# Patient Record
Sex: Female | Born: 1935 | Race: White | Hispanic: No | Smoking: Former smoker
Health system: Southern US, Community
[De-identification: ages and names within clinical notes are randomized; demographics above are authoritative.]

## PROBLEM LIST (undated history)

## (undated) DIAGNOSIS — K449 Diaphragmatic hernia without obstruction or gangrene: Secondary | ICD-10-CM

## (undated) DIAGNOSIS — I714 Abdominal aortic aneurysm, without rupture, unspecified: Secondary | ICD-10-CM

## (undated) DIAGNOSIS — H269 Unspecified cataract: Secondary | ICD-10-CM

## (undated) DIAGNOSIS — J449 Chronic obstructive pulmonary disease, unspecified: Secondary | ICD-10-CM

## (undated) DIAGNOSIS — I10 Essential (primary) hypertension: Secondary | ICD-10-CM

## (undated) DIAGNOSIS — Z8719 Personal history of other diseases of the digestive system: Secondary | ICD-10-CM

## (undated) DIAGNOSIS — M810 Age-related osteoporosis without current pathological fracture: Secondary | ICD-10-CM

## (undated) DIAGNOSIS — I739 Peripheral vascular disease, unspecified: Secondary | ICD-10-CM

## (undated) DIAGNOSIS — I2510 Atherosclerotic heart disease of native coronary artery without angina pectoris: Secondary | ICD-10-CM

## (undated) DIAGNOSIS — W19XXXA Unspecified fall, initial encounter: Secondary | ICD-10-CM

## (undated) DIAGNOSIS — C3490 Malignant neoplasm of unspecified part of unspecified bronchus or lung: Secondary | ICD-10-CM

## (undated) DIAGNOSIS — K219 Gastro-esophageal reflux disease without esophagitis: Secondary | ICD-10-CM

## (undated) DIAGNOSIS — Z5111 Encounter for antineoplastic chemotherapy: Secondary | ICD-10-CM

## (undated) HISTORY — DX: Unspecified fall, initial encounter: W19.XXXA

## (undated) HISTORY — DX: Encounter for antineoplastic chemotherapy: Z51.11

## (undated) HISTORY — DX: Gastro-esophageal reflux disease without esophagitis: K21.9

## (undated) HISTORY — DX: Abdominal aortic aneurysm, without rupture, unspecified: I71.40

## (undated) HISTORY — DX: Essential (primary) hypertension: I10

## (undated) HISTORY — DX: Unspecified cataract: H26.9

## (undated) HISTORY — PX: CATARACT EXTRACTION: SUR2

## (undated) HISTORY — PX: COLONOSCOPY: SHX174

## (undated) HISTORY — PX: ESOPHAGOGASTRODUODENOSCOPY (EGD) WITH ESOPHAGEAL DILATION: SHX5812

## (undated) HISTORY — DX: Diaphragmatic hernia without obstruction or gangrene: K44.9

## (undated) HISTORY — DX: Age-related osteoporosis without current pathological fracture: M81.0

## (undated) HISTORY — PX: EYE SURGERY: SHX253

## (undated) HISTORY — DX: Personal history of other diseases of the digestive system: Z87.19

## (undated) HISTORY — DX: Abdominal aortic aneurysm, without rupture: I71.4

## (undated) HISTORY — DX: Peripheral vascular disease, unspecified: I73.9

---

## 2012-08-30 ENCOUNTER — Other Ambulatory Visit (HOSPITAL_COMMUNITY): Payer: Self-pay | Admitting: Gastroenterology

## 2012-08-30 DIAGNOSIS — R1310 Dysphagia, unspecified: Secondary | ICD-10-CM

## 2012-09-11 ENCOUNTER — Ambulatory Visit (HOSPITAL_COMMUNITY)
Admission: RE | Admit: 2012-09-11 | Discharge: 2012-09-11 | Disposition: A | Discharge status: Home / Self Care | Payer: Medicare Other | Source: Ambulatory Visit | Attending: Gastroenterology | Admitting: Gastroenterology

## 2012-09-11 ENCOUNTER — Other Ambulatory Visit (HOSPITAL_COMMUNITY): Payer: Self-pay | Admitting: Gastroenterology

## 2012-09-11 DIAGNOSIS — R1310 Dysphagia, unspecified: Secondary | ICD-10-CM

## 2012-09-11 NOTE — Procedures (Signed)
Objective Swallowing Evaluation: Modified Barium Swallowing Study  Patient Details  Name: Carol [name] MRN: [id number] Date of Birth: 1932/07/09  Today's Date: 09/11/2012 Time: 1305-1340 SLP Time Calculation (min): 35 min  Past Medical History: No past medical history on file. Past Surgical History: No past surgical history on file. HPI:  78 yo recently moved from [place] to live with her daughter, referred by Dr Bosie Clos for MBS due to pt coughing during or after meals possibly indicating aspiration.  Pt PMH + for HTN, smoking (1/2 PPD currently).  Per documentation from GI, pt reports problems swallowing foods - (twice with steak) causing gagging and choking.  Pt denies ever requiring heimlich manuever.  Pt denies weight loss, pulmonary infections or GERD symptoms as well.  She does report dentures are ill fitting and she needs them refitted.     Assessment / Plan / Recommendation Clinical Impression  Dysphagia Diagnosis: Mild pharyngeal phase dysphagia Clinical impression: Pt presents with minimal oropharyngeal dysphagia without aspiration of any consistency tested.  Minimal sensorimotor deficits in pharynx noted resulting in intermittent trace laryngeal penetration of thin due to minimally decr laryngeal elevation/closure.  Cued throat clear removed trace penetrates and chin tuck eliminated penetration.   Chin tuck posture may be beneficial in future if pt notes overtly coughing with liquids.  Pharyngeal swallow was strong without resdiuals.  Minimal thin oral residue spilled into pharynx without reflexive swallow- clearing with cued swallow.  Rec regular/thin diet with general asp and esophageal precautions.  Xerostomia tips provided as pt reports occasional dry mouth - especially at night.  Pt did not cough once during entire evaluation.  Thanks for this referral.      Treatment Recommendation  No treatment recommended at this time    Diet Recommendation Thin liquid;Regular   Liquid  Administration via: Cup Medication Administration:  (as tolerated) Supervision: Patient able to self feed Compensations: Slow rate;Small sips/bites;Clear throat intermittently (intermittent dry swallow-) Postural Changes and/or Swallow Maneuvers: Upright 30-60 min after meal;Seated upright 90 degrees    Other  Recommendations Oral Care Recommendations: Oral care BID   Follow Up Recommendations  None    Frequency and Duration        Pertinent Vitals/Pain N/a, pt ambulating well, on room air    SLP Swallow Goals     General Date of Onset: 09/11/12 HPI: 78 yo recently moved from [place] to live with her daughter, referred by Dr Bosie Clos for MBS due to pt coughing during or after meals possibly indicating aspiration.  Pt PMH + for HTN, smoking (1/2 PPD currently).  Per documentation from GI, pt reports problems swallowing foods - (twice with steak) causing gagging and choking.  Pt denies ever requiring heimlich manuever.  Pt denies weight loss or pulmonary infections.   Type of Study: Modified Barium Swallowing Study Reason for Referral: Objectively evaluate swallowing function Diet Prior to this Study: Regular;Thin liquids Temperature Spikes Noted: No Respiratory Status: Room air History of Recent Intubation: No Behavior/Cognition: Alert;Cooperative;Pleasant mood Oral Cavity - Dentition: Dentures, top;Dentures, bottom Oral Motor / Sensory Function: Within functional limits (oral cavity was mildly erythemic) Self-Feeding Abilities: Able to feed self Patient Positioning: Upright in chair Baseline Vocal Quality: Clear Volitional Cough: Strong Volitional Swallow: Able to elicit Anatomy: Within functional limits Pharyngeal Secretions: Not observed secondary MBS    Reason for Referral Objectively evaluate swallowing function   Oral Phase Oral Preparation/Oral Phase Oral Phase: Impaired Oral Phase - Comment Oral Phase - Comment: trace oral liquid resdiuals prematurely spilled into  pharynx without pt awareness - cued dry swallows cleared-    Pharyngeal Phase Pharyngeal Phase Pharyngeal Phase: Impaired Pharyngeal - Nectar Pharyngeal - Nectar Cup: Within functional limits Pharyngeal - Thin Pharyngeal - Thin Cup: Penetration/Aspiration during swallow;Within functional limits (premature spill of oral stasis into pharynx without pt aware) Penetration/Aspiration details (thin cup): Material enters airway, CONTACTS cords and not ejected out Pharyngeal - Solids Pharyngeal - Puree: Within functional limits Pharyngeal - Regular: Within functional limits Pharyngeal Phase - Comment Pharyngeal Comment: trace intermittent laryngeal penetration of thin noted that cleared with cued throat clear/cough, chin tuck prevented laryngeal penetration fully - but does not appear necessary to conduct routinely at this time  Cervical Esophageal Phase    GO    Cervical Esophageal Phase Cervical Esophageal Phase: [facility]    Functional Assessment Tool Used: MBS with clinical judgement Functional Limitations: Swallowing Swallow Current Status ([id number]): At least 1 percent but less than 20 percent impaired, limited or restricted Swallow Goal Status [phone]): At least 1 percent but less than 20 percent impaired, limited or restricted Swallow Discharge Status [phone]): At least 1 percent but less than 20 percent impaired, limited or restricted    Donavan Burnet, MS [facility] SLP [phone]

## 2012-09-13 ENCOUNTER — Other Ambulatory Visit (HOSPITAL_COMMUNITY): Payer: Self-pay

## 2016-01-29 ENCOUNTER — Emergency Department (HOSPITAL_COMMUNITY): Payer: Medicare Other

## 2016-01-29 ENCOUNTER — Encounter (HOSPITAL_COMMUNITY): Payer: Self-pay | Admitting: Emergency Medicine

## 2016-01-29 ENCOUNTER — Inpatient Hospital Stay (HOSPITAL_COMMUNITY)
Admission: EM | Admit: 2016-01-29 | Discharge: 2016-01-31 | DRG: 683 | Disposition: A | Discharge status: Home / Self Care | Payer: Medicare Other | Source: Ambulatory Visit | Attending: Internal Medicine | Admitting: Internal Medicine

## 2016-01-29 DIAGNOSIS — I714 Abdominal aortic aneurysm, without rupture, unspecified: Secondary | ICD-10-CM | POA: Diagnosis present

## 2016-01-29 DIAGNOSIS — E871 Hypo-osmolality and hyponatremia: Secondary | ICD-10-CM | POA: Diagnosis present

## 2016-01-29 DIAGNOSIS — N179 Acute kidney failure, unspecified: Secondary | ICD-10-CM | POA: Diagnosis present

## 2016-01-29 DIAGNOSIS — R109 Unspecified abdominal pain: Secondary | ICD-10-CM | POA: Diagnosis present

## 2016-01-29 DIAGNOSIS — R9389 Abnormal findings on diagnostic imaging of other specified body structures: Secondary | ICD-10-CM

## 2016-01-29 DIAGNOSIS — K529 Noninfective gastroenteritis and colitis, unspecified: Secondary | ICD-10-CM | POA: Diagnosis present

## 2016-01-29 DIAGNOSIS — R9431 Abnormal electrocardiogram [ECG] [EKG]: Secondary | ICD-10-CM | POA: Diagnosis present

## 2016-01-29 DIAGNOSIS — I513 Intracardiac thrombosis, not elsewhere classified: Secondary | ICD-10-CM | POA: Diagnosis present

## 2016-01-29 DIAGNOSIS — R918 Other nonspecific abnormal finding of lung field: Secondary | ICD-10-CM | POA: Diagnosis present

## 2016-01-29 DIAGNOSIS — F17200 Nicotine dependence, unspecified, uncomplicated: Secondary | ICD-10-CM | POA: Diagnosis present

## 2016-01-29 DIAGNOSIS — I10 Essential (primary) hypertension: Secondary | ICD-10-CM | POA: Diagnosis present

## 2016-01-29 DIAGNOSIS — I350 Nonrheumatic aortic (valve) stenosis: Secondary | ICD-10-CM | POA: Diagnosis present

## 2016-01-29 DIAGNOSIS — R0902 Hypoxemia: Secondary | ICD-10-CM | POA: Diagnosis present

## 2016-01-29 DIAGNOSIS — D649 Anemia, unspecified: Secondary | ICD-10-CM | POA: Diagnosis present

## 2016-01-29 DIAGNOSIS — I959 Hypotension, unspecified: Secondary | ICD-10-CM | POA: Diagnosis present

## 2016-01-29 DIAGNOSIS — W1830XA Fall on same level, unspecified, initial encounter: Secondary | ICD-10-CM | POA: Diagnosis present

## 2016-01-29 DIAGNOSIS — Z7982 Long term (current) use of aspirin: Secondary | ICD-10-CM

## 2016-01-29 DIAGNOSIS — J449 Chronic obstructive pulmonary disease, unspecified: Secondary | ICD-10-CM | POA: Diagnosis present

## 2016-01-29 DIAGNOSIS — M533 Sacrococcygeal disorders, not elsewhere classified: Secondary | ICD-10-CM | POA: Diagnosis present

## 2016-01-29 DIAGNOSIS — N281 Cyst of kidney, acquired: Secondary | ICD-10-CM | POA: Diagnosis present

## 2016-01-29 DIAGNOSIS — E860 Dehydration: Secondary | ICD-10-CM | POA: Diagnosis present

## 2016-01-29 DIAGNOSIS — M545 Low back pain, unspecified: Secondary | ICD-10-CM

## 2016-01-29 DIAGNOSIS — E785 Hyperlipidemia, unspecified: Secondary | ICD-10-CM | POA: Diagnosis present

## 2016-01-29 DIAGNOSIS — W19XXXA Unspecified fall, initial encounter: Secondary | ICD-10-CM

## 2016-01-29 HISTORY — DX: Chronic obstructive pulmonary disease, unspecified: J44.9

## 2016-01-29 LAB — COMPREHENSIVE METABOLIC PANEL
ALT: 15 U/L (ref 14–54)
AST: 23 U/L (ref 15–41)
Albumin: 3.6 g/dL (ref 3.5–5.0)
Alkaline Phosphatase: 94 U/L (ref 38–126)
Anion gap: 17 — ABNORMAL HIGH (ref 5–15)
BUN: 45 mg/dL — ABNORMAL HIGH (ref 6–20)
CO2: 22 mmol/L (ref 22–32)
Calcium: 9.4 mg/dL (ref 8.9–10.3)
Chloride: 94 mmol/L — ABNORMAL LOW (ref 101–111)
Creatinine, Ser: 4.14 mg/dL — ABNORMAL HIGH (ref 0.44–1.00)
GFR calc Af Amer: 11 mL/min — ABNORMAL LOW (ref >60)
GFR calc non Af Amer: 9 mL/min — ABNORMAL LOW (ref >60)
Glucose, Bld: 100 mg/dL — ABNORMAL HIGH (ref 65–99)
Potassium: 4 mmol/L (ref 3.5–5.1)
Sodium: 133 mmol/L — ABNORMAL LOW (ref 135–145)
Total Bilirubin: 0.5 mg/dL (ref 0.3–1.2)
Total Protein: 6.9 g/dL (ref 6.5–8.1)

## 2016-01-29 LAB — URINALYSIS, ROUTINE W REFLEX MICROSCOPIC
Glucose, UA: NEGATIVE mg/dL
Hgb urine dipstick: NEGATIVE
Ketones, ur: 15 mg/dL — AB
Nitrite: NEGATIVE
Protein, ur: 30 mg/dL — AB
Specific Gravity, Urine: 1.046 — ABNORMAL HIGH (ref 1.005–1.030)
pH: 5 (ref 5.0–8.0)

## 2016-01-29 LAB — CBC
HCT: 44.4 % (ref 36.0–46.0)
Hemoglobin: 14.6 g/dL (ref 12.0–15.0)
MCH: 29.2 pg (ref 26.0–34.0)
MCHC: 32.9 g/dL (ref 30.0–36.0)
MCV: 88.8 fL (ref 78.0–100.0)
Platelets: 308 10*3/uL (ref 150–400)
RBC: 5 MIL/uL (ref 3.87–5.11)
RDW: 15 % (ref 11.5–15.5)
WBC: 10.1 10*3/uL (ref 4.0–10.5)

## 2016-01-29 LAB — URINE MICROSCOPIC-ADD ON

## 2016-01-29 LAB — LIPASE, BLOOD: Lipase: 28 U/L (ref 11–51)

## 2016-01-29 MED ORDER — SODIUM CHLORIDE 0.9 % IV BOLUS (SEPSIS)
1000.0000 mL | Freq: Once | INTRAVENOUS | Status: AC
Start: 2016-01-29 — End: 2016-01-30
  Administered 2016-01-30: 1000 mL via INTRAVENOUS

## 2016-01-29 MED ORDER — ACETAMINOPHEN 325 MG PO TABS
650.0000 mg | ORAL_TABLET | Freq: Once | ORAL | Status: AC
  Administered 2016-01-29: 650 mg via ORAL
  Filled 2016-01-29: qty 2

## 2016-01-29 MED ORDER — SODIUM CHLORIDE 0.9 % IV SOLN
Freq: Once | INTRAVENOUS | Status: AC
  Administered 2016-01-29: 22:00:00 via INTRAVENOUS

## 2016-01-29 NOTE — ED Notes (Signed)
Attempted to call report x 1  

## 2016-01-29 NOTE — H&P (Signed)
[facility] History and Physical  Priyanka Causey BZJ:[id number] DOB: Jan 10, 1932 DOA: 01/29/2016  Referring physician: Quintella Reichert, MD PCP: No primary care provider on file.   Chief Complaint: Abnormal CT scan report.  HPI: Carol [name] is a 82 y.o. female with a past medical history of COPD, hyperlipidemia, hypertension who comes to the emergency department referred by her PCP for further evaluation of abnormal CT scan report.   The patient was seen out at [facility] urgent care for abdominal pain about 4 days ago and had CT scan of the abdomen with IV contrast. She now reports being oliguric since yesterday and noticed that her urine was bloody. She denies any additional symptoms of abdominal pain, diarrhea or constipation. She denies fever, but complains of chills, weakness, fatigue and had a fall yesterday without any significant trauma. Her daughter and son-in-law earlier had the CT scan report, that per Dr. Ralene Bathe demonstrated an hour to aneurysm and a lung mass. However, was unable to see this reports since the family members to get home with them.  Workup in the emergency department shows mild hyponatremia at 133 mmol per liter, BUN of 45 and creatinine level of 4.14. When seen, the patient was in no acute distress.   Review of Systems:  Constitutional:  Positive for chills, fatigue. No weight loss, night sweats, Fevers  HEENT:  No headaches, Difficulty swallowing,Tooth/dental problems,Sore throat,  No sneezing, itching, ear ache, nasal congestion, post nasal drip,  Cardio-vascular:  No chest pain, Orthopnea, PND, swelling in lower extremities, anasarca, dizziness, palpitations  GI:  Positive abdominal pain, nausea, vomiting, diarrhea earlier this week. No heartburn, indigestion,  change in bowel habits, loss of appetite  Resp:  Occasional dyspnea, wheezing and productive cough. No hemoptysis. Skin:  No rash or lesions.  GU:  no dysuria, change in color  of urine, no urgency or frequency. No flank pain.  Musculoskeletal:  No joint pain or swelling. No decreased range of motion. No back pain.  Psych:  No change in mood or affect. No depression or anxiety. No memory loss.   Past Medical History  Diagnosis Date  . COPD (chronic obstructive pulmonary disease) ([place])    History reviewed. No pertinent past surgical history. Social History:  reports that she has been smoking.  She does not have any smokeless tobacco history on file. She reports that she drinks alcohol. She reports that she does not use illicit drugs.  No Known Allergies  History reviewed. No pertinent family history.   Prior to Admission medications   Medication Sig Start Date End Date Taking? Authorizing Provider  albuterol (PROVENTIL HFA) 108 (90 Base) MCG/ACT inhaler Inhale 2 puffs into the lungs 2 (two) times daily as needed for shortness of breath.  10/19/15 10/18/16 Yes Historical Provider, MD  amLODipine (NORVASC) 10 MG tablet Take 10 mg by mouth daily. 10/05/15 10/04/16 Yes Historical Provider, MD  aspirin 81 MG tablet Take 81 mg by mouth daily.   Yes Historical Provider, MD  lisinopril (PRINIVIL,ZESTRIL) 20 MG tablet Take 20 mg by mouth daily. 07/09/15 07/08/16 Yes Historical Provider, MD  omeprazole (PRILOSEC OTC) 20 MG tablet Take 20 mg by mouth daily as needed (HEARTBURN).   Yes Historical Provider, MD  Sennosides (EX-LAX) 15 MG TABS Take 15 mg by mouth daily as needed (FOR CONSTIPATION).   Yes Historical Provider, MD  SYMBICORT 80-4.5 MCG/ACT inhaler Inhale 2 puffs into the lungs 2 (two) times daily as needed (FOR SOB).  12/21/15  Yes Historical Provider,  MD  atorvastatin (LIPITOR) 10 MG tablet Take 10 mg by mouth daily at 6 PM. Reported on 01/29/2016 07/09/15   Historical Provider, MD   Physical Exam: Filed Vitals:   01/29/16 2145 01/29/16 2217 01/29/16 2230 01/29/16 2300  BP: 103/64 117/63 115/67 135/64  Pulse: 78 77 73 76  Temp:      TempSrc:      Resp: '20 16 17 15   '$ SpO2: 95% 94% 95% 92%    Wt Readings from Last 3 Encounters:  No data found for Wt    General:  Appears calm and comfortable Eyes: PERRL, normal lids, irises & conjunctiva ENT: grossly normal hearing, lips & tongue. Oral mucosa is dry. Neck: no LAD, masses or thyromegaly Cardiovascular: RRR, no m/r/g. No LE edema. Telemetry: SR, no arrhythmias  Respiratory: Mild wheezing and rhonchi bilaterally. No accessory muscle use. Abdomen: soft, ntnd Skin: no rash or induration seen on limited exam. Skin looks dry. Musculoskeletal: grossly normal tone BUE/BLE Psychiatric: grossly normal mood and affect, speech fluent and appropriate Neurologic: Awake, alert, oriented 3 grossly non-focal.          Labs on Admission:  Basic Metabolic Panel:  Recent Labs Lab 01/29/16 1525  NA 133*  K 4.0  CL 94*  CO2 22  GLUCOSE 100*  BUN 45*  CREATININE 4.14*  CALCIUM 9.4   Liver Function Tests:  Recent Labs Lab 01/29/16 1525  AST 23  ALT 15  ALKPHOS 94  BILITOT 0.5  PROT 6.9  ALBUMIN 3.6    Recent Labs Lab 01/29/16 1525  LIPASE 28   CBC:  Recent Labs Lab 01/29/16 1525  WBC 10.1  HGB 14.6  HCT 44.4  MCV 88.8  PLT 308    Radiological Exams on Admission: Dg Chest 2 View  01/29/2016  CLINICAL DATA:  Right lower quadrant pain. CT at an outside institution demonstrating abdominal aortic aneurysm. Dizziness for 2 days. Fall yesterday. COPD. EXAM: CHEST  2 VIEW COMPARISON:  None. FINDINGS: No prior exams available for comparison. The lungs are hyperinflated with emphysema. There is a 5.3 x 5.9 cm round masslike opacity in the left perihilar lung, likely superior segment of the left lower lobe. Heart size and mediastinal contours are normal, atherosclerosis of the thoracic aorta. No pulmonary edema or pleural effusion. No acute osseous abnormalities are seen. IMPRESSION: 1. Round 5.3 x 5.9 cm masslike opacity in the superior segment left lower lobe, concerning for primary  bronchogenic malignancy. No prior exams available for comparison. CT recommended for characterization. 2. Emphysema and hyperinflation. These results were called by telephone at the time of interpretation on 01/29/2016 at 8:29 pm to Dr. Quintella Reichert , who verbally acknowledged these results. Electronically Signed   By: Jeb Levering M.D.   On: 01/29/2016 20:30    EKG: Independently reviewed.  Vent. rate 93 BPM PR interval 208 ms QRS duration 74 ms QT/QTc 358/445 ms P-R-T axes 81 40 112 Normal sinus rhythm Right atrial enlargement Septal infarct , age undetermined T wave abnormality, consider lateral ischemia Abnormal ECG  Assessment/Plan Principal Problem:   ARF (acute renal failure) (HCC) Probably due to contrast material received during CT scan done at another facility. We will start fluid challenge. Check urine random sodium, potassium, protein, creatinine levels. Follow-up BUN/creatinine and electrolytes. Check phosphorus level. Consider consulting nephrology in the morning.  Active Problems:   COPD (chronic obstructive pulmonary disease) (HCC) Continue supplemental oxygen. Continue albuterol MDI as needed.    HTN (hypertension) Continue amlodipine 10 mg  by mouth daily. Hold lisinopril 20 mg by mouth daily.    Hyperlipidemia Continue atorvastatin 10 mg by mouth daily. Monitor LFTs periodically.    Abnormal EKG Check echocardiogram.    Lung mass Briefly discussed with the patient. Imaging reported to be brought by relatives in the morning. Consider pulmonology evaluation.    AAA (abdominal aortic aneurysm) ([place]) Patient to follow-up as an outpatient with vascular surgery for regular surveillance.   Code Status: Full code. DVT Prophylaxis: Lovenox SQ. Family Communication:  Disposition Plan: Admit to telemetry, fluid challenge, consult nephrology.  Time spent: Over 70 minutes were spent in the process of his admission.  Reubin Milan, M.D. [facility] Pager [phone].

## 2016-01-29 NOTE — ED Notes (Signed)
Pt here from PCP with eval for abd pain 4 days ago but not now; pt had CT today showing aneurysm; pt sts some blood in urine and fall yesterday

## 2016-01-29 NOTE — ED Provider Notes (Signed)
CSN: [id number]     Arrival date & time 01/29/16  1510 History   First MD Initiated Contact with Patient 01/29/16 1844     Chief Complaint  Patient presents with  . Abdominal Pain     The history is provided by the patient and a relative. No language interpreter was used.   Carol [name] is a 82 y.o. female who presents to the Emergency Department complaining of abnormal CT.  she was evaluated at [facility] urgent care for abdominal pain. Her pain started 4 days ago. She had right lower quadrant abdominal pain associated with severe vomiting. She was seen at the [facility] and had imaging performed. Since that time her pain and vomiting have completely resolved but she has not made any urine since yesterday. Yesterday she only made a small amount of urine and was bloody. She denies any fevers, diarrhea, additional symptoms. She has a history of hypertension and COPD. Family bring her in today due to CT scan report that demonstrates an aortic aneurysm and they were concerned.  Past Medical History  Diagnosis Date  . COPD (chronic obstructive pulmonary disease) ([place])    History reviewed. No pertinent past surgical history. History reviewed. No pertinent family history. Social History  Substance Use Topics  . Smoking status: Current Every Day Smoker  . Smokeless tobacco: None  . Alcohol Use: Yes   OB History    No data available     Review of Systems  All other systems reviewed and are negative.     Allergies  Review of patient's allergies indicates no known allergies.  Home Medications   Prior to Admission medications   Medication Sig Start Date End Date Taking? Authorizing Provider  albuterol (PROVENTIL HFA) 108 (90 Base) MCG/ACT inhaler Inhale 2 puffs into the lungs 2 (two) times daily as needed for shortness of breath.  10/19/15 10/18/16 Yes Historical Provider, MD  amLODipine (NORVASC) 10 MG tablet Take 10 mg by mouth daily. 10/05/15 10/04/16 Yes Historical  Provider, MD  aspirin 81 MG tablet Take 81 mg by mouth daily.   Yes Historical Provider, MD  lisinopril (PRINIVIL,ZESTRIL) 20 MG tablet Take 20 mg by mouth daily. 07/09/15 07/08/16 Yes Historical Provider, MD  omeprazole (PRILOSEC OTC) 20 MG tablet Take 20 mg by mouth daily as needed (HEARTBURN).   Yes Historical Provider, MD  Sennosides (EX-LAX) 15 MG TABS Take 15 mg by mouth daily as needed (FOR CONSTIPATION).   Yes Historical Provider, MD  SYMBICORT 80-4.5 MCG/ACT inhaler Inhale 2 puffs into the lungs 2 (two) times daily as needed (FOR SOB).  12/21/15  Yes Historical Provider, MD  atorvastatin (LIPITOR) 10 MG tablet Take 10 mg by mouth daily at 6 PM. Reported on 01/29/2016 07/09/15   Historical Provider, MD   BP 135/64 mmHg  Pulse 76  Temp(Src) 97.5 F (36.4 C) (Oral)  Resp 15  SpO2 92% Physical Exam  Constitutional: She is oriented to person, place, and time. She appears well-developed and well-nourished.  HENT:  Head: Normocephalic and atraumatic.  Cardiovascular: Normal rate and regular rhythm.   No murmur heard. Pulmonary/Chest: Effort normal and breath sounds normal. No respiratory distress.  Abdominal: Soft. There is no tenderness. There is no rebound and no guarding.  Musculoskeletal: She exhibits no edema or tenderness.  Neurological: She is alert and oriented to person, place, and time.  Skin: Skin is warm and dry.  Psychiatric: She has a normal mood and affect. Her behavior is normal.  Nursing note  and vitals reviewed.   ED Course  Procedures (including critical care time) Labs Review Labs Reviewed  COMPREHENSIVE METABOLIC PANEL - Abnormal; Notable for the following:    Sodium 133 (*)    Chloride 94 (*)    Glucose, Bld 100 (*)    BUN 45 (*)    Creatinine, Ser 4.14 (*)    GFR calc non Af Amer 9 (*)    GFR calc Af Amer 11 (*)    Anion gap 17 (*)    All other components within normal limits  URINALYSIS, ROUTINE W REFLEX MICROSCOPIC (NOT AT [facility]) - Abnormal; Notable for  the following:    Color, Urine AMBER (*)    APPearance CLOUDY (*)    Specific Gravity, Urine 1.046 (*)    Bilirubin Urine MODERATE (*)    Ketones, ur 15 (*)    Protein, ur 30 (*)    Leukocytes, UA TRACE (*)    All other components within normal limits  URINE MICROSCOPIC-ADD ON - Abnormal; Notable for the following:    Squamous Epithelial / LPF 0-5 (*)    Bacteria, UA FEW (*)    Casts HYALINE CASTS (*)    All other components within normal limits  URINE CULTURE  LIPASE, BLOOD  CBC    Imaging Review Dg Chest 2 View  01/29/2016  CLINICAL DATA:  Right lower quadrant pain. CT at an outside institution demonstrating abdominal aortic aneurysm. Dizziness for 2 days. Fall yesterday. COPD. EXAM: CHEST  2 VIEW COMPARISON:  None. FINDINGS: No prior exams available for comparison. The lungs are hyperinflated with emphysema. There is a 5.3 x 5.9 cm round masslike opacity in the left perihilar lung, likely superior segment of the left lower lobe. Heart size and mediastinal contours are normal, atherosclerosis of the thoracic aorta. No pulmonary edema or pleural effusion. No acute osseous abnormalities are seen. IMPRESSION: 1. Round 5.3 x 5.9 cm masslike opacity in the superior segment left lower lobe, concerning for primary bronchogenic malignancy. No prior exams available for comparison. CT recommended for characterization. 2. Emphysema and hyperinflation. These results were called by telephone at the time of interpretation on 01/29/2016 at 8:29 pm to Dr. Quintella Reichert , who verbally acknowledged these results. Electronically Signed   By: Jeb Levering M.D.   On: 01/29/2016 20:30   I have personally reviewed and evaluated these images and lab results as part of my medical decision-making.   EKG Interpretation   Date/Time:  Friday January 29 2016 15:22:40 EST Ventricular Rate:  93 PR Interval:  208 QRS Duration: 74 QT Interval:  358 QTC Calculation: 445 R Axis:   40 Text Interpretation:   Normal sinus rhythm Right atrial enlargement Septal  infarct , age undetermined T wave abnormality, consider lateral ischemia  Abnormal ECG Confirmed by Hazle Coca [phone]) on 01/29/2016 6:45:22 PM      MDM   Final diagnoses:  Acute renal failure, unspecified acute renal failure type [facility])    Reviewed records from [facility]. Chest x-ray on 01/26/16 with left hilar mass. CT abdomen and pelvis with and without IV contrast on 01/27/16. Infrarenal abdominal aortic aneurysm that is 4.3 cm x 4.2 cm with extensive mural thrombus. Length of aneurysm is 5.6 cm. No renal mass or renal obstruction. Right upper pole renal cyst 1.7 cm in maximum dimension. Right mid to lower pole renal cyst with a maximum dimension of 1.2 cm. The left kidney is markedly small in size most likely reflecting postischemic or postobstructive atrophy.  No retroperitoneal mass or adenopathy.  Patient here for abnormal CT scan demonstrating abdominal aortic aneurysm after recent illness with lower abdominal pain and vomiting. Her pain and vomiting have resolved. She is noted that she has decreased urinary output. BMP demonstrates acute renal failure with a creatinine of 4. Foley catheter was placed and only 5 mL of urine were returned. Providing IV fluid hydration with plan to admit for further evaluation. Chest x-ray obtained given her acute renal failure. Chest x-ray demonstrates a mass that is concerning for malignancy. Discussed this finding with the patient and her family.   Quintella Reichert, MD 01/29/16 [phone]

## 2016-01-29 NOTE — ED Notes (Signed)
Pt was seen at [facility] for RLQ abd pain, had CT which showed aortic aneurysm as well as postischemic L kidney and R renal cysts. Pt here for dx of AA

## 2016-01-30 ENCOUNTER — Inpatient Hospital Stay (HOSPITAL_COMMUNITY): Payer: Medicare Other

## 2016-01-30 DIAGNOSIS — E785 Hyperlipidemia, unspecified: Secondary | ICD-10-CM

## 2016-01-30 DIAGNOSIS — R9431 Abnormal electrocardiogram [ECG] [EKG]: Secondary | ICD-10-CM

## 2016-01-30 LAB — CBC WITH DIFFERENTIAL/PLATELET
BASOS PCT: 1 %
Basophils Absolute: 0 10*3/uL (ref 0.0–0.1)
Eosinophils Absolute: 0.2 10*3/uL (ref 0.0–0.7)
Eosinophils Relative: 4 %
HEMATOCRIT: 37.8 % (ref 36.0–46.0)
HEMOGLOBIN: 12.2 g/dL (ref 12.0–15.0)
LYMPHS ABS: 1.4 10*3/uL (ref 0.7–4.0)
Lymphocytes Relative: 26 %
MCH: 28.8 pg (ref 26.0–34.0)
MCHC: 32.3 g/dL (ref 30.0–36.0)
MCV: 89.2 fL (ref 78.0–100.0)
MONO ABS: 0.7 10*3/uL (ref 0.1–1.0)
MONOS PCT: 12 %
NEUTROS ABS: 3.2 10*3/uL (ref 1.7–7.7)
Neutrophils Relative %: 57 %
Platelets: 250 10*3/uL (ref 150–400)
RBC: 4.24 MIL/uL (ref 3.87–5.11)
RDW: 15.2 % (ref 11.5–15.5)
WBC: 5.5 10*3/uL (ref 4.0–10.5)

## 2016-01-30 LAB — COMPREHENSIVE METABOLIC PANEL
ALBUMIN: 2.7 g/dL — AB (ref 3.5–5.0)
ALK PHOS: 78 U/L (ref 38–126)
ALT: 14 U/L (ref 14–54)
ANION GAP: 12 (ref 5–15)
AST: 16 U/L (ref 15–41)
BUN: 45 mg/dL — ABNORMAL HIGH (ref 6–20)
CALCIUM: 7.6 mg/dL — AB (ref 8.9–10.3)
CHLORIDE: 102 mmol/L (ref 101–111)
CO2: 20 mmol/L — AB (ref 22–32)
CREATININE: 3.73 mg/dL — AB (ref 0.44–1.00)
GFR calc Af Amer: 12 mL/min — ABNORMAL LOW (ref >60)
GFR calc non Af Amer: 10 mL/min — ABNORMAL LOW (ref >60)
GLUCOSE: 76 mg/dL (ref 65–99)
Potassium: 3.5 mmol/L (ref 3.5–5.1)
SODIUM: 134 mmol/L — AB (ref 135–145)
Total Bilirubin: 0.4 mg/dL (ref 0.3–1.2)
Total Protein: 5.3 g/dL — ABNORMAL LOW (ref 6.5–8.1)

## 2016-01-30 LAB — PHOSPHORUS: PHOSPHORUS: 6 mg/dL — AB (ref 2.5–4.6)

## 2016-01-30 LAB — PROTEIN, URINE, RANDOM: TOTAL PROTEIN, URINE: 85 mg/dL

## 2016-01-30 LAB — PROTIME-INR
INR: 1.16 (ref 0.00–1.49)
Prothrombin Time: 15 seconds (ref 11.6–15.2)

## 2016-01-30 LAB — MAGNESIUM: Magnesium: 2.1 mg/dL (ref 1.7–2.4)

## 2016-01-30 LAB — NA AND K (SODIUM & POTASSIUM), RAND UR
POTASSIUM UR: 51 mmol/L
Sodium, Ur: 15 mmol/L

## 2016-01-30 LAB — CREATININE, URINE, RANDOM: Creatinine, Urine: 297.94 mg/dL

## 2016-01-30 LAB — APTT: aPTT: 29 seconds (ref 24–37)

## 2016-01-30 LAB — CHLORIDE, URINE, RANDOM: Chloride Urine: <15 mmol/L

## 2016-01-30 MED ORDER — ALBUTEROL SULFATE (2.5 MG/3ML) 0.083% IN NEBU: 3.0000 mL | INHALATION_SOLUTION | Freq: Two times a day (BID) | RESPIRATORY_TRACT | Status: DC | PRN

## 2016-01-30 MED ORDER — MOMETASONE FURO-FORMOTEROL FUM 100-5 MCG/ACT IN AERO
2.0000 | INHALATION_SPRAY | Freq: Two times a day (BID) | RESPIRATORY_TRACT | Status: DC
  Administered 2016-01-30 – 2016-01-31 (×3): 2 via RESPIRATORY_TRACT
  Filled 2016-01-30: qty 8.8

## 2016-01-30 MED ORDER — ASPIRIN 81 MG PO CHEW
81.0000 mg | CHEWABLE_TABLET | Freq: Every day | ORAL | Status: DC
  Administered 2016-01-30 – 2016-01-31 (×2): 81 mg via ORAL
  Filled 2016-01-30 (×2): qty 1

## 2016-01-30 MED ORDER — SODIUM CHLORIDE 0.9% FLUSH
3.0000 mL | Freq: Two times a day (BID) | INTRAVENOUS | Status: DC
  Administered 2016-01-30 – 2016-01-31 (×2): 3 mL via INTRAVENOUS

## 2016-01-30 MED ORDER — OXYCODONE HCL 5 MG PO TABS: 5.0000 mg | ORAL_TABLET | Freq: Four times a day (QID) | ORAL | Status: DC | PRN

## 2016-01-30 MED ORDER — ONDANSETRON HCL 4 MG/2ML IJ SOLN: 4.0000 mg | Freq: Four times a day (QID) | INTRAMUSCULAR | Status: DC | PRN

## 2016-01-30 MED ORDER — TRAMADOL HCL 50 MG PO TABS
50.0000 mg | ORAL_TABLET | Freq: Four times a day (QID) | ORAL | Status: DC | PRN
  Administered 2016-01-30 – 2016-01-31 (×4): 50 mg via ORAL
  Filled 2016-01-30 (×4): qty 1

## 2016-01-30 MED ORDER — AMLODIPINE BESYLATE 10 MG PO TABS
10.0000 mg | ORAL_TABLET | Freq: Every day | ORAL | Status: DC
Start: 2016-01-30 — End: 2016-01-30
  Filled 2016-01-30: qty 1

## 2016-01-30 MED ORDER — ENOXAPARIN SODIUM 30 MG/0.3ML ~~LOC~~ SOLN
30.0000 mg | SUBCUTANEOUS | Status: DC
  Administered 2016-01-30: 30 mg via SUBCUTANEOUS
  Filled 2016-01-30: qty 0.3

## 2016-01-30 MED ORDER — SENNA 8.6 MG PO TABS: 8.6000 mg | ORAL_TABLET | Freq: Every day | ORAL | Status: DC | PRN

## 2016-01-30 MED ORDER — ATORVASTATIN CALCIUM 10 MG PO TABS
10.0000 mg | ORAL_TABLET | Freq: Every day | ORAL | Status: DC
  Administered 2016-01-30: 10 mg via ORAL
  Filled 2016-01-30: qty 1

## 2016-01-30 MED ORDER — ONDANSETRON HCL 4 MG PO TABS: 4.0000 mg | ORAL_TABLET | Freq: Four times a day (QID) | ORAL | Status: DC | PRN

## 2016-01-30 MED ORDER — PANTOPRAZOLE SODIUM 40 MG PO TBEC: 40.0000 mg | DELAYED_RELEASE_TABLET | Freq: Every day | ORAL | Status: DC | PRN

## 2016-01-30 MED ORDER — SODIUM CHLORIDE 0.9 % IV SOLN
INTRAVENOUS | Status: AC
  Administered 2016-01-30: 04:00:00 via INTRAVENOUS

## 2016-01-30 NOTE — Progress Notes (Signed)
   01/30/16 0022  Vitals  Temp 97.9 F (36.6 C)  Temp Source Oral  BP (!) 114/59 mmHg  BP Location Right Arm  BP Method Automatic  Patient Position (if appropriate) Lying  Pulse Rate 77  Pulse Rate Source Monitor  Resp 16  Oxygen Therapy  SpO2 92 %  Height and Weight  Height '5\' 2"'$  (1.575 m)  Weight 53.751 kg (118 lb 8 oz)  Type of Scale Used Bed  Type of Weight Actual  BSA (Calculated - sq m) 1.53 sq meters  BMI (Calculated) 21.7  Weight in (lb) to have BMI = 25 136.4  Admitted pt to rm 3E13 from ED, pt alert and oriented, denied pain at this time, oriented to room, call bell placed within reach, orders carried out.

## 2016-01-30 NOTE — Progress Notes (Signed)
[facility] Progress Note  Patient: Carol [name] RSW:[id number]   PCP: No primary care provider on file. DOB: 02-08-1932   DOA: 01/29/2016   DOS: 01/30/2016   Date of Service: the patient was seen and examined on 01/30/2016  Subjective: patient complains of pain in sacral area. This is ongoing since her fall. No chest pain or shortness of breath. No nausea no vomiting. No abdominal pain. Primary presenting complaint was abdominal pain. Nutrition: tolerating oral Activity: bedridden Last BM: oprior to arrival  Assessment and Plan: 1. ARF (acute renal failure) ([place]) Renal function significantly worsened on admission but improving with IV hydration Continue with IV hydration. Check ultrasound renal. If does not improve will need nephrology consultation. Next and probably multifactorial with prerenal etiology as well as contrast-induced nephropathy  2.Sacral pain. Check x-ray. Used tramadol.  3. Fall. Etiology unclear. Probably hypotension with vasovagal. Hold antihypertensive medication and continue IV fluids  4. Pulmonary mass. Nexium patient has a large left-sided pulmonary mass. Patient at present does not have any acute respiratory complaint. Probably incidental finding. If patient does not improve tomorrow with regards to renal function and we will consult pulmonary in the hospital.otherwise the patient is requesting to be discharged as soon as possible. Therefore she is appropriate for an outpatient work up  5. AAA. Outpatient vascular follow-up.  DVT Prophylaxis: subcutaneous Heparin Nutrition: renal diet Advance goals of care discussion: full code  Brief Summary of Hospitalization:  Procedures: noen Consultants: none Antibiotics: Anti-infectives    None      Family Communication: no family was present at bedside, at the time of interview.   Disposition:  Expected discharge date: 01/31/2016 Barriers to safe discharge: improvement in renal  function   Intake/Output Summary (Last 24 hours) at 01/30/16 1439 Last data filed at 01/30/16 1400  Gross per 24 hour  Intake 1421.67 ml  Output    250 ml  Net 1171.67 ml   Filed Weights   01/30/16 0022  Weight: 53.751 kg (118 lb 8 oz)    Objective: Physical Exam: Filed Vitals:   01/30/16 0519 01/30/16 0759 01/30/16 0956 01/30/16 1230  BP: 112/65  90/50 125/78  Pulse: 75   81  Temp: 97.8 F (36.6 C)   97.7 F (36.5 C)  TempSrc: Oral   Oral  Resp: 16   18  Height:      Weight:      SpO2: 96% 86%  96%     General: Appear in mild distress, no Rash; Oral Mucosa moist. Cardiovascular: S1 and S2 Present, no Murmur, no JVD Respiratory: Bilateral Air entry present and Clear to Auscultation, no Crackles, no wheezes Abdomen: Bowel Sound present, Soft and no tenderness Extremities: no Pedal edema, no calf tenderness Neurology: Grossly no focal neuro deficit.  Data Reviewed: CBC:  Recent Labs Lab 01/29/16 1525 01/30/16 0314  WBC 10.1 5.5  NEUTROABS  --  3.2  HGB 14.6 12.2  HCT 44.4 37.8  MCV 88.8 89.2  PLT 308 093   Basic Metabolic Panel:  Recent Labs Lab 01/29/16 1525 01/30/16 0314  NA 133* 134*  K 4.0 3.5  CL 94* 102  CO2 22 20*  GLUCOSE 100* 76  BUN 45* 45*  CREATININE 4.14* 3.73*  CALCIUM 9.4 7.6*  MG  --  2.1  PHOS  --  6.0*   Liver Function Tests:  Recent Labs Lab 01/29/16 1525 01/30/16 0314  AST 23 16  ALT 15 14  ALKPHOS 94 78  BILITOT 0.5 0.4  PROT  6.9 5.3*  ALBUMIN 3.6 2.7*    Recent Labs Lab 01/29/16 1525  LIPASE 28   No results for input(s): AMMONIA in the last 168 hours.  Cardiac Enzymes: No results for input(s): CKTOTAL, CKMB, CKMBINDEX, TROPONINI in the last 168 hours.  BNP (last 3 results) No results for input(s): BNP in the last 8760 hours.  CBG: No results for input(s): GLUCAP in the last 168 hours.  No results found for this or any previous visit (from the past 240 hour(s)).   Studies: Dg Chest 2  View  01/29/2016  CLINICAL DATA:  Right lower quadrant pain. CT at an outside institution demonstrating abdominal aortic aneurysm. Dizziness for 2 days. Fall yesterday. COPD. EXAM: CHEST  2 VIEW COMPARISON:  None. FINDINGS: No prior exams available for comparison. The lungs are hyperinflated with emphysema. There is a 5.3 x 5.9 cm round masslike opacity in the left perihilar lung, likely superior segment of the left lower lobe. Heart size and mediastinal contours are normal, atherosclerosis of the thoracic aorta. No pulmonary edema or pleural effusion. No acute osseous abnormalities are seen. IMPRESSION: 1. Round 5.3 x 5.9 cm masslike opacity in the superior segment left lower lobe, concerning for primary bronchogenic malignancy. No prior exams available for comparison. CT recommended for characterization. 2. Emphysema and hyperinflation. These results were called by telephone at the time of interpretation on 01/29/2016 at 8:29 pm to Dr. Quintella Reichert , who verbally acknowledged these results. Electronically Signed   By: Jeb Levering M.D.   On: 01/29/2016 20:30     Scheduled Meds: . aspirin  81 mg Oral Daily  . atorvastatin  10 mg Oral q1800  . enoxaparin (LOVENOX) injection  30 mg Subcutaneous Q24H  . mometasone-formoterol  2 puff Inhalation BID  . sodium chloride flush  3 mL Intravenous Q12H   Continuous Infusions: . sodium chloride 125 mL/hr at 01/30/16 1130   PRN Meds: albuterol, ondansetron **OR** ondansetron (ZOFRAN) IV, pantoprazole, senna, traMADol  Time spent: 30 minutes  Author: Berle Mull, MD [facility] Pager: [phone] 01/30/2016 2:39 PM  If 7PM-7AM, please contact night-coverage at [email or web address], password [facility]

## 2016-01-30 NOTE — Progress Notes (Signed)
Pt's urine output is only 75cc since 12:30 AM. Walden Field NP made aware.

## 2016-01-31 DIAGNOSIS — I10 Essential (primary) hypertension: Secondary | ICD-10-CM

## 2016-01-31 DIAGNOSIS — R918 Other nonspecific abnormal finding of lung field: Secondary | ICD-10-CM

## 2016-01-31 DIAGNOSIS — J449 Chronic obstructive pulmonary disease, unspecified: Secondary | ICD-10-CM

## 2016-01-31 DIAGNOSIS — I714 Abdominal aortic aneurysm, without rupture: Secondary | ICD-10-CM

## 2016-01-31 DIAGNOSIS — N179 Acute kidney failure, unspecified: Principal | ICD-10-CM

## 2016-01-31 LAB — URINE CULTURE

## 2016-01-31 LAB — COMPREHENSIVE METABOLIC PANEL
ALBUMIN: 2.4 g/dL — AB (ref 3.5–5.0)
ALK PHOS: 70 U/L (ref 38–126)
ALT: 12 U/L — ABNORMAL LOW (ref 14–54)
ANION GAP: 9 (ref 5–15)
AST: 17 U/L (ref 15–41)
BILIRUBIN TOTAL: 0.3 mg/dL (ref 0.3–1.2)
BUN: 27 mg/dL — ABNORMAL HIGH (ref 6–20)
CALCIUM: 7.8 mg/dL — AB (ref 8.9–10.3)
CO2: 21 mmol/L — ABNORMAL LOW (ref 22–32)
Chloride: 106 mmol/L (ref 101–111)
Creatinine, Ser: 1.29 mg/dL — ABNORMAL HIGH (ref 0.44–1.00)
GFR calc Af Amer: 44 mL/min — ABNORMAL LOW (ref >60)
GFR, EST NON AFRICAN AMERICAN: 38 mL/min — AB (ref >60)
GLUCOSE: 85 mg/dL (ref 65–99)
Potassium: 3.8 mmol/L (ref 3.5–5.1)
Sodium: 136 mmol/L (ref 135–145)
TOTAL PROTEIN: 4.8 g/dL — AB (ref 6.5–8.1)

## 2016-01-31 LAB — CBC WITH DIFFERENTIAL/PLATELET
BASOS ABS: 0 10*3/uL (ref 0.0–0.1)
BASOS PCT: 0 %
EOS PCT: 4 %
Eosinophils Absolute: 0.2 10*3/uL (ref 0.0–0.7)
HCT: 33.8 % — ABNORMAL LOW (ref 36.0–46.0)
Hemoglobin: 10.8 g/dL — ABNORMAL LOW (ref 12.0–15.0)
Lymphocytes Relative: 32 %
Lymphs Abs: 1.7 10*3/uL (ref 0.7–4.0)
MCH: 28.9 pg (ref 26.0–34.0)
MCHC: 32 g/dL (ref 30.0–36.0)
MCV: 90.4 fL (ref 78.0–100.0)
MONO ABS: 1.1 10*3/uL — AB (ref 0.1–1.0)
Monocytes Relative: 21 %
NEUTROS ABS: 2.2 10*3/uL (ref 1.7–7.7)
Neutrophils Relative %: 43 %
PLATELETS: 215 10*3/uL (ref 150–400)
RBC: 3.74 MIL/uL — ABNORMAL LOW (ref 3.87–5.11)
RDW: 15.2 % (ref 11.5–15.5)
WBC: 5.2 10*3/uL (ref 4.0–10.5)

## 2016-01-31 LAB — PHOSPHORUS: Phosphorus: 3.1 mg/dL (ref 2.5–4.6)

## 2016-01-31 LAB — MICROALBUMIN / CREATININE URINE RATIO
Creatinine, Urine: 284.6 mg/dL
MICROALB UR: 195.6 ug/mL — AB
MICROALB/CREAT RATIO: 68.7 mg/g{creat} — AB (ref 0.0–30.0)

## 2016-01-31 MED ORDER — TRAMADOL HCL 50 MG PO TABS: 50.0000 mg | ORAL_TABLET | Freq: Four times a day (QID) | ORAL | Status: DC | PRN

## 2016-01-31 NOTE — Progress Notes (Signed)
   SATURATION QUALIFICATIONS: (This note is used to comply with regulatory documentation for home oxygen)  Patient Saturations on Room Air at Rest = 93%  Patient Saturations on Room Air while Ambulating = 86%  Patient Saturations on  Liters of oxygen while Ambulating = %  Please briefly explain why patient needs home oxygen:  Patient's O2 sats dropped to 86% with gait on room air.   Carita Pian Sanjuana Kava, [place] Pager [phone]

## 2016-01-31 NOTE — Evaluation (Signed)
Physical Therapy Evaluation Patient Details Name: Carol [name] MRN: [id number] DOB: 23-Oct-1932 Today's Date: 01/31/2016   History of Present Illness  Patient is an 82 yo female admitted 01/29/16 following fall with acute renal failure, dehydration, weakness.   PMH:  COPD, HTN  Clinical Impression  Patient presents with problems listed below.  Will benefit from acute PT to maximize functional independence and safety prior to return home with family.  Patient with balance deficit impacting gait.  Recommended HHPT f/u which patient declined.  Encouraged patient to use her cane at home for balance/safety.  Patient's O2 sats were at 93% on room air at rest.  She did have drop in O2 sats with gait on room air to 86%.  With seated rest and deep breathing, able to return to 90% within 1 minute on room air.  RN notified.    Follow Up Recommendations Supervision for mobility/OOB;Home health PT (Patient declining HHPT)    Equipment Recommendations  None recommended by PT    Recommendations for Other Services       Precautions / Restrictions Precautions Precautions: Fall Precaution Comments: fall pta - patient reports felt dizzy and fell Restrictions Weight Bearing Restrictions: No      Mobility  Bed Mobility               General bed mobility comments: OOB in chair  Transfers Overall transfer level: Needs assistance Equipment used: None Transfers: Sit to/from Stand Sit to Stand: Min guard         General transfer comment: Assist for safety/balance  Ambulation/Gait Ambulation/Gait assistance: Min guard Ambulation Distance (Feet): 110 Feet Assistive device: None Gait Pattern/deviations: Step-through pattern;Decreased stride length;Shuffle;Drifts right/left Gait velocity: decreased Gait velocity interpretation: Below normal speed for age/gender General Gait Details: Patient with slow, unsteady gait, drifting to left and right.  Patient reaches for rail and objects in  hallway for support.  O2 sats dropped to 86% during gait on room air.  Returned to 90% within 60 seconds with deep breathing in sitting on room air.  Stairs            Wheelchair Mobility    Modified Rankin (Stroke Patients Only)       Balance Overall balance assessment: Needs assistance         Standing balance support: No upper extremity supported;During functional activity Standing balance-Leahy Scale: Good Standing balance comment: Decreased balance with dynamic activities/gait.                             Pertinent Vitals/Pain Pain Assessment: No/denies pain    Home Living Family/patient expects to be discharged to:: Private residence Living Arrangements: Children (Daughter and son-in-law) Available Help at Discharge: Family;Available 24 hours/day Type of Home: House Home Access: Stairs to enter Entrance Stairs-Rails: Psychiatric nurse of Steps: 3 Home Layout: One level Home Equipment: Walker - 2 wheels;Cane - single point;Shower seat - built in      Prior Function Level of Independence: Independent         Comments: Ambulates only short distances due to LE weakness/pain and fatigue.  Does not drive.  Uses power buggy in stores     Hand Dominance        Extremity/Trunk Assessment   Upper Extremity Assessment: Overall WFL for tasks assessed           Lower Extremity Assessment: Generalized weakness         Communication   Communication:  No difficulties  Cognition Arousal/Alertness: Awake/alert Behavior During Therapy: WFL for tasks assessed/performed Overall Cognitive Status: Within Functional Limits for tasks assessed                      General Comments      Exercises        Assessment/Plan    PT Assessment Patient needs continued PT services  PT Diagnosis Abnormality of gait;Generalized weakness   PT Problem List Decreased strength;Decreased activity tolerance;Decreased  balance;Decreased mobility;Decreased knowledge of use of DME;Cardiopulmonary status limiting activity  PT Treatment Interventions DME instruction;Gait training;Functional mobility training;Therapeutic activities;Stair training;Balance training;Patient/family education   PT Goals (Current goals can be found in the Care Plan section) Acute Rehab PT Goals Patient Stated Goal: To go home PT Goal Formulation: With patient Time For Goal Achievement: 02/07/16 Potential to Achieve Goals: Good    Frequency Min 3X/week   Barriers to discharge        Co-evaluation               End of Session   Activity Tolerance: Patient limited by fatigue (Decreased O2 sats during gait) Patient left: in chair;with call bell/phone within reach Nurse Communication: Mobility status (O2 sats)         Time: 0623-7628 PT Time Calculation (min) (ACUTE ONLY): 14 min   Charges:   PT Evaluation $PT Eval Moderate Complexity: 1 Procedure     PT G CodesDespina Pole 02-22-2016, 11:50 AM Carita Pian. Sanjuana Kava, [place] Pager [phone]

## 2016-01-31 NOTE — Progress Notes (Signed)
Pt informed that a Ct chest has been ordered and home oxygen. Pt informed me that she does not wish to have either. Dr Ree Kida made aware.

## 2016-01-31 NOTE — Discharge Instructions (Signed)
Acute Kidney Injury Acute kidney injury is any condition in which there is sudden (acute) damage to the kidneys. Acute kidney injury was previously known as acute kidney failure or acute renal failure. The kidneys are two organs that lie on either side of the spine between the middle of the back and the front of the abdomen. The kidneys:  Remove wastes and extra water from the blood.   Produce important hormones. These help keep bones strong, regulate blood pressure, and help create red blood cells.   Balance the fluids and chemicals in the blood and tissues. A small amount of kidney damage may not cause problems, but a large amount of damage may make it difficult or impossible for the kidneys to work the way they should. Acute kidney injury may develop into long-lasting (chronic) kidney disease. It may also develop into a life-threatening disease called end-stage kidney disease. Acute kidney injury can get worse very quickly, so it should be treated right away. Early treatment may prevent other kidney diseases from developing. CAUSES   A problem with blood flow to the kidneys. This may be caused by:   Blood loss.   Heart disease.   Severe burns.   Liver disease.  Direct damage to the kidneys. This may be caused by:  Some medicines.   A kidney infection.   Poisoning or consuming toxic substances.   A surgical wound.   A blow to the kidney area.   A problem with urine flow. This may be caused by:   Cancer.   Kidney stones.   An enlarged prostate. SIGNS AND SYMPTOMS   Swelling (edema) of the legs, ankles, or feet.   Tiredness (lethargy).   Nausea or vomiting.   Confusion.   Problems with urination, such as:   Painful or burning feeling during urination.   Decreased urine production.   Frequent accidents in children who are potty trained.   Bloody urine.   Muscle twitches and cramps.   Shortness of breath.   Seizures.   Chest  pain or pressure. Sometimes, no symptoms are present. DIAGNOSIS Acute kidney injury may be detected and diagnosed by tests, including blood, urine, imaging, or kidney biopsy tests.  TREATMENT Treatment of acute kidney injury varies depending on the cause and severity of the kidney damage. In mild cases, no treatment may be needed. The kidneys may heal on their own. If acute kidney injury is more severe, your health care provider will treat the cause of the kidney damage, help the kidneys heal, and prevent complications from occurring. Severe cases may require a procedure to remove toxic wastes from the body (dialysis) or surgery to repair kidney damage. Surgery may involve:   Repair of a torn kidney.   Removal of an obstruction. HOME CARE INSTRUCTIONS  Follow your prescribed diet.  Take medicines only as directed by your health care provider.  Do not take any new medicines (prescription, over-the-counter, or nutritional supplements) unless approved by your health care provider. Many medicines can worsen your kidney damage or may need to have the dose adjusted.   Keep all follow-up visits as directed by your health care provider. This is important.  Observe your condition to make sure you are healing as expected. SEEK IMMEDIATE MEDICAL CARE IF:  You are feeling ill or have severe pain in the back or side.   Your symptoms return or you have new symptoms.  You have any symptoms of end-stage kidney disease. These include:   Persistent itchiness.  Loss of appetite.   Headaches.   Abnormally dark or light skin.  Numbness in the hands or feet.   Easy bruising.   Frequent hiccups.   Menstruation stops.   You have a fever.  You have increased urine production.  You have pain or bleeding when urinating. MAKE SURE YOU:   Understand these instructions.  Will watch your condition.  Will get help right away if you are not doing well or get worse.   This  information is not intended to replace advice given to you by your health care provider. Make sure you discuss any questions you have with your health care provider.   Document Released: 06/06/2011 Document Revised: 12/12/2014 Document Reviewed: 07/20/2012 Elsevier Interactive Patient Education [employer].

## 2016-01-31 NOTE — Discharge Summary (Addendum)
Physician Discharge Summary  Carol [name] XBM:[id number] DOB: 05-03-1932 DOA: 01/29/2016  PCP: No primary care provider on file.  Admit date: 01/29/2016 Discharge date: 01/31/2016  Time spent: 45 minutes  Recommendations for Outpatient Follow-up:  Patient will be discharged to home.  Patient will need to follow up with primary care provider within one week of discharge, repeat CBC and BMP.  Patient will need close follow up regarding AAA and pulmonary nodule.  Patient should continue medications as prescribed.  Patient should follow a heart healthy diet. Advised to stop smoking.  Home oxygen was recommended- patient refused at this time.   Discharge Diagnoses:  Acute kidney injury Sacral pain after fall Fall Hypoxia with ambulation Pulmonary mass AAA Essential hypertension Tobacco abuse Dehydration likely secondary to gastroenteritis  Discharge Condition: Stable  Diet recommendation: heart healthy  Filed Weights   01/30/16 0022 01/31/16 0634  Weight: 53.751 kg (118 lb 8 oz) 54.84 kg (120 lb 14.4 oz)    History of present illness:  On 01/29/2016 by Dr. Tennis Must Carol [name] is a 82 y.o. female with a past medical history of COPD, hyperlipidemia, hypertension who comes to the emergency department referred by her PCP for further evaluation of abnormal CT scan report.  The patient was seen out at [facility] urgent care for abdominal pain about 4 days ago and had CT scan of the abdomen with IV contrast. She now reports being oliguric since yesterday and noticed that her urine was bloody. She denies any additional symptoms of abdominal pain, diarrhea or constipation. She denies fever, but complains of chills, weakness, fatigue and had a fall yesterday without any significant trauma. Her daughter and son-in-law earlier had the CT scan report, that per Dr. Ralene Bathe demonstrated an hour to aneurysm and a lung mass. However, was unable to see this reports since the family  members to get home with them.  Workup in the emergency department shows mild hyponatremia at 133 mmol per liter, BUN of 45 and creatinine level of 4.14. When seen, the patient was in no acute distress.  Hospital Course:  Acute kidney injury -On admission, creatinine 4.14, currently 1.29 -Likely secondary to dehydration -Renal ultrasound: Bilateral renal cysts, no cause for acute renal failure identified, no hydronephrosis -Repeat BMP in 1 week  Sacral pain after fall -X-ray showed no acute osseous abnormality -Continue pain control  Fall  -Likely secondary to dehydration, hypotension, vasovagal -PT consulted, recommended cane for ambulation -Echocardiogram: EF 02-72%, grade 1 diastolic dysfunction, moderate aortic stenosis  Hypoxia with ambulation -Possibly due to her continued tobacco use. -Upon ambulation, oxygen saturations dropped to 86% -Patient refused home oxygen  Pulmonary mass -Seen on CXR: Around 5.3 x 5.9 mass like opacity in the superior segment left lower lobe -Patient refused CT chest  AAA -patient will need outpatient follow up  Essential hypertension -Continue amlodipine -Lisinopril held  Tobacco abuse -Patient counseled on smoking cessation  Dehydration likely secondary to gastroenteritis -Patient did have nausea and vomiting, diarrhea prior to admission. This has since resolved.  Normocytic anemia -Drop in Hb likely dilutional, repeat CBC in one week  Procedures: Renal ultrasound Echocardiogram  Consultations: None  Discharge Exam: Filed Vitals:   01/31/16 0634 01/31/16 0642  BP: 85/59 141/55  Pulse: 88 79  Temp: 98.4 F (36.9 C)   Resp: 20      General: Well developed, well nourished, NAD, appears stated age  HEENT: NCAT,mucous membranes moist.  Cardiovascular: S1 S2 auscultated, no rubs, murmurs or gallops. Regular rate  and rhythm.  Respiratory: Clear to auscultation bilaterally with equal chest rise  Abdomen: Soft,  nontender, nondistended, + bowel sounds  Extremities: warm dry without cyanosis clubbing or edema  Neuro: AAOx3, cranial nerves grossly intact. Strength 5/5 in patient's upper and lower extremities bilaterally  Skin: Without rashes exudates or nodules  Psych: Normal affect and demeanor with intact judgement and insight  Discharge Instructions      Discharge Instructions    Discharge instructions    Complete by:  As directed   Patient will be discharged to home.  Patient will need to follow up with primary care provider within one week of discharge.  Patient will need close follow up regarding AAA and pulmonary nodule.  Patient should continue medications as prescribed.  Patient should follow a heart healthy diet. Advised to stop smoking.            Medication List    STOP taking these medications        lisinopril 20 MG tablet  Commonly known as:  PRINIVIL,ZESTRIL      TAKE these medications        amLODipine 10 MG tablet  Commonly known as:  NORVASC  Take 10 mg by mouth daily.     aspirin 81 MG tablet  Take 81 mg by mouth daily.     EX-LAX 15 MG Tabs  Generic drug:  Sennosides  Take 15 mg by mouth daily as needed (FOR CONSTIPATION).     LIPITOR 10 MG tablet  Generic drug:  atorvastatin  Take 10 mg by mouth daily at 6 PM. Reported on 01/29/2016     omeprazole 20 MG tablet  Commonly known as:  PRILOSEC OTC  Take 20 mg by mouth daily as needed (HEARTBURN).     PROVENTIL HFA 108 (90 Base) MCG/ACT inhaler  Generic drug:  albuterol  Inhale 2 puffs into the lungs 2 (two) times daily as needed for shortness of breath.     SYMBICORT 80-4.5 MCG/ACT inhaler  Generic drug:  budesonide-formoterol  Inhale 2 puffs into the lungs 2 (two) times daily as needed (FOR SOB).       No Known Allergies Follow-up Information    Follow up with Primary care physician . Schedule an appointment as soon as possible for a visit in 1 week.   Why:  Establish care, hospital follow up,  repeat BMP       The results of significant diagnostics from this hospitalization (including imaging, microbiology, ancillary and laboratory) are listed below for reference.    Significant Diagnostic Studies: Dg Chest 2 View  01/29/2016  CLINICAL DATA:  Right lower quadrant pain. CT at an outside institution demonstrating abdominal aortic aneurysm. Dizziness for 2 days. Fall yesterday. COPD. EXAM: CHEST  2 VIEW COMPARISON:  None. FINDINGS: No prior exams available for comparison. The lungs are hyperinflated with emphysema. There is a 5.3 x 5.9 cm round masslike opacity in the left perihilar lung, likely superior segment of the left lower lobe. Heart size and mediastinal contours are normal, atherosclerosis of the thoracic aorta. No pulmonary edema or pleural effusion. No acute osseous abnormalities are seen. IMPRESSION: 1. Round 5.3 x 5.9 cm masslike opacity in the superior segment left lower lobe, concerning for primary bronchogenic malignancy. No prior exams available for comparison. CT recommended for characterization. 2. Emphysema and hyperinflation. These results were called by telephone at the time of interpretation on 01/29/2016 at 8:29 pm to Dr. Quintella Reichert , who verbally acknowledged these results. Electronically Signed  By: Jeb Levering M.D.   On: 01/29/2016 20:30   Dg Sacrum/coccyx  01/30/2016  CLINICAL DATA:  82 year old who fell from a standing height on a carpeted floor in her living room, falling onto her buttocks 3 days ago. Coccygeal pain. Initial encounter. EXAM: SACRUM AND COCCYX - 2+ VIEW COMPARISON:  None. FINDINGS: No fractures identified involving the sacrum or coccyx. Osseous demineralization. Sacroiliac joints intact with mild degenerative changes. Symphysis pubis intact with severe degenerative changes. Note made of hydroxyapatite deposition at the insertion of the right gluteal tendon on the right greater trochanter. IMPRESSION: 1. No acute osseous abnormality. 2.  Calcific right gluteal tendinitis at its insertion on the greater trochanter. Electronically Signed   By: Evangeline Dakin M.D.   On: 01/30/2016 14:50   US Renal  01/30/2016  CLINICAL DATA:  Acute renal failure. EXAM: RENAL / URINARY TRACT ULTRASOUND COMPLETE COMPARISON:  None. FINDINGS: Right Kidney: Length: 11.8 cm. Right renal cysts are seen. No stones or hydronephrosis. Left Kidney: Length: 8 cm. Cortical thinning is identified. There is also an 8 mm cyst. No hydronephrosis. Bladder: Appears normal for degree of bladder distention. IMPRESSION: Bilateral renal cysts. No cause for acute renal failure identified. No hydronephrosis. Electronically Signed   By: Dorise Bullion III M.D   On: 01/30/2016 18:42    Microbiology: No results found for this or any previous visit (from the past 240 hour(s)).   Labs: Basic Metabolic Panel:  Recent Labs Lab 01/29/16 1525 01/30/16 0314 01/31/16 0353  NA 133* 134* 136  K 4.0 3.5 3.8  CL 94* 102 106  CO2 22 20* 21*  GLUCOSE 100* 76 85  BUN 45* 45* 27*  CREATININE 4.14* 3.73* 1.29*  CALCIUM 9.4 7.6* 7.8*  MG  --  2.1  --   PHOS  --  6.0* 3.1   Liver Function Tests:  Recent Labs Lab 01/29/16 1525 01/30/16 0314 01/31/16 0353  AST '23 16 17  '$ ALT 15 14 12*  ALKPHOS 94 78 70  BILITOT 0.5 0.4 0.3  PROT 6.9 5.3* 4.8*  ALBUMIN 3.6 2.7* 2.4*    Recent Labs Lab 01/29/16 1525  LIPASE 28   No results for input(s): AMMONIA in the last 168 hours. CBC:  Recent Labs Lab 01/29/16 1525 01/30/16 0314 01/31/16 0353  WBC 10.1 5.5 5.2  NEUTROABS  --  3.2 2.2  HGB 14.6 12.2 10.8*  HCT 44.4 37.8 33.8*  MCV 88.8 89.2 90.4  PLT 308 250 215   Cardiac Enzymes: No results for input(s): CKTOTAL, CKMB, CKMBINDEX, TROPONINI in the last 168 hours. BNP: BNP (last 3 results) No results for input(s): BNP in the last 8760 hours.  ProBNP (last 3 results) No results for input(s): PROBNP in the last 8760 hours.  CBG: No results for input(s): GLUCAP  in the last 168 hours.     SignedCristal Ford  [facility] 01/31/2016, 1:43 PM

## 2016-02-10 ENCOUNTER — Ambulatory Visit (INDEPENDENT_AMBULATORY_CARE_PROVIDER_SITE_OTHER): Payer: Medicare Other | Admitting: Family Medicine

## 2016-02-10 ENCOUNTER — Encounter: Payer: Self-pay | Admitting: Family Medicine

## 2016-02-10 VITALS — BP 96/52 | HR 98 | Temp 98.1°F | Resp 20 | Wt 113.0 lb

## 2016-02-10 DIAGNOSIS — D649 Anemia, unspecified: Secondary | ICD-10-CM

## 2016-02-10 DIAGNOSIS — I350 Nonrheumatic aortic (valve) stenosis: Secondary | ICD-10-CM | POA: Insufficient documentation

## 2016-02-10 DIAGNOSIS — N179 Acute kidney failure, unspecified: Secondary | ICD-10-CM | POA: Diagnosis not present

## 2016-02-10 DIAGNOSIS — I714 Abdominal aortic aneurysm, without rupture, unspecified: Secondary | ICD-10-CM

## 2016-02-10 DIAGNOSIS — Z87891 Personal history of nicotine dependence: Secondary | ICD-10-CM

## 2016-02-10 DIAGNOSIS — K449 Diaphragmatic hernia without obstruction or gangrene: Secondary | ICD-10-CM | POA: Insufficient documentation

## 2016-02-10 DIAGNOSIS — R0902 Hypoxemia: Secondary | ICD-10-CM | POA: Insufficient documentation

## 2016-02-10 DIAGNOSIS — R9431 Abnormal electrocardiogram [ECG] [EKG]: Secondary | ICD-10-CM

## 2016-02-10 DIAGNOSIS — I10 Essential (primary) hypertension: Secondary | ICD-10-CM

## 2016-02-10 DIAGNOSIS — J449 Chronic obstructive pulmonary disease, unspecified: Secondary | ICD-10-CM

## 2016-02-10 DIAGNOSIS — E871 Hypo-osmolality and hyponatremia: Secondary | ICD-10-CM | POA: Diagnosis not present

## 2016-02-10 DIAGNOSIS — R918 Other nonspecific abnormal finding of lung field: Secondary | ICD-10-CM

## 2016-02-10 LAB — COMPREHENSIVE METABOLIC PANEL
ALT: 12 U/L (ref 6–29)
AST: 15 U/L (ref 10–35)
Albumin: 3.7 g/dL (ref 3.6–5.1)
Alkaline Phosphatase: 102 U/L (ref 33–130)
BUN: 7 mg/dL (ref 7–25)
CHLORIDE: 99 mmol/L (ref 98–110)
CO2: 24 mmol/L (ref 20–31)
CREATININE: 0.58 mg/dL — AB (ref 0.60–0.88)
Calcium: 8.9 mg/dL (ref 8.6–10.4)
GLUCOSE: 84 mg/dL (ref 65–99)
POTASSIUM: 3.9 mmol/L (ref 3.5–5.3)
SODIUM: 137 mmol/L (ref 135–146)
TOTAL PROTEIN: 6.8 g/dL (ref 6.1–8.1)
Total Bilirubin: 0.4 mg/dL (ref 0.2–1.2)

## 2016-02-10 LAB — CBC WITH DIFFERENTIAL/PLATELET
BASOS PCT: 0 % (ref 0–1)
Basophils Absolute: 0 10*3/uL (ref 0.0–0.1)
EOS ABS: 0.1 10*3/uL (ref 0.0–0.7)
EOS PCT: 2 % (ref 0–5)
HCT: 44 % (ref 36.0–46.0)
Hemoglobin: 14.9 g/dL (ref 12.0–15.0)
Lymphocytes Relative: 31 % (ref 12–46)
Lymphs Abs: 2.1 10*3/uL (ref 0.7–4.0)
MCH: 29.4 pg (ref 26.0–34.0)
MCHC: 33.9 g/dL (ref 30.0–36.0)
MCV: 87 fL (ref 78.0–100.0)
MONOS PCT: 14 % — AB (ref 3–12)
MPV: 9.5 fL (ref 8.6–12.4)
Monocytes Absolute: 0.9 10*3/uL (ref 0.1–1.0)
NEUTROS PCT: 53 % (ref 43–77)
Neutro Abs: 3.6 10*3/uL (ref 1.7–7.7)
PLATELETS: 452 10*3/uL — AB (ref 150–400)
RBC: 5.06 MIL/uL (ref 3.87–5.11)
RDW: 15.1 % (ref 11.5–15.5)
WBC: 6.7 10*3/uL (ref 4.0–10.5)

## 2016-02-10 LAB — TSH: TSH: 3.12 mIU/L

## 2016-02-10 MED ORDER — SYMBICORT 80-4.5 MCG/ACT IN AERO: 2.0000 | INHALATION_SPRAY | Freq: Two times a day (BID) | RESPIRATORY_TRACT | Status: DC | PRN

## 2016-02-10 MED ORDER — AMLODIPINE BESYLATE 5 MG PO TABS: 5.0000 mg | ORAL_TABLET | Freq: Every day | ORAL | Status: DC

## 2016-02-10 MED ORDER — ALBUTEROL SULFATE HFA 108 (90 BASE) MCG/ACT IN AERS: 2.0000 | INHALATION_SPRAY | Freq: Two times a day (BID) | RESPIRATORY_TRACT | Status: AC | PRN

## 2016-02-10 NOTE — Progress Notes (Signed)
Patient ID: Carol [name], female   DOB: [birth year], 82 y.o.   MRN: [id number]      Patient ID: Carol [name], female  DOB: 1932/08/10, 82 y.o.   MRN: [id number]  Subjective:  Carol [name] is a 82 y.o. female present for establish care. All past medical history, surgical history, allergies, family history, immunizations, medications and social history were obtained and entered  in the electronic medical record today. All recent labs, ED visits and hospitalizations within the last year were reviewed.  Patient has moved from [place] approximately 4 years ago and has settled in [place] with her daughter and son-in-law. Most of her history was provided by her daughter and son-in-law. It appears she had intermittent care over the last 4 years, and was recently seen at [facility] for nausea vomiting and abdominal pain. Per report from family and patient, the nausea and vomiting and abdominal pain has been recurrent over the last year. During that office visit a chest x-ray incidentally found a lung mass and she was told to report to the emergency room. From the emergency room she was found to have acute renal failure, that was felt to be secondary to dehydration and probable lisinopril use, she also had intravenous contrast for abdominal CT. During her hospital stay patient underwent a EKG, echo, renal ultrasound, labs and repeat chest x-ray. Patient was probably hyponatremic on admission with a sodium of 133, hypochloremic of 94, BUN 45 and a creatinine of 4.14. Patient was not producing urine at that time. Reacting corrected to 1.29 by time of discharge. GFR estimated around 38. Patient is thin with a low albumin of 2.4. Discharge her hemoglobin was 10.8 with hematocrit of 33.8 was felt secondary to dilution. Urine microalbumin 195.6. Lisinopril was discontinued, and patient has remained off this medication. Abdominal CT resulted with an incidental finding of a AAA. Echocardiogram resulted  with aortic stenosis. EKG was reported abnormal for possible ischemia.  She has a history of COPD and is using inhalers routinely. She had desaturations to 86% with ambulation while in the hospital. Room air while sitting is approximately 92%, and on 2 L of oxygen while in the hospital was 95%. She is a former smoker of a pack year history of at least 22. She states she quit after the above findings.  She also has a history of hypertension and when she currently takes amlodipine 10 mg 4.   Dg Chest 2 View 01/29/2016 FINDINGS: No prior exams available for comparison. The lungs are hyperinflated with emphysema. There is a 5.3 x 5.9 cm round masslike opacity in the left perihilar lung, likely superior segment of the left lower lobe. Heart size and mediastinal contours are normal, atherosclerosis of the thoracic aorta. No pulmonary edema or pleural effusion. No acute osseous abnormalities are seen. IMPRESSION: 1. Round 5.3 x 5.9 cm masslike opacity in the superior segment left lower lobe, concerning for primary bronchogenic malignancy. No prior exams available for comparison. CT recommended for characterization. 2. Emphysema and hyperinflation.    US Renal 01/30/2016  CLINICAL DATA:  Acute renal failure. EXAM: RENAL / URINARY TRACT ULTRASOUND COMPLETE COMPARISON:  None. FINDINGS: Right Kidney: Length: 11.8 cm. Right renal cysts are seen. No stones or hydronephrosis. Left Kidney: Length: 8 cm. Cortical thinning is identified. There is also an 8 mm cyst. No hydronephrosis. Bladder: Appears normal for degree of bladder distention. IMPRESSION: Bilateral renal cysts. No cause for acute renal failure identified. No hydronephrosis.  Echocardiogram the 25th 2017: Study Conclusions -  Left ventricle: The cavity size was normal. Wall thickness was  normal. Systolic function was vigorous. The estimated ejection  fraction was in the range of 65% to 70%. Wall motion was normal;  there were no regional wall motion  abnormalities. Doppler  parameters are consistent with abnormal left ventricular  relaxation (grade 1 diastolic dysfunction). Doppler parameters  are consistent with high ventricular filling pressure. - Aortic valve: Morphologically, there appears to be moderate  aortic stenosis. Hemodynamic parameters were not obtained (peak  velocity, mean gradient, valve area). Cusp separation was  reduced. - Mitral valve: Calcified annulus. There was mild regurgitation. - Left atrium: The atrium was mildly dilated.  Past Medical History  Diagnosis Date  . COPD (chronic obstructive pulmonary disease) ([place])   . Hypertension   . GERD (gastroesophageal reflux disease)   . Cataract   . Fall   . History of esophageal stricture   . Hiatal hernia    No Known Allergies Past Surgical History  Procedure Laterality Date  . Eye surgery     Family History  Problem Relation Age of Onset  . Cancer Mother   . Breast cancer Mother 30  . Early death Mother   . Alcohol abuse Father   . COPD Father   . Early death Brother     MVA   Social History   Social History  . Marital Status: Unknown    Spouse Name: N/A  . Number of Children: N/A  . Years of Education: N/A   Occupational History  . Not on file.   Social History Main Topics  . Smoking status: Former Smoker -- 1.00 packs/day for 97 years    Quit date: 02/01/2016  . Smokeless tobacco: Never Used  . Alcohol Use: 0.0 oz/week    0 Standard drinks or equivalent per week     Comment: occassinal   . Drug Use: No  . Sexual Activity: No   Other Topics Concern  . Not on file   Social History Narrative   Divorced. Recently moved to [place] from [place], and lives with her daughter and son-in-law.   HS diploma. Retired.    Former smoker: Quit February 2017, with at least 55-pack-year history.   Occasional glass of wine, no drug use.   Caffeinated beverages, takes a daily vitamin.   Wears her seatbelt. Has dentures.   Smoke  detector in the home.   Feels safe in her relationships.    ROS: Negative, with the exception of above mentioned in HPI  Objective: BP 96/52 mmHg  Pulse 98  Temp(Src) 98.1 F (36.7 C)  Resp 20  Wt 113 lb (51.256 kg)  SpO2 92% Gen: Afebrile. No acute distress. Nontoxic in appearance, well-developed, well-nourished, female, extremely pleasant, thin, Caucasian HENT: AT. [place].MMM, no oral lesions, dentures. No Cough on exam, no hoarseness on exam. Eyes: Arcus senilis present. Pupils Equal Round Reactive to light, Extraocular movements intact,  Conjunctiva without redness, discharge or icterus. Neck/lymp/endocrine: Supple, no appreciable cervical lymphadenopathy CV: RRR 1/6 systolic murmur appreciated, no edema Chest: Coarse Rales appreciated l bilateral lower lung fields and mild Rales right upper lung field. Mildly increased Respiratory effort. Diminished Air movement. Winded with sentences, mild tripoding present. Abd: Soft. Flat NTND. BS present.  Skin: Pale. Warm and well-perfused. Skin intact. Neuro/Msk:PERLA. EOMi. Alert. Oriented x3.   Psych: Normal affect, dress and demeanor. Normal speech. Normal thought content and judgment.  Assessment/plan: Carol [name] is a 82 y.o. female present for establish care after recent hospitalization  with multiple serious conditions/findings. No records were received prior other than the CT and chest x-ray from [facility]. Other records were found/reviewed after extensively searching care everywhere TAB and hospitalization records. Acute renal failure, unspecified acute renal failure type ([place]) - Appears to be secondary to dehydration, possibly lisinopril use. Will repeat CMP today. - No prior history and creatinine normal August 2016, under care everywhere TAB with a creatinine of 0.76. - Comp Met (CMET) - Discontinued lisinopril  Hyponatremia - Hyponatremia during hospitalization, likely secondary to acute illness. We'll repeat  chemistry panel today and obtain TSH. - Comp Met (CMET) - TSH  Anemia, unspecified anemia type - Possibly dilutional after receiving fluids secondary to acute kidney injury. We'll repeat CBC today. - CBC w/Diff  Abdominal aortic aneurysm (AAA) without rupture (HCC)/abnormal EKG/moderate aortic stenosis - Significant in size, Newfound infrarenal AAA, abnormal EKG in the hospital, echocardiogram with moderate aortic stenosis. Discussed referral system with the patient and her family today and they are amendable for further evaluation/monitoring. Unknown length of time patient has had AAA, therefore unable to know if this is rapidly increasing or not. Patient has just quit smoking within the last month. - She also with significant dyspnea, although she is a smoker with COPD and a newfound lung mass, she also has moderate aortic stenosis. - Ambulatory referral to Cardiology  Essential hypertension - Discontinued lisinopril, patient with low blood pressure today. Uncertain of prior compliance considering review of electronic medical records and care everywhere showed blood pressures approximately 140/90 while on amlodipine 10 mg and lisinopril 20 mg. - She reports some dizziness, but she has multiple possible etiologies for dizziness. - Decreased amlodipine to 5 mg starting today. Patient is to monitor her blood pressure at home. Her family does monitor her blood pressure for her. Ideally would want her blood pressure above 110/60, but below 140/90.  Lung mass/former smoker/hypoxia/Chronic obstructive pulmonary disease, unspecified COPD type ([place]) - Incidentally found left hilar lung mass on chest x-ray. Discussed in detail many possible etiologies with patient today and her family. Discussed there is some concern this is malignancy related. Discussed in detail with family we are unable to hypothesize further until we proceed with imaging and referral to pulmonology, will they likely need to evaluate  further with bronchoscopy/biopsy. Discussed with patient what her wishes would be, and she would like to proceed with further evaluation of lung mass. Patient was amendable to further evaluation. - Patient's sitting room oxygen saturation of 92%, with ambulation in the hospital decreased 86%. Discussed with patient plan she will likely need supplemental oxygen at least with ambulation. Discussed pulmonology will evaluate her need further. - Patient has quit smoking within the last month. - Continue current inhalers, refills provided her today. Refills on Symbicort and albuterol. - We'll obtain CT WITH contrast, once BMP has returned, and refer to pulmonology.   Follow-up depending upon labs and outpatient blood pressure readings.  Greater than 60 minutes was spent with patient, greater than 50% of that time was spent face-to-face with patient counseling and coordinating care.   Electronically signed by: [name], DO [place]

## 2016-02-10 NOTE — Patient Instructions (Signed)
It was a pleasure meeting you today! I will be in touch with follow up and labs .   BP needs to be above 110/60, Use 5 mg amlodipine daily.  Stay hydrated.  Cardiology referral and pulmonology referral placed   Pulmonary Nodule A pulmonary nodule is a small, round growth of tissue in the lung. Pulmonary nodules can range in size from less than 1/5 inch (4 mm) to a little bigger than an inch (25 mm). Most pulmonary nodules are detected when imaging tests of the lung are being performed for a different problem. Pulmonary nodules are usually not cancerous (benign). However, some pulmonary nodules are cancerous (malignant). Follow-up treatment or testing is based on the size of the pulmonary nodule and your risk of getting lung cancer.  CAUSES Benign pulmonary nodules can be caused by various things. Some of the causes include:   Bacterial, fungal, or viral infections. This is usually an old infection that is no longer active, but it can sometimes be a current, active infection.  A benign mass of tissue.  Inflammation from conditions such as rheumatoid arthritis.   Abnormal blood vessels in the lungs. Malignant pulmonary nodules can result from lung cancer or from cancers that spread to the lung from other places in the body. SIGNS AND SYMPTOMS Pulmonary nodules usually do not cause symptoms. DIAGNOSIS Most often, pulmonary nodules are found incidentally when an X-ray or CT scan is performed to look for some other problem in the lung area. To help determine whether a pulmonary nodule is benign or malignant, your health care provider will take a medical history and order a variety of tests. Tests done may include:   Blood tests.  A skin test called a tuberculin test. This test is used to determine if you have been exposed to the germ that causes tuberculosis.   Chest X-rays. If possible, a new X-ray may be compared with X-rays you have had in the past.   CT scan. This test shows smaller  pulmonary nodules more clearly than an X-ray.   Positron emission tomography (PET) scan. In this test, a safe amount of a radioactive substance is injected into the bloodstream. Then, the scan takes a picture of the pulmonary nodule. The radioactive substance is eliminated from your body in your urine.   Biopsy. A tiny piece of the pulmonary nodule is removed so it can be checked under a microscope. TREATMENT  Pulmonary nodules that are benign normally do not require any treatment because they usually do not cause symptoms or breathing problems. Your health care provider may want to monitor the pulmonary nodule through follow-up CT scans. The frequency of these CT scans will vary based on the size of the nodule and the risk factors for lung cancer. For example, CT scans will need to be done more frequently if the pulmonary nodule is larger and if you have a history of smoking and a family history of cancer. Further testing or biopsies may be done if any follow-up CT scan shows that the size of the pulmonary nodule has increased. HOME CARE INSTRUCTIONS  Only take over-the-counter or prescription medicines as directed by your health care provider.  Keep all follow-up appointments with your health care provider. SEEK MEDICAL CARE IF:  You have trouble breathing when you are active.   You feel sick or unusually tired.   You do not feel like eating.   You lose weight without trying to.   You develop chills or night sweats.  SEEK  IMMEDIATE MEDICAL CARE IF:  You cannot catch your breath, or you begin wheezing.   You cannot stop coughing.   You cough up blood.   You become dizzy or feel like you are going to pass out.   You have sudden chest pain.   You have a fever or persistent symptoms for more than 2-3 days.   You have a fever and your symptoms suddenly get worse. MAKE SURE YOU:  Understand these instructions.  Will watch your condition.  Will get help right away  if you are not doing well or get worse.   This information is not intended to replace advice given to you by your health care provider. Make sure you discuss any questions you have with your health care provider.   Document Released: 09/18/2009 Document Revised: 07/24/2013 Document Reviewed: 05/13/2013 Elsevier Interactive Patient Education [employer].

## 2016-02-11 ENCOUNTER — Telehealth: Payer: Self-pay | Admitting: Family Medicine

## 2016-02-11 DIAGNOSIS — R918 Other nonspecific abnormal finding of lung field: Secondary | ICD-10-CM

## 2016-02-11 DIAGNOSIS — Z87891 Personal history of nicotine dependence: Secondary | ICD-10-CM

## 2016-02-11 DIAGNOSIS — R0600 Dyspnea, unspecified: Secondary | ICD-10-CM

## 2016-02-11 NOTE — Telephone Encounter (Signed)
Called patient today discuss lab results. Patient's kidney function has returned to normal with a creatinine is 0.58, after acute kidney injury. Her CBC is within normal limits with the exception of elevated platelets of 452.  Her thyroid is functioning normal. Discuss all results with her son-in-law [name], permission given by patient. Also informed him that we have placed a order for CT with contrast to investigate the found lung mass on chest x-ray. She has are had her BMP completed and has normal kidney function. Once we received a CT results, if indicated will send to pulmonology. Discussed referral has also been placed for cardiology to further evaluate and follow aortic stenosis and new found infrarenal AAA. All questions were answered, family amendable to plan.  Electronically Signed by: [name], DO [facility]

## 2016-02-16 ENCOUNTER — Ambulatory Visit (HOSPITAL_BASED_OUTPATIENT_CLINIC_OR_DEPARTMENT_OTHER)
Admission: RE | Admit: 2016-02-16 | Discharge: 2016-02-16 | Disposition: A | Discharge status: Home / Self Care | Payer: Medicare Other | Source: Ambulatory Visit | Attending: Family Medicine | Admitting: Family Medicine

## 2016-02-16 ENCOUNTER — Encounter (HOSPITAL_BASED_OUTPATIENT_CLINIC_OR_DEPARTMENT_OTHER): Payer: Self-pay

## 2016-02-16 ENCOUNTER — Telehealth: Payer: Self-pay | Admitting: *Deleted

## 2016-02-16 DIAGNOSIS — Z87891 Personal history of nicotine dependence: Secondary | ICD-10-CM | POA: Insufficient documentation

## 2016-02-16 DIAGNOSIS — I700 Atherosclerosis of aorta: Secondary | ICD-10-CM | POA: Diagnosis not present

## 2016-02-16 DIAGNOSIS — R918 Other nonspecific abnormal finding of lung field: Secondary | ICD-10-CM | POA: Diagnosis present

## 2016-02-16 DIAGNOSIS — R0600 Dyspnea, unspecified: Secondary | ICD-10-CM | POA: Diagnosis not present

## 2016-02-16 DIAGNOSIS — I2510 Atherosclerotic heart disease of native coronary artery without angina pectoris: Secondary | ICD-10-CM | POA: Insufficient documentation

## 2016-02-16 MED ORDER — IOHEXOL 350 MG/ML SOLN
68.0000 mL | Freq: Once | INTRAVENOUS | Status: AC | PRN
  Administered 2016-02-16: 68 mL via INTRAVENOUS

## 2016-02-16 NOTE — Telephone Encounter (Signed)
Call report.  Chest CT - 6.5cm mass in left lower lobe.   Full results in EMR.   Please advise. Thanks.

## 2016-02-16 NOTE — Telephone Encounter (Signed)
Discussed  with patient and her son-in-law,  the results of her chest CT from today has resulted with a 6.5 cm mass in the superior segment of the left lower lobe, that is concerning for primary lung cancer. Patient quit smoking approximately 4-5 weeks ago, after mass was incidentally found on chest x-ray. Discussed moving forward with pulmonology to further eval, recommend any treatments, if desired. Patient amendable to a referral.

## 2016-02-18 ENCOUNTER — Encounter: Payer: Self-pay | Admitting: Cardiology

## 2016-02-18 ENCOUNTER — Ambulatory Visit (INDEPENDENT_AMBULATORY_CARE_PROVIDER_SITE_OTHER): Payer: Medicare Other | Admitting: Cardiology

## 2016-02-18 ENCOUNTER — Telehealth: Payer: Self-pay

## 2016-02-18 VITALS — BP 150/88 | HR 90 | Ht 64.0 in | Wt 115.4 lb

## 2016-02-18 DIAGNOSIS — I714 Abdominal aortic aneurysm, without rupture, unspecified: Secondary | ICD-10-CM

## 2016-02-18 NOTE — Progress Notes (Signed)
Cardiology Office Note   Date:  02/18/2016   ID:  Carol [name], DOB 1932-08-10, MRN [id number]  PCP:  [name], DO  Cardiologist:   Minus Breeding, MD   Chief Complaint  Patient presents with  . AAA      History of Present Illness: Alexi Dorminey is a 82 y.o. female who presents for the patient presents as a new patient evaluation for follow-up of an abdominal aortic aneurysm. She used to live in [place]. Since being here 3-1/2 years ago she hasn't really seen a lot of physicians. However, recently she had some GI problems with nausea. He eventually was hospitalized and I have reviewed these records. She had renal insufficiency with dehydration. This part of her evaluation she did have an echocardiogram which demonstrated moderate aortic stenosis with a well preserved ejection fraction. She had had a CT scan of her abdomen prior to this which demonstrated an infrarenal aortic aneurysm 4.3 cm. I do note that she was also found to have coronary artery calcification. Another issue is a 5.3 x 5.9 cm left lower lobe mass.  The patient has been very week since her hospitalization.  She is just starting to do some walking.  She denies any acute cardiovascular complaints.  She does have some dyspnea with exertion that she thinks has been slowly progressive.  She does not have chest pressure, neck or arm discomfort. She doesn't have any palpitations, presyncope or syncope. She has no PND or orthopnea. She has had claudication symptoms in the past.   Past Medical History  Diagnosis Date  . COPD (chronic obstructive pulmonary disease) ([place])   . Hypertension   . GERD (gastroesophageal reflux disease)   . Cataract   . Fall   . History of esophageal stricture   . Hiatal hernia     Past Surgical History  Procedure Laterality Date  . Eye surgery       Current Outpatient Prescriptions  Medication Sig Dispense Refill  . albuterol (PROVENTIL HFA) 108 (90 Base) MCG/ACT inhaler Inhale 2  puffs into the lungs 2 (two) times daily as needed for shortness of breath. 8 g 5  . amLODipine (NORVASC) 5 MG tablet Take 1 tablet (5 mg total) by mouth daily. 30 tablet 5  . aspirin 81 MG tablet Take 81 mg by mouth daily.    Marland Kitchen atorvastatin (LIPITOR) 10 MG tablet Take 10 mg by mouth daily at 6 PM. Reported on 01/29/2016    . omeprazole (PRILOSEC OTC) 20 MG tablet Take 20 mg by mouth daily as needed (HEARTBURN).    . Sennosides (EX-LAX) 15 MG TABS Take 15 mg by mouth daily as needed (FOR CONSTIPATION).    . SYMBICORT 80-4.5 MCG/ACT inhaler Inhale 2 puffs into the lungs 2 (two) times daily as needed (FOR SOB). 1 Inhaler 11   No current facility-administered medications for this visit.    Allergies:   Review of patient's allergies indicates no known allergies.    Social History:  The patient  reports that she quit smoking about 2 weeks ago. She has never used smokeless tobacco. She reports that she drinks alcohol. She reports that she does not use illicit drugs.   Family History:  The patient's family history includes Alcohol abuse in her father; Breast cancer (age of onset: 41) in her mother; COPD in her father; Early death in her brother; Hypertension in her paternal grandmother.    ROS:  Please see the history of present illness.   Otherwise, review  of systems are positive for none.   All other systems are reviewed and negative.    PHYSICAL EXAM: VS:  BP 150/88 mmHg  Pulse 90  Ht '5\' 4"'$  (1.626 m)  Wt 115 lb 6.4 oz (52.345 kg)  BMI 19.80 kg/m2 , BMI Body mass index is 19.8 kg/(m^2). GENERAL:  Well appearing HEENT:  Pupils equal round and reactive, fundi not visualized, oral mucosa unremarkable NECK:  No jugular venous distention, waveform within normal limits, carotid upstroke brisk and symmetric, no bruits, no thyromegaly LYMPHATICS:  No cervical, inguinal adenopathy LUNGS:  Clear to auscultation bilaterally BACK:  No CVA tenderness CHEST:  Unremarkable HEART:  PMI not displaced or  sustained,S1 and S2 within normal limits, no S3, no S4, no clicks, no rubs, 2/6 soft apical systolic murmur, no diastolic murmurs ABD:  Flat, positive bowel sounds normal in frequency in pitch, no bruits, no rebound, no guarding, no midline pulsatile mass, no hepatomegaly, no splenomegaly EXT:  2 plus pulses upper with decreased DP/PT bilateral lower, no edema, no cyanosis no clubbing, right femora bruit SKIN:  No rashes no nodules NEURO:  Cranial nerves II through XII grossly intact, motor grossly intact throughout PSYCH:  Cognitively intact, oriented to person place and time    EKG:  EKG is not ordered today. The ekg ordered 01/29/16 demonstrates sinus rhythm, rate 93, axis within normal limits, left ventricular hypertrophy by voltage criteria with repolarization changes, left atrial enlargement, right atrial enlargement   Recent Labs: 01/30/2016: Magnesium 2.1 02/10/2016: ALT 12; BUN 7; Creat 0.58*; Hemoglobin 14.9; Platelets 452*; Potassium 3.9; Sodium 137; TSH 3.12    Lipid Panel No results found for: CHOL, TRIG, HDL, CHOLHDL, VLDL, LDLCALC, LDLDIRECT    Wt Readings from Last 3 Encounters:  02/18/16 115 lb 6.4 oz (52.345 kg)  02/10/16 113 lb (51.256 kg)  01/31/16 120 lb 14.4 oz (54.84 kg)      Other studies Reviewed: Additional studies/ records that were reviewed today include: Hospital records, outside CT report. Review of the above records demonstrates:  Please see elsewhere in the note.     ASSESSMENT AND PLAN:  CKD:  Follow per [name], DO  AAA:    I will schedule a follow up abdominal ultrasound.    AS:  Moderate on echo.  I will follow this with repeat echo in Feb next year.  CORONARY CALCIUM:  I would suggest a preop Lexiscan Myoview if she is to have lung surgery.    HTN:  Her BP today is high.  She has readings from home and it is very low.  I asked her to bring her BP cuff into the office to make sure that it is accurate.  I am not sure what to do with this  discrepancy readings.  DYSLIPIDEMIA:  I suggested that she should start Lipitor which she should not do. She couldn't have a lipid profile done in 8 weeks with a goal LDL less than 100  TOBACCO:  She quit smoking!   Current medicines are reviewed at length with the patient today.  The patient does not have concerns regarding medicines.  The following changes have been made:  no change  Labs/ tests ordered today include:   Abdominal ultrasound    Disposition:   FU with me in 3 months.     Signed, Minus Breeding, MD  02/18/2016 5:23 PM    [place]

## 2016-02-18 NOTE — Telephone Encounter (Signed)
Received call from [place] at [facility].  Patient has an appointment to see Dr. Ashok Cordia on 4/5, however, they need for patient to be seen sooner.  She is in dire need of oxygen and they want our office to manage her oxygen.  Patient was in the hospital 01/29/16 but none of our providers saw this patient, so we could not schedule her for a Hospital follow up with a NP.  There are no openings next week with any providers.    Pulmonary mass -Seen on CXR: Around 5.3 x 5.9 mass like opacity in the superior segment left lower lobe -Patient refused CT chest  Recommendations for Outpatient Follow-up:  Patient will be discharged to home. Patient will need to follow up with primary care provider within one week of discharge, repeat CBC and BMP. Patient will need close follow up regarding AAA and pulmonary nodule. Patient should continue medications as prescribed. Patient should follow a heart healthy diet. Advised to stop smoking.  SATURATION QUALIFICATIONS: (This note is used to comply with regulatory documentation for home oxygen) Patient Saturations on Room Air at Rest = 93% Patient Saturations on Room Air while Ambulating = 86% Patient Saturations on Liters of oxygen while Ambulating = % Please briefly explain why patient needs home oxygen: Patient's O2 sats dropped to 86% with gait on room air.  Home oxygen was recommended- patient refused at this time.   Dr. Annamaria Boots, please advise what we should do?

## 2016-02-18 NOTE — Patient Instructions (Signed)
Your physician wants you to follow-up in: 3 Months. You will receive a reminder letter in the mail two months in advance. If you don't receive a letter, please call our office to schedule the follow-up appointment.  Your physician has requested that you have an abdominal aorta duplex. During this test, an ultrasound is used to evaluate the aorta. Allow 30 minutes for this exam. Do not eat after midnight the day before and avoid carbonated beverages

## 2016-02-18 NOTE — Telephone Encounter (Signed)
Suggest her PCP office go ahead and schedule an overnight oximetry on room air. We would need that in addition to the walk test that was already done, to qualify her for home oxygen anyway. Have her keep appointment for now with Dr Ashok Cordia and see her PCP or go to ER sooner if she gets worse before we can see her.

## 2016-02-18 NOTE — Telephone Encounter (Signed)
Called spoke with Diane and made aware of below. Made aware also phone note is available in EPIC. Nothing further needed

## 2016-02-19 ENCOUNTER — Encounter: Payer: Self-pay | Admitting: Family Medicine

## 2016-02-19 DIAGNOSIS — I2510 Atherosclerotic heart disease of native coronary artery without angina pectoris: Secondary | ICD-10-CM | POA: Insufficient documentation

## 2016-02-19 DIAGNOSIS — I700 Atherosclerosis of aorta: Secondary | ICD-10-CM | POA: Insufficient documentation

## 2016-02-19 MED ORDER — ATORVASTATIN CALCIUM 10 MG PO TABS: 10.0000 mg | ORAL_TABLET | Freq: Every day | ORAL | Status: AC

## 2016-02-19 NOTE — Addendum Note (Signed)
Addended by: Vennie Homans on: 02/19/2016 10:08 AM   Modules accepted: Orders

## 2016-02-22 ENCOUNTER — Ambulatory Visit (INDEPENDENT_AMBULATORY_CARE_PROVIDER_SITE_OTHER): Payer: Medicare Other | Admitting: Family Medicine

## 2016-02-22 ENCOUNTER — Encounter: Payer: Self-pay | Admitting: Family Medicine

## 2016-02-22 VITALS — BP 118/73 | HR 98 | Temp 97.9°F | Resp 20 | Wt 112.0 lb

## 2016-02-22 DIAGNOSIS — K449 Diaphragmatic hernia without obstruction or gangrene: Secondary | ICD-10-CM

## 2016-02-22 DIAGNOSIS — Z8719 Personal history of other diseases of the digestive system: Secondary | ICD-10-CM | POA: Diagnosis not present

## 2016-02-22 DIAGNOSIS — R1310 Dysphagia, unspecified: Secondary | ICD-10-CM

## 2016-02-22 DIAGNOSIS — K222 Esophageal obstruction: Secondary | ICD-10-CM

## 2016-02-22 DIAGNOSIS — R1312 Dysphagia, oropharyngeal phase: Secondary | ICD-10-CM | POA: Insufficient documentation

## 2016-02-22 NOTE — Progress Notes (Signed)
Patient ID: Carol [name], female   DOB: Sep 18, 1932, 82 y.o.   MRN: [id number]    Carol [name] , 07/20/1932, 82 y.o., female MRN: [id number]  CC: weight loss/dysphagia Subjective: Pt presents for OV with complaints of weight loss. Pt is accompanied by her daughter and son-in-law. She has a large lung mass that is being evaluated by pulmonology for likely lung cancer. She has a history of small hiatal hernia and esophageal stricture by swallow study/EGD in 2013. At that time she was seen by [facility], Dr. Michail Sermon and family reports at that time she had her esophagus "stretched". Patient states she has an appetite, but feels that she becomes full quickly and food she has lost 3.5 lbs since her last OV a few weeks ago.     No Known Allergies Social History  Substance Use Topics  . Smoking status: Former Smoker -- 1.00 packs/day for 49 years    Quit date: 02/01/2016  . Smokeless tobacco: Never Used  . Alcohol Use: 0.0 oz/week    0 Standard drinks or equivalent per week     Comment: [place]    Past Medical History  Diagnosis Date  . COPD (chronic obstructive pulmonary disease) ([place])   . Hypertension   . GERD (gastroesophageal reflux disease)   . Cataract   . Fall   . History of esophageal stricture   . Hiatal hernia    Past Surgical History  Procedure Laterality Date  . Eye surgery     Family History  Problem Relation Age of Onset  . Breast cancer Mother 29  . Alcohol abuse Father   . COPD Father   . Early death Brother     MVA  . Hypertension Paternal Grandmother      Medication List       This list is accurate as of: 02/22/16 10:38 AM.  Always use your most recent med list.               albuterol 108 (90 Base) MCG/ACT inhaler  Commonly known as:  PROVENTIL HFA  Inhale 2 puffs into the lungs 2 (two) times daily as needed for shortness of breath.     amLODipine 5 MG tablet  Commonly known as:  NORVASC  Take 1 tablet (5 mg total) by mouth daily.     aspirin 81  MG tablet  Take 81 mg by mouth daily.     atorvastatin 10 MG tablet  Commonly known as:  LIPITOR  Take 1 tablet (10 mg total) by mouth daily at 6 PM. Reported on 01/29/2016     EX-LAX 15 MG Tabs  Generic drug:  Sennosides  Take 15 mg by mouth daily as needed (FOR CONSTIPATION).     omeprazole 20 MG tablet  Commonly known as:  PRILOSEC OTC  Take 20 mg by mouth daily as needed (HEARTBURN).     SYMBICORT 80-4.5 MCG/ACT inhaler  Generic drug:  budesonide-formoterol  Inhale 2 puffs into the lungs 2 (two) times daily as needed (FOR SOB).         ROS: Negative, with the exception of above mentioned in HPI   Objective:  BP 118/73 mmHg  Pulse 98  Temp(Src) 97.9 F (36.6 C)  Resp 20  Wt 112 lb (50.803 kg)  SpO2 91% Body mass index is 19.22 kg/(m^2). Gen: Afebrile. No acute distress. Nontoxic in appearance, well developed, well nourished, caucasian female. Pleasant.  HENT: AT. [place]. Mildly tacky mucous membranes. Eyes:Pupils Equal Round Reactive to light, Extraocular movements  intact,  Conjunctiva without redness, discharge or icterus. CV: RRR  Chest: CTAB, no wheeze or crackles. Good air movement, normal resp effort.  Abd: Soft. flat. ND. TTP epigastric region, especially just below sternum. BS present . No Masses palpated. No rebound or guarding.  Neuro: Normal gait. PERLA. EOMi. Alert. Oriented x3   Assessment/Plan: Carol [name] is a 82 y.o. female present for OV for  Dysphagia: small hiatal hernia histroy, h/o esophageal stricture - Ambulatory referral to Gastroenterology ([place]) - weight loss of 3 lbs today, from pt appears to be secondary to mechanical swallowing/dysphagia reasoning. Pt reports a decent appetite. She does likely have cancer of the lung, which is new and evaluation in progress. Discussed weight loss with cancer related illness.  - Discussed small/finely chopped meals, more frequently.  - discussed ensure supplement TID PRN between meals to maintain  nutrition.  - if continued weight loss despite above treatment, consider remeron/megase for appetite. - Pt to monitor weight weekly. - F/u dependent on weight loss and pulmonology/cardiology eval.    electronically signed by:  [name], DO  [place]

## 2016-02-22 NOTE — Patient Instructions (Addendum)
Referral to GI today. You will hear from their office shortly.  If weight loss continues would supplement between meals with ensure or boost.  If decreased appetite becomes an issue would consider a medication to help increase appetite.     Hiatal Hernia A hiatal hernia occurs when part of your stomach slides above the muscle that separates your abdomen from your chest (diaphragm). You can be born with a hiatal hernia (congenital), or it may develop over time. In almost all cases of hiatal hernia, only the top part of the stomach pushes through.  Many people have a hiatal hernia with no symptoms. The larger the hernia, the more likely that you will have symptoms. In some cases, a hiatal hernia allows stomach acid to flow back into the tube that carries food from your mouth to your stomach (esophagus). This may cause heartburn symptoms. Severe heartburn symptoms may mean you have developed a condition called gastroesophageal reflux disease (GERD).  CAUSES  Hiatal hernias are caused by a weakness in the opening (hiatus) where your esophagus passes through your diaphragm to attach to the upper part of your stomach. You may be born with a weakness in your hiatus, or a weakness can develop. RISK FACTORS Older age is a major risk factor for a hiatal hernia. Anything that increases pressure on your diaphragm can also increase your risk of a hiatal hernia. This includes:  Pregnancy.  Excess weight.  Frequent constipation. SIGNS AND SYMPTOMS  People with a hiatal hernia often have no symptoms. If symptoms develop, they are almost always caused by GERD. They may include:  Heartburn.  Belching.  Indigestion.  Trouble swallowing.  Coughing or wheezing.  Sore throat.  Hoarseness.  Chest pain. DIAGNOSIS  A hiatal hernia is sometimes found during an exam for another problem. Your health care provider may suspect a hiatal hernia if you have symptoms of GERD. Tests may be done to diagnose GERD.  These may include:  X-rays of your stomach or chest.  An upper gastrointestinal (GI) series. This is an X-ray exam of your GI tract involving the use of a chalky liquid that you swallow. The liquid shows up clearly on the X-ray.  Endoscopy. This is a procedure to look into your stomach using a thin, flexible tube that has a tiny camera and light on the end of it. TREATMENT  If you have no symptoms, you may not need treatment. If you have symptoms, treatment may include:  Dietary and lifestyle changes to help reduce GERD symptoms.  Medicines. These may include:  Over-the-counter antacids.  Medicines that make your stomach empty more quickly.  Medicines that block the production of stomach acid (H2 blockers).  Stronger medicines to reduce stomach acid (proton pump inhibitors).  You may need surgery to repair the hernia if other treatments are not helping. HOME CARE INSTRUCTIONS   Take all medicines as directed by your health care provider.  Quit smoking, if you smoke.  Try to achieve and maintain a healthy body weight.  Eat frequent small meals instead of three large meals a day. This keeps your stomach from getting too full.  Eat slowly.  Do not lie down right after eating.  Do noteat 1-2 hours before bed.   Do not drink beverages with caffeine. These include cola, coffee, cocoa, and tea.  Do not drink alcohol.  Avoid foods that can make symptoms of GERD worse. These may include:  Fatty foods.  Citrus fruits.  Other foods and drinks that contain  acid.  Avoid putting pressure on your belly. Anything that puts pressure on your belly increases the amount of acid that may be pushed up into your esophagus.   Avoid bending over, especially after eating.  Raise the head of your bed by putting blocks under the legs. This keeps your head and esophagus higher than your stomach.  Do not wear tight clothing around your chest or stomach.  Try not to strain when having  a bowel movement, when urinating, or when lifting heavy objects. SEEK MEDICAL CARE IF:  Your symptoms are not controlled with medicines or lifestyle changes.  You are having trouble swallowing.  You have coughing or wheezing that will not go away. SEEK IMMEDIATE MEDICAL CARE IF:  Your pain is getting worse.  Your pain spreads to your arms, neck, jaw, teeth, or back.  You have shortness of breath.  You sweat for no reason.  You feel sick to your stomach (nauseous) or vomit.  You vomit blood.  You have bright red blood in your stools.  You have black, tarry stools.    This information is not intended to replace advice given to you by your health care provider. Make sure you discuss any questions you have with your health care provider.   Document Released: 02/11/2004 Document Revised: 12/12/2014 Document Reviewed: 11/08/2013 Elsevier Interactive Patient Education 2016 Elsevier Inc.  Esophageal Stricture Esophageal stricture is a condition that causes the esophagus to become narrow. The esophagus is the long tube in your throat that carries food and liquid from your mouth to your stomach. Esophageal stricture can make it difficult, painful, or even impossible to swallow. The condition also makes choking more likely.  CAUSES  Gastroesophageal reflux disease (GERD) is the most common cause of esophageal stricture. In GERD, stomach acid backs up into the esophagus. Over time, this causes scar tissue and leads to narrowing (stricture). Other causes of esophageal stricture include:  Scarring from ingesting a harmful substance.  Damage from medical instruments used in the esophagus.  Radiation therapy.  Cancer. RISK FACTORS You are at greater risk for esophageal stricture if you have GERD or esophageal cancer. SIGNS AND SYMPTOMS  Signs and symptoms of esophageal stricture include:  Difficulty swallowing.  Pain when swallowing.  Heartburn.  Vomiting or spitting up  (regurgitating) food or liquids.  Weight loss.  DIAGNOSIS  Your health care provider may suspect esophageal stricture based on your symptoms. A physical exam will also be done. You may need tests to confirm the diagnosis. These can include:  Upper endoscopy. Your health care provider will insert a flexible tube with a tiny camera on it (endoscope) into your esophagus to check for a stricture. A tissue sample may also be taken to be examined under a microscope (biopsy).  Esophageal pH monitoring. This test involves collecting acid in the esophagus with a tube to determine how much stomach acid is entering the esophagus.  Barium swallow test. For this test, you will drink a barium solution that coats the lining of the esophagus. Then you will have an X-ray taken. The barium solution helps to show if there is stricture. TREATMENT Treatment for esophageal stricture depends on what is causing your condition and how severe it is. Treatment options include:  Esophageal dilatation. In this procedure, a health care provider inserts an endoscope or a tool called a dilator into your esophagus to gently stretch it and make the opening wider.  Stents. In some cases, your health care provider may place a small  device (stent) in the esophagus to keep it open.  Acid-blocking medicines. Taking these helps manage GERD symptoms after an esophageal stricture. This can prevent the stricture from returning. HOME CARE INSTRUCTIONS  Do not drink alcohol.  Do not use any tobacco products, including cigarettes, chewing tobacco, or electronic cigarettes. If you need help quitting, ask your health care provider.  Lose weight if you are overweight.  Wear loose, comfortable clothing.  Do not eat for 3 hours before bedtime.  Elevate your head in bed with pillows.  Do not overeat at meals.  Do not eat foods that make reflux worse. These include:  Fatty foods.  Spicy foods.  Soda.  Tomato  products.  Chocolate. SEEK MEDICAL CARE IF:  You have problems eating or swallowing.  You regurgitate food and liquid. MAKE SURE YOU:  Understand these instructions.  Will watch your condition.  Will get help right away if you are not doing well or get worse.   This information is not intended to replace advice given to you by your health care provider. Make sure you discuss any questions you have with your health care provider.   Document Released: 08/01/2006 Document Revised: 12/12/2014 Document Reviewed: 04/02/2014 Elsevier Interactive Patient Education [employer].

## 2016-02-25 ENCOUNTER — Ambulatory Visit: Payer: No Typology Code available for payment source | Admitting: Cardiovascular Disease

## 2016-02-26 ENCOUNTER — Ambulatory Visit (INDEPENDENT_AMBULATORY_CARE_PROVIDER_SITE_OTHER): Payer: Medicare Other | Admitting: Gastroenterology

## 2016-02-26 ENCOUNTER — Encounter: Payer: Self-pay | Admitting: Gastroenterology

## 2016-02-26 VITALS — BP 100/70 | HR 80 | Ht 63.5 in | Wt 114.0 lb

## 2016-02-26 DIAGNOSIS — R1310 Dysphagia, unspecified: Secondary | ICD-10-CM

## 2016-02-26 HISTORY — PX: OTHER SURGICAL HISTORY: SHX169

## 2016-02-26 MED ORDER — OMEPRAZOLE 40 MG PO CPDR: 40.0000 mg | DELAYED_RELEASE_CAPSULE | Freq: Every day | ORAL | Status: DC

## 2016-02-26 NOTE — Progress Notes (Signed)
HPI: This is a    very pleasant 82 year old woman   who was referred to me by [name] A, DO  to evaluate  dysphasia .    Chief complaint is intermittent solid food dysphagia, weight loss  Barium esophagram ordered by Dr. Michail Sermon at [facility] 2013:  Hiatal hernia with stricture of the distal esophagus. A barium tablet did not pass this stricture. She underwent EGD (per family) with dilation. She never followed up with him. The dilation helped, dysphagia much less frequent.  Intermittent dysphagia, substernal.  Has gotten worse over the past 3-4 months.  Water was helping at first, but usually she has to regurgitate now.    Went to urgent care for RLQ pains. They were concerned about AAA.  Went to [facility] ER.  Was told she had severe kidney dysfunction, was admitted and rehydrated.  Was found to also have a left chest mass (6cm). Due to see pulmonary next week.  She takes prilosec about twice per week, pyrosis around twice per week.  She swallows liquids fine.  Over past 6 months she's lost 12-15 pounds.  Review of systems: Pertinent positive and negative review of systems were noted in the above HPI section. Complete review of systems was performed and was otherwise normal.   Past Medical History  Diagnosis Date  . COPD (chronic obstructive pulmonary disease) ([place])   . Hypertension   . GERD (gastroesophageal reflux disease)   . Cataract   . Fall   . History of esophageal stricture   . Hiatal hernia     Past Surgical History  Procedure Laterality Date  . Eye surgery      Current Outpatient Prescriptions  Medication Sig Dispense Refill  . albuterol (PROVENTIL HFA) 108 (90 Base) MCG/ACT inhaler Inhale 2 puffs into the lungs 2 (two) times daily as needed for shortness of breath. 8 g 5  . amLODipine (NORVASC) 5 MG tablet Take 1 tablet (5 mg total) by mouth daily. 30 tablet 5  . aspirin 81 MG tablet Take 81 mg by mouth daily.    Marland Kitchen atorvastatin (LIPITOR) 10 MG tablet Take 1  tablet (10 mg total) by mouth daily at 6 PM. Reported on 01/29/2016 30 tablet 11  . omeprazole (PRILOSEC OTC) 20 MG tablet Take 20 mg by mouth daily as needed (HEARTBURN).    . Sennosides (EX-LAX) 15 MG TABS Take 15 mg by mouth daily as needed (FOR CONSTIPATION).    . SYMBICORT 80-4.5 MCG/ACT inhaler Inhale 2 puffs into the lungs 2 (two) times daily as needed (FOR SOB). 1 Inhaler 11   No current facility-administered medications for this visit.    Allergies as of 02/26/2016  . (No Known Allergies)    Family History  Problem Relation Age of Onset  . Breast cancer Mother 42  . Alcohol abuse Father   . COPD Father   . Early death Brother     MVA  . Hypertension Paternal Grandmother     Social History   Social History  . Marital Status: Unknown    Spouse Name: N/A  . Number of Children: 1  . Years of Education: N/A   Occupational History  . Not on file.   Social History Main Topics  . Smoking status: Former Smoker -- 1.00 packs/day for 75 years    Quit date: 02/01/2016  . Smokeless tobacco: Never Used  . Alcohol Use: 0.0 oz/week    0 Standard drinks or equivalent per week     Comment:  occassinal   . Drug Use: No  . Sexual Activity: No   Other Topics Concern  . Not on file   Social History Narrative   Divorced. Recently moved to [place] from [place], and lives with her daughter and son-in-law.   HS diploma. Retired.    Former smoker: Quit February 2017, with at least 55-pack-year history.   Occasional glass of wine, no drug use.   Caffeinated beverages, takes a daily vitamin.   Wears her seatbelt. Has dentures.   Smoke detector in the home.   Feels safe in her relationships.     Physical Exam: BP 100/70 mmHg  Pulse 80  Ht 5' 3.5" (1.613 m)  Wt 114 lb (51.71 kg)  BMI 19.87 kg/m2 Constitutional: generally well-appearing Psychiatric: alert and oriented x3 Eyes: extraocular movements intact Mouth: oral pharynx moist, no lesions Neck: supple no  lymphadenopathy Cardiovascular: heart regular rate and rhythm Lungs: clear to auscultation bilaterally Abdomen: soft, nontender, nondistended, no obvious ascites, no peritoneal signs, normal bowel sounds Extremities: no lower extremity edema bilaterally Skin: no lesions on visible extremities   Assessment and plan: 82 y.o. female with  intermittent solid food dysphagia, weight loss, history of presumed benign GE junction stricture 2013 Dr. Michail Sermon, also newly diagnosed lung mass  She has solid food dysphagia, weight loss and a lung mass. This may indicate esophageal cancer with metastasis to the lung however I think more likely she has primary lung cancer as well as a recurrent peptic type stricture that was noted 3 or so years ago by a different gastroenterologist. Endoscopic dilation at that time helped her symptoms quite significantly. I recommended we proceed with repeat EGD and dilation in the next week or 2. In the meantime she is going to increase her antiacid control with prescription strength omeprazole one pill once daily. She is already set to meet pulmonologist about her lung mass. She knows that is her most important health concern.   Owens Loffler, MD [facility] 02/26/2016, 10:12 AM  Cc: Ma Hillock, DO

## 2016-02-26 NOTE — Patient Instructions (Addendum)
We will get records sent from your previous gastroenterologist Dr. Cannon Kettle at [facility] for review.  This will include any endoscopic (colonoscopy or upper endoscopy) procedures and any associated pathology reports.   You will be set up for an upper endoscopy with dilation in next 1-2 weeks. New prescription called in for omeprazole '40mg'$  pill, take once daily.

## 2016-02-29 ENCOUNTER — Encounter: Payer: Self-pay | Admitting: Gastroenterology

## 2016-02-29 ENCOUNTER — Ambulatory Visit (AMBULATORY_SURGERY_CENTER): Payer: Medicare Other | Admitting: Gastroenterology

## 2016-02-29 VITALS — BP 107/72 | HR 80 | Temp 98.2°F | Resp 20 | Ht 63.0 in | Wt 114.0 lb

## 2016-02-29 DIAGNOSIS — R1310 Dysphagia, unspecified: Secondary | ICD-10-CM | POA: Diagnosis not present

## 2016-02-29 MED ORDER — SODIUM CHLORIDE 0.9 % IV SOLN: 500.0000 mL | INTRAVENOUS | Status: DC

## 2016-02-29 NOTE — Patient Instructions (Addendum)
YOU HAD AN ENDOSCOPIC PROCEDURE TODAY AT [facility]:   Refer to the procedure report that was given to you for any specific questions about what was found during the examination.  If the procedure report does not answer your questions, please call your gastroenterologist to clarify.  If you requested that your care partner not be given the details of your procedure findings, then the procedure report has been included in a sealed envelope for you to review at your convenience later.  YOU SHOULD EXPECT: Some feelings of bloating in the abdomen. Passage of more gas than usual.  Walking can help get rid of the air that was put into your GI tract during the procedure and reduce the bloating. If you had a lower endoscopy (such as a colonoscopy or flexible sigmoidoscopy) you may notice spotting of blood in your stool or on the toilet paper. If you underwent a bowel prep for your procedure, you may not have a normal bowel movement for a few days.  Please Note:  You might notice some irritation and congestion in your nose or some drainage.  This is from the oxygen used during your procedure.  There is no need for concern and it should clear up in a day or so.  SYMPTOMS TO REPORT IMMEDIATELY:    Following upper endoscopy (EGD)  Vomiting of blood or coffee ground material  New chest pain or pain under the shoulder blades  Painful or persistently difficult swallowing  New shortness of breath  Fever of 100F or higher  Black, tarry-looking stools  For urgent or emergent issues, a gastroenterologist can be reached at any hour by calling [phone].   DIET: See dilation diet-  Likewise, meals heavy in dairy and vegetables can increase bloating.  Drink plenty of fluids but you should avoid alcoholic beverages for 24 hours.  ACTIVITY:  You should plan to take it easy for the rest of today and you should NOT DRIVE or use heavy machinery until tomorrow (because of the sedation medicines  used during the test).    FOLLOW UP: Our staff will call the number listed on your records the next business day following your procedure to check on you and address any questions or concerns that you may have regarding the information given to you following your procedure. If we do not reach you, we will leave a message.  However, if you are feeling well and you are not experiencing any problems, there is no need to return our call.  We will assume that you have returned to your regular daily activities without incident.  If any biopsies were taken you will be contacted by phone or by letter within the next 1-3 weeks.  Please call us at [phone] if you have not heard about the biopsies in 3 weeks.    SIGNATURES/CONFIDENTIALITY: You and/or your care partner have signed paperwork which will be entered into your electronic medical record.  These signatures attest to the fact that that the information above on your After Visit Summary has been reviewed and is understood.  Full responsibility of the confidentiality of this discharge information lies with you and/or your care-partner.  Dilation diet, hiatal hernia-handouts given  Continue omeprazole daily.

## 2016-02-29 NOTE — Progress Notes (Signed)
Called to room to assist during endoscopic procedure.  Patient ID and intended procedure confirmed with present staff. Received instructions for my participation in the procedure from the performing physician.  

## 2016-02-29 NOTE — Op Note (Signed)
[facility] Patient Name: Carol [name] Procedure Date: 02/29/2016 8:19 AM MRN: [id number] Endoscopist: Milus Banister , MD Age: 82 Referring MD:  Date of Birth: Aug 24, 1932 Gender: Female Procedure:                Upper GI endoscopy Indications:              Dysphagia (previous endoscopic dilation with good                            response several years ago, recently diagnosed with                            large lung mass) Medicines:                Monitored Anesthesia Care Procedure:                Pre-Anesthesia Assessment:                           - Prior to the procedure, a History and Physical                            was performed, and patient medications and                            allergies were reviewed. The patient's tolerance of                            previous anesthesia was also reviewed. The risks                            and benefits of the procedure and the sedation                            options and risks were discussed with the patient.                            All questions were answered, and informed consent                            was obtained. Prior Anticoagulants: The patient has                            taken no previous anticoagulant or antiplatelet                            agents. ASA Grade Assessment: III - A patient with                            severe systemic disease. After reviewing the risks                            and benefits, the patient was deemed in  satisfactory condition to undergo the procedure.                           After obtaining informed consent, the endoscope was                            passed under direct vision. Throughout the                            procedure, the patient's blood pressure, pulse, and                            oxygen saturations were monitored continuously. The                            Model GIF-HQ190 [id number]) scope was introduced                          through the mouth, and advanced to the second part                            of duodenum. The upper GI endoscopy was                            accomplished without difficulty. The patient                            tolerated the procedure well. Scope In: Scope Out: Findings:      One moderate benign-appearing, intrinsic stenosis was found at the       gastroesophageal junction (thick Schatzki's ring vs. focal peptic       stricture). This is not related to your lung mass. This measured 1.1 cm       (inner diameter) and was traversed. A TTS dilator was passed through the       scope. Dilation with an 18-19-20 mm balloon dilator was performed to 18       mm. The dilation site was examined and showed moderate improvement in       luminal narrowing, minor self limited ooozing of blood.      A small hiatal hernia was present.      The exam was otherwise without abnormality. Complications:            No immediate complications. Estimated Blood Loss:     Estimated blood loss: none. Impression:               - Benign-appearing esophageal stenosis. Dilated.                           - Small hiatal hernia.                           - The examination was otherwise normal.                           - No specimens collected. Recommendation:           - Patient has a contact number available  for                            emergencies. The signs and symptoms of potential                            delayed complications were discussed with the                            patient. Return to normal activities tomorrow.                            Written discharge instructions were provided to the                            patient.                           - Resume previous diet.                           - Continue present medications.                           - Repeat upper endoscopy PRN. Procedure Code(s):        --- Professional ---                           [phone],  Esophagogastroduodenoscopy, flexible,                            transoral; with transendoscopic balloon dilation of                            esophagus (less than 30 mm diameter) CPT copyright 2016 American Medical Association. All rights reserved. Milus Banister, MD 02/29/2016 8:42:23 AM This report has been signed electronically. Number of Addenda: 0 Referring MD:      Ma Hillock

## 2016-02-29 NOTE — Progress Notes (Signed)
To PACU  Awake and alert, O2 sat 92-placed on O2 at 3l/min. Pt had recent cold, congested prior to procedure no fever.

## 2016-03-01 ENCOUNTER — Ambulatory Visit (HOSPITAL_COMMUNITY)
Admission: RE | Admit: 2016-03-01 | Discharge: 2016-03-01 | Disposition: A | Discharge status: Home / Self Care | Payer: Medicare Other | Source: Ambulatory Visit | Attending: Cardiology | Admitting: Cardiology

## 2016-03-01 ENCOUNTER — Other Ambulatory Visit: Payer: Self-pay | Admitting: Family Medicine

## 2016-03-01 ENCOUNTER — Telehealth: Payer: Self-pay

## 2016-03-01 DIAGNOSIS — I714 Abdominal aortic aneurysm, without rupture, unspecified: Secondary | ICD-10-CM

## 2016-03-01 DIAGNOSIS — I739 Peripheral vascular disease, unspecified: Secondary | ICD-10-CM

## 2016-03-01 DIAGNOSIS — K219 Gastro-esophageal reflux disease without esophagitis: Secondary | ICD-10-CM | POA: Insufficient documentation

## 2016-03-01 DIAGNOSIS — J449 Chronic obstructive pulmonary disease, unspecified: Secondary | ICD-10-CM | POA: Insufficient documentation

## 2016-03-01 DIAGNOSIS — I708 Atherosclerosis of other arteries: Secondary | ICD-10-CM | POA: Insufficient documentation

## 2016-03-01 DIAGNOSIS — I700 Atherosclerosis of aorta: Secondary | ICD-10-CM | POA: Insufficient documentation

## 2016-03-01 DIAGNOSIS — I10 Essential (primary) hypertension: Secondary | ICD-10-CM | POA: Diagnosis not present

## 2016-03-01 NOTE — Telephone Encounter (Signed)
  Follow up Call-  Call back number 02/29/2016  Post procedure Call Back phone  # [phone]  Permission to leave phone message Yes     Patient questions:  Do you have a fever, pain , or abdominal swelling? No. Pain Score  0 *  Have you tolerated food without any problems? Yes.    Have you been able to return to your normal activities? Yes.    Do you have any questions about your discharge instructions: Diet   No. Medications  No. Follow up visit  No.  Do you have questions or concerns about your Care? No.  Actions: * If pain score is 4 or above: No action needed, pain <4.

## 2016-03-03 ENCOUNTER — Ambulatory Visit (HOSPITAL_COMMUNITY)
Admission: RE | Admit: 2016-03-03 | Discharge: 2016-03-03 | Disposition: A | Discharge status: Home / Self Care | Payer: Medicare Other | Source: Ambulatory Visit | Attending: Family Medicine | Admitting: Family Medicine

## 2016-03-03 DIAGNOSIS — I70201 Unspecified atherosclerosis of native arteries of extremities, right leg: Secondary | ICD-10-CM | POA: Diagnosis not present

## 2016-03-03 DIAGNOSIS — R938 Abnormal findings on diagnostic imaging of other specified body structures: Secondary | ICD-10-CM | POA: Diagnosis not present

## 2016-03-03 DIAGNOSIS — I739 Peripheral vascular disease, unspecified: Secondary | ICD-10-CM | POA: Diagnosis present

## 2016-03-03 DIAGNOSIS — J449 Chronic obstructive pulmonary disease, unspecified: Secondary | ICD-10-CM | POA: Insufficient documentation

## 2016-03-03 DIAGNOSIS — K219 Gastro-esophageal reflux disease without esophagitis: Secondary | ICD-10-CM | POA: Insufficient documentation

## 2016-03-03 DIAGNOSIS — I10 Essential (primary) hypertension: Secondary | ICD-10-CM | POA: Diagnosis not present

## 2016-03-09 ENCOUNTER — Ambulatory Visit (INDEPENDENT_AMBULATORY_CARE_PROVIDER_SITE_OTHER): Payer: Medicare Other | Admitting: Pulmonary Disease

## 2016-03-09 ENCOUNTER — Telehealth: Payer: Self-pay | Admitting: Pulmonary Disease

## 2016-03-09 ENCOUNTER — Encounter: Payer: Self-pay | Admitting: Pulmonary Disease

## 2016-03-09 VITALS — BP 148/68 | HR 103 | Temp 97.7°F | Ht 63.5 in | Wt 116.0 lb

## 2016-03-09 DIAGNOSIS — R918 Other nonspecific abnormal finding of lung field: Secondary | ICD-10-CM

## 2016-03-09 DIAGNOSIS — F17200 Nicotine dependence, unspecified, uncomplicated: Secondary | ICD-10-CM | POA: Diagnosis not present

## 2016-03-09 NOTE — Telephone Encounter (Signed)
IMAGING CT CHEST W/ 02/16/16 (personally reviewed by me): 6-1/2 cm mass in left lower lobe. No pathologic mediastinal or hilar adenopathy appreciated. No pleural effusion or thickening. No other parenchymal nodule or opacity appreciated. No pericardial effusion.  CARDIAC TTE (01/30/16): LV normal in size. EF 65-70%. Normal wall motion. Grade 1 diastolic dysfunction. LA mildly dilated. RA normal in size. RV normal in size and function. Pulmonary artery systolic pressure 20 mmHg. Moderate aortic stenosis without regurgitation. Mild mitral regurgitation. No pulmonic regurgitation. Trivial tricuspid regurgitation. No pericardial effusion.   LABS 02/10/16 CBC: 6.7/14.9/44.0/452 BMP: 137/3.9/99/24/7/0.58/84/8.9 LFT: 3.7/6.8/0.4/102/15/12

## 2016-03-09 NOTE — Patient Instructions (Addendum)
   Continue to abstain from tobacco use  We will schedule you for a PET/CT scan and decide on a biopsy based on this scan  We will also set you up for breathing test to see if you have COPD and how you're lungs are functioning  I will see you back in 2-4 weeks but we will discuss plans before that based on these tests  ORDERS: 1. PET CT scan ASAP 2. Full pulmonary function testing ASAP

## 2016-03-09 NOTE — Progress Notes (Signed)
Subjective:    Patient ID: Carol [name], female    DOB: 28-Oct-1932, 82 y.o.   MRN: [id number]  HPI She reports she was diagnosed with "COPD" based on an imaging study. She was placed on Symbicort 2 years ago. She does feel it has helped her dyspnea in the past. She reports dyspnea with exertion. She does use her rescue inhaler intermittently, mostly when she has traveled to higher altitudes ([place]). She does cough, mostly at night. Cough is productive of a white mucus. Denies any hemoptysis. No chest pain or pressure. No fever, chills, or sweats. She has lost 11 pounds over the last 3-4 weeks. They have attributed this to her esophageal stricture. Approximately 1 week ago she underwent a new dilation & this has helped her gain back 3 pounds in the last week. Previously did have problems regurgitating food. No adenopathy in her neck, groin, or axilla. She reports she did have melena after her discharge but this has resolved. No hematochezia. Remote colonoscopy.   Review of Systems No headaches. No focal weakness, numbness, or tingling. No focal vision loss. No dysuria or hematuria. A pertinent 14 point review of systems is negative except as per the history of presenting illness.  No Known Allergies  Current Outpatient Prescriptions on File Prior to Visit  Medication Sig Dispense Refill  . albuterol (PROVENTIL HFA) 108 (90 Base) MCG/ACT inhaler Inhale 2 puffs into the lungs 2 (two) times daily as needed for shortness of breath. 8 g 5  . amLODipine (NORVASC) 5 MG tablet Take 1 tablet (5 mg total) by mouth daily. 30 tablet 5  . aspirin 81 MG tablet Take 81 mg by mouth daily.    .Marland Kitchenatorvastatin (LIPITOR) 10 MG tablet Take 1 tablet (10 mg total) by mouth daily at 6 PM. Reported on 01/29/2016 30 tablet 11  . omeprazole (PRILOSEC) 40 MG capsule Take 1 capsule (40 mg total) by mouth daily. 90 capsule 3  . Sennosides (EX-LAX) 15 MG TABS Take 15 mg by mouth daily as needed (FOR CONSTIPATION).      . SYMBICORT 80-4.5 MCG/ACT inhaler Inhale 2 puffs into the lungs 2 (two) times daily as needed (FOR SOB). 1 Inhaler 11   No current facility-administered medications on file prior to visit.    Past Medical History  Diagnosis Date  . [place]   . Hypertension   . GERD (gastroesophageal reflux disease)   . Cataract   . Fall   . History of esophageal stricture   . Hiatal hernia   . Osteoporosis     Past Surgical History  Procedure Laterality Date  . Eye surgery    . Colonoscopy    . Esophagogastroduodenoscopy (egd) with esophageal dilation    . Cataract extraction      Family History  Problem Relation Age of Onset  . Breast cancer Mother 41 . Alcohol abuse Father   . Emphysema Father   . Early death Brother     MVA  . Hypertension Paternal Grandmother   . Rheumatologic disease Neg Hx     Social History   Social History  . Marital Status: Unknown    Spouse Name: N/A  . Number of Children: 979 . Years of Education: N/A   Occupational History  . Retired     OGlass blower/designer bClinical cytogeneticist  Social History Main Topics  . Smoking status: Former Smoker -- 1.50 packs/day for 60 years    Types:  Cigarettes    Quit date: 02/01/2016  . Smokeless tobacco: Never Used  . Alcohol Use: 0.0 oz/week    0 Standard drinks or equivalent per week     Comment: 1-2 glasses of wine daily  . Drug Use: No  . Sexual Activity: No   Other Topics Concern  . None   Social History Narrative   Divorced. Recently moved to [place] from [place], and lives with her daughter and son-in-law.   HS diploma. Retired.    Former smoker: Quit February 2017, with at least 55-pack-year history.   Occasional glass of wine, no drug use.   Caffeinated beverages, takes a daily vitamin.   Wears her seatbelt. Has dentures.   Smoke detector in the home.   Feels safe in her relationships.      [facility]:   From [place], [place]. Previously has lived  in [place]. She moved to [facility] in 2013. 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 No pets currently. No bird exposure. No mold exposure.       Objective:   Physical Exam BP 148/68 mmHg  Pulse 103  Temp(Src) 97.7 F (36.5 C) (Oral)  Ht 5' 3.5" (1.613 m)  Wt 116 lb (52.617 kg)  BMI 20.22 kg/m2  SpO2 93% General:  Awake. Alert. No acute distress.  Integument:  Warm & dry. No rash on exposed skin. No bruising. Lymphatics:  No appreciated cervical or supraclavicular lymphadenoapthy. HEENT:  Moist mucus membranes. No oral ulcers. No scleral injection or icterus.  Cardiovascular:  Regular rate. No edema. No appreciable JVD. SEM. Pulmonary:  Good aeration & clear to auscultation bilaterally. Symmetric chest wall expansion. No accessory muscle use. Abdomen: Soft. Normal bowel sounds. Nondistended. Grossly nontender. Musculoskeletal:  Normal bulk and tone. Hand grip strength 5/5 bilaterally. No joint deformity or effusion appreciated. Neurological:  CN 2-12 grossly in tact. No meningismus. Moving all 4 extremities equally. Symmetric brachioradialis deep tendon reflexes. Psychiatric:  Mood and affect congruent. Speech normal rhythm, rate & tone.   IMAGING CXR PA/LAT 12/10/13 (personally reviewed by me): No evidence of nodule or opacity on my review of imaging. No pleural effusion. Heart normal in size. Mediastinum normal in contour. CT CHEST W/ 02/16/16 (personally reviewed by me): 6-1/2 cm mass in left lower lobe. No pathologic mediastinal or hilar adenopathy appreciated. No pleural effusion or thickening. No other parenchymal nodule or opacity appreciated. No pericardial effusion.  CARDIAC TTE (01/30/16): LV normal in size. EF 65-70%. Normal wall motion. Grade 1 diastolic dysfunction. LA mildly dilated. RA normal in size. RV normal in size and function. Pulmonary artery systolic pressure 20 mmHg. Moderate aortic stenosis without regurgitation. Mild mitral  regurgitation. No pulmonic regurgitation. Trivial tricuspid regurgitation. No pericardial effusion.   LABS 02/10/16 CBC: 6.7/14.9/44.0/452 BMP: 137/3.9/99/24/7/0.58/84/8.9 LFT: 3.7/6.8/0.4/102/15/12    Assessment & Plan:  82 year old female with long-standing history of tobacco use and left lower lobe mass. I reviewed patient's chest imaging from 2015 which does not show any nodule or opacity in the same region as her current mass. Given her long-standing history of tobacco use and family history malignancy is of primary concern. We did discuss the need for further biopsy and imaging for diagnosis and staging. I instructed the patient to contact my office for any further questions or concerns before her next appointment.  1. Left lower lobe lung mass: Planning for PET CT scan ASAP. Biopsy depending upon this result. 2. History of tobacco use disorder:  Checking full pulmonary function testing ASAP. 3. Follow-up: Patient to return to clinic in 2-4 weeks or sooner if needed.  Sonia Baller Ashok Cordia, M.D. [facility] Pulmonary & Critical Care Pager:  [phone] After 3pm or if no response, call [phone] 3:08 PM 03/09/2016

## 2016-03-09 NOTE — Addendum Note (Signed)
Addended by: Raymondo Band D on: 03/09/2016 03:15 PM   Modules accepted: Orders

## 2016-03-10 ENCOUNTER — Ambulatory Visit (INDEPENDENT_AMBULATORY_CARE_PROVIDER_SITE_OTHER): Payer: Medicare Other | Admitting: Cardiovascular Disease

## 2016-03-10 ENCOUNTER — Encounter: Payer: Self-pay | Admitting: Cardiovascular Disease

## 2016-03-10 VITALS — BP 118/90 | HR 109 | Ht 64.0 in | Wt 115.8 lb

## 2016-03-10 DIAGNOSIS — R0989 Other specified symptoms and signs involving the circulatory and respiratory systems: Secondary | ICD-10-CM

## 2016-03-10 NOTE — Progress Notes (Signed)
03/10/2016 Carol Carol [name]   [birth year]  [id number]  Primary Physician Carol Pouch, DO Primary Cardiologist: Carol Harp MD Carol Carol [name]   HPI:  Mrs. Carol Carol [name] is an 82 year old female frail-appearing Caucasian female coming by her daughter Carol Carol [name] and son-in-law today. She was referred by Dr. Percival [name] for peripheral vascular evaluation because of a abdominal aortic aneurysm and claudication. She has a history of COPD with 90 pack years tobacco abuse having quit easily. History of hypertension, diabetes or hyperlipidemia. She has a 4.7 cm abdominal aortic areas and found on 2+ ultrasound and CT as well as severe peripheral vascular occlusive disease and her SFAs with diminished ABIs left worse than right. She was having claudication in the past and wishes more limited by shortness of breath from her COPD. She has chronic calcification on chest CT as well as aortic stenosis. She is a 4 cm lung mass and is being worked up now with a PET scan scheduled for next week.   Current Outpatient Prescriptions  Medication Sig Dispense Refill  . albuterol (PROVENTIL HFA) 108 (90 Base) MCG/ACT inhaler Inhale 2 puffs into the lungs 2 (two) times daily as needed for shortness of breath. 8 g 5  . amLODipine (NORVASC) 5 MG tablet Take 1 tablet (5 mg total) by mouth daily. 30 tablet 5  . aspirin 81 MG tablet Take 81 mg by mouth daily.    Marland Kitchen atorvastatin (LIPITOR) 10 MG tablet Take 1 tablet (10 mg total) by mouth daily at 6 PM. Reported on 01/29/2016 30 tablet 11  . omeprazole (PRILOSEC) 40 MG capsule Take 1 capsule (40 mg total) by mouth daily. 90 capsule 3  . Sennosides (EX-LAX) 15 MG TABS Take 15 mg by mouth daily as needed (FOR CONSTIPATION).    . SYMBICORT 80-4.5 MCG/ACT inhaler Inhale 2 puffs into the lungs 2 (two) times daily as needed (FOR SOB). 1 Inhaler 11   No current facility-administered medications for this visit.    No Known Allergies  Social History   Social History  . Marital  Status: Unknown    Spouse Name: N/A  . Number of Children: 36  . Years of Education: N/A   Occupational History  . Retired     Glass blower/designer, Clinical cytogeneticist   Social History Carol [name] Topics  . Smoking status: Former Smoker -- 1.50 packs/day for 60 years    Types: Cigarettes    Quit date: 02/01/2016  . Smokeless tobacco: Never Used  . Alcohol Use: 0.0 oz/week    0 Standard drinks or equivalent per week     Comment: 1-2 glasses of wine daily  . Drug Use: No  . Sexual Activity: No   Other Topics Concern  . Not on file   Social History Narrative   Divorced. Recently moved to [place] from [place], and lives with her daughter and son-in-law.   HS diploma. Retired.    Former smoker: Quit February 2017, with at least 55-pack-year history.   Occasional glass of wine, no drug use.   Caffeinated beverages, takes a daily vitamin.   Wears her seatbelt. Has dentures.   Smoke detector in the home.   Feels safe in her relationships.      [facility]:   From [place], [place]. Previously has lived in [place]. She moved to [facility] in 2013. 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 No pets currently. No bird exposure. No mold exposure.  Review of Systems: General: negative for chills, fever, night sweats or weight changes.  Cardiovascular: negative for chest pain, dyspnea on exertion, edema, orthopnea, palpitations, paroxysmal nocturnal dyspnea or shortness of breath Dermatological: negative for rash Respiratory: negative for cough or wheezing Urologic: negative for hematuria Abdominal: negative for nausea, vomiting, diarrhea, bright red blood per rectum, melena, or hematemesis Neurologic: negative for visual changes, syncope, or dizziness All other systems reviewed and are otherwise negative except as noted above.    Blood pressure 118/90, pulse 109, height '5\' 4"'$  (1.626 m), weight 115 lb 12.8 oz (52.527 kg).  General appearance: alert  and no distress Neck: no adenopathy, no JVD, supple, symmetrical, trachea midline, thyroid not enlarged, symmetric, no tenderness/mass/nodules and rright carotid bruit Lungs: clear to auscultation bilaterally Heart: regular rate and rhythm, S1, S2 normal, no murmur, click, rub or gallop Extremities: extremities normal, atraumatic, no cyanosis or edema  EKG not performed today  ASSESSMENT AND PLAN:   AAA (abdominal aortic aneurysm) [facility]) Mrs. Carol [name] has a 4.7 cm abdominal aortic aneurysm found on duplex ultrasound and abdominal CT. She also has PAD with Dopplers performed 03/03/16 revealing a right ABI 0.69 with moderate SFA disease and a left ABI 0.48 with most likely a high-grade lesion in the left SFA. She was complaining of claudication in the past however her ambulation is mostly limited by dyspnea on exertion at this point related to COPD. Given her other comorbidities including a 4 cm lung mass, aortic stenosis and severe COPD we have decided to temporize and put her PV workup on hold until her lung mass issue is resolved.      Carol Harp MD FACP,FACC,FAHA, [facility] 03/10/2016 2:12 PM

## 2016-03-10 NOTE — Assessment & Plan Note (Signed)
Carol [name] has a 4.7 cm abdominal aortic aneurysm found on duplex ultrasound and abdominal CT. She also has PAD with Dopplers performed 03/03/16 revealing a right ABI 0.69 with moderate SFA disease and a left ABI 0.48 with most likely a high-grade lesion in the left SFA. She was complaining of claudication in the past however her ambulation is mostly limited by dyspnea on exertion at this point related to COPD. Given her other comorbidities including a 4 cm lung mass, aortic stenosis and severe COPD we have decided to temporize and put her PV workup on hold until her lung mass issue is resolved.

## 2016-03-10 NOTE — Patient Instructions (Signed)
Medication Instructions:  Your physician recommends that you continue on your current medications as directed. Please refer to the Current Medication list given to you today.   Labwork: none  Testing/Procedures: Your physician has requested that you have a carotid duplex. This test is an ultrasound of the carotid arteries in your neck. It looks at blood flow through these arteries that supply the brain with blood. Allow one hour for this exam. There are no restrictions or special instructions.  Your physician has requested that you have a upper extremity arterial doppler- During this test, ultrasound is used to evaluate arterial blood flow in the arms. Allow approximately one hour for this exam.    Follow-Up: Your physician wants you to follow-up in: Dr. Gwenlyn Found in 6 months. You will receive a reminder letter in the mail two months in advance. If you don't receive a letter, please call our office to schedule the follow-up appointment.   Any Other Special Instructions Will Be Listed Below (If Applicable).     If you need a refill on your cardiac medications before your next appointment, please call your pharmacy.

## 2016-03-11 ENCOUNTER — Ambulatory Visit: Payer: Medicare Other | Admitting: Cardiovascular Disease

## 2016-03-11 ENCOUNTER — Encounter: Payer: Self-pay | Admitting: Family Medicine

## 2016-03-11 ENCOUNTER — Ambulatory Visit (HOSPITAL_COMMUNITY)
Admission: RE | Admit: 2016-03-11 | Discharge: 2016-03-11 | Disposition: A | Discharge status: Home / Self Care | Payer: Medicare Other | Source: Ambulatory Visit | Attending: Pulmonary Disease | Admitting: Pulmonary Disease

## 2016-03-11 DIAGNOSIS — F17200 Nicotine dependence, unspecified, uncomplicated: Secondary | ICD-10-CM

## 2016-03-11 DIAGNOSIS — R918 Other nonspecific abnormal finding of lung field: Secondary | ICD-10-CM

## 2016-03-11 LAB — PULMONARY FUNCTION TEST
DL/VA % PRED: 41 %
DL/VA: 1.97 ml/min/mmHg/L
DLCO unc % pred: 23 %
DLCO unc: 5.61 ml/min/mmHg
FEF 25-75 POST: 0.42 L/s
FEF 25-75 Pre: 0.34 L/sec
FEF2575-%CHANGE-POST: 26 %
FEF2575-%PRED-PRE: 25 %
FEF2575-%Pred-Post: 31 %
FEV1-%CHANGE-POST: 7 %
FEV1-%PRED-PRE: 46 %
FEV1-%Pred-Post: 50 %
FEV1-PRE: 0.88 L
FEV1-Post: 0.95 L
FEV1FVC-%Change-Post: 0 %
FEV1FVC-%PRED-PRE: 68 %
FEV6-%Change-Post: 7 %
FEV6-%PRED-POST: 75 %
FEV6-%PRED-PRE: 70 %
FEV6-POST: 1.81 L
FEV6-Pre: 1.69 L
FEV6FVC-%Change-Post: -1 %
FEV6FVC-%PRED-PRE: 101 %
FEV6FVC-%Pred-Post: 99 %
FVC-%CHANGE-POST: 8 %
FVC-%PRED-POST: 74 %
FVC-%PRED-PRE: 68 %
FVC-POST: 1.91 L
FVC-PRE: 1.76 L
POST FEV6/FVC RATIO: 95 %
PRE FEV6/FVC RATIO: 96 %
Post FEV1/FVC ratio: 50 %
Pre FEV1/FVC ratio: 50 %
RV % pred: 83 %
RV: 2.02 L
TLC % pred: 98 %
TLC: 4.96 L

## 2016-03-11 MED ORDER — ALBUTEROL SULFATE (2.5 MG/3ML) 0.083% IN NEBU
2.5000 mg | INHALATION_SOLUTION | Freq: Once | RESPIRATORY_TRACT | Status: AC
  Administered 2016-03-11: 2.5 mg via RESPIRATORY_TRACT

## 2016-03-14 ENCOUNTER — Encounter: Payer: Self-pay | Admitting: Family Medicine

## 2016-03-14 DIAGNOSIS — I739 Peripheral vascular disease, unspecified: Secondary | ICD-10-CM | POA: Insufficient documentation

## 2016-03-15 ENCOUNTER — Ambulatory Visit (HOSPITAL_COMMUNITY)
Admission: RE | Admit: 2016-03-15 | Discharge: 2016-03-15 | Disposition: A | Discharge status: Home / Self Care | Payer: Medicare Other | Source: Ambulatory Visit | Attending: Pulmonary Disease | Admitting: Pulmonary Disease

## 2016-03-15 ENCOUNTER — Ambulatory Visit (HOSPITAL_COMMUNITY): Admission: RE | Admit: 2016-03-15 | Payer: Medicare Other | Source: Ambulatory Visit

## 2016-03-15 DIAGNOSIS — R918 Other nonspecific abnormal finding of lung field: Secondary | ICD-10-CM | POA: Diagnosis present

## 2016-03-15 DIAGNOSIS — I714 Abdominal aortic aneurysm, without rupture: Secondary | ICD-10-CM | POA: Insufficient documentation

## 2016-03-15 DIAGNOSIS — R590 Localized enlarged lymph nodes: Secondary | ICD-10-CM | POA: Diagnosis not present

## 2016-03-15 DIAGNOSIS — I700 Atherosclerosis of aorta: Secondary | ICD-10-CM | POA: Insufficient documentation

## 2016-03-15 DIAGNOSIS — I2510 Atherosclerotic heart disease of native coronary artery without angina pectoris: Secondary | ICD-10-CM | POA: Insufficient documentation

## 2016-03-15 LAB — GLUCOSE, CAPILLARY: Glucose-Capillary: 93 mg/dL (ref 65–99)

## 2016-03-15 MED ORDER — FLUDEOXYGLUCOSE F - 18 (FDG) INJECTION
5.6500 | Freq: Once | INTRAVENOUS | Status: AC | PRN
Start: 2016-03-15 — End: 2016-03-15
  Administered 2016-03-15: 5.65 via INTRAVENOUS

## 2016-03-17 ENCOUNTER — Telehealth: Payer: Self-pay | Admitting: Pulmonary Disease

## 2016-03-17 NOTE — Telephone Encounter (Signed)
Spoke with patient's son [name] regarding results of PET CT at patient's request. Explained findings and also my concern for potential malignant spread to her mediastinal lymph nodes. I discussed bronchoscopy including endobronchial ultrasound along with the risks of medication allergy, bleeding, infection, pneumothorax, vocal chord injury, and potentially death. All are in agreement with planning for bronchoscopy. They are going to the beach for a vacation next week and we will plan for her procedure at [facility] the week of 4/24.

## 2016-03-18 ENCOUNTER — Ambulatory Visit (HOSPITAL_COMMUNITY)
Admission: RE | Admit: 2016-03-18 | Discharge: 2016-03-18 | Disposition: A | Discharge status: Home / Self Care | Payer: Medicare Other | Source: Ambulatory Visit | Attending: Cardiovascular Disease | Admitting: Cardiovascular Disease

## 2016-03-18 DIAGNOSIS — R0989 Other specified symptoms and signs involving the circulatory and respiratory systems: Secondary | ICD-10-CM

## 2016-03-18 DIAGNOSIS — G458 Other transient cerebral ischemic attacks and related syndromes: Secondary | ICD-10-CM | POA: Diagnosis not present

## 2016-03-18 DIAGNOSIS — I6523 Occlusion and stenosis of bilateral carotid arteries: Secondary | ICD-10-CM | POA: Diagnosis not present

## 2016-03-18 DIAGNOSIS — R918 Other nonspecific abnormal finding of lung field: Secondary | ICD-10-CM | POA: Diagnosis not present

## 2016-03-22 ENCOUNTER — Telehealth: Payer: Self-pay | Admitting: Pulmonary Disease

## 2016-03-22 DIAGNOSIS — R918 Other nonspecific abnormal finding of lung field: Secondary | ICD-10-CM

## 2016-03-22 NOTE — Progress Notes (Signed)
Quick Note:  Order placed for bronch. Please see 03/22/16 phone message for details. ______

## 2016-03-22 NOTE — Telephone Encounter (Signed)
Ok dr Ashok Cordia i can only schedule EBUS'@[facility]'$  ON Monday'@7'$ :30AM NO OTHER TIME NOW I CAN USE [facility] AT ANYTIME IF THEY HAVE OPENINGS WHAT DO YOU WANT ME TO DO?

## 2016-03-22 NOTE — Telephone Encounter (Signed)
Spoke with pt's son-in-law, Carol [name].  Requesting bronch appt.  Per PET scan:  Notes Recorded by [name], MD on 03/17/2016 at 6:43 PM I called patient and spoke with her and her son Carol [name]. We need to schedule a bronchoscopy with endobronchial ultrasound the week of 4/24 at [facility]. I can do the procedure on Monday after 10am. Or I can do it on Wednesday, Thursday, or Friday at 7am. The procedure can be done with MAC on Monday or it can be done with me and respiratory therapy, without anesthesia, on the other days. Ideally I do not want the patient intubated for the procedure. She needs no blood work prior to the procedure. When scheduling this contact her son Carol [name] as they are going to the beach next week. Thank you.  -----  PCCs, order placed.  Please advise.  Thank you.

## 2016-03-23 NOTE — Telephone Encounter (Signed)
EBUS'@[facility]  10:00AM IS THIS OK I WILL CALL THE PT THANKS LIBBY

## 2016-03-23 NOTE — Telephone Encounter (Signed)
We did it once before with me doing conscious sedation at [facility] in the [place] [facility]. If they refuse to let us do this again then I will only agree to [facility] if they understand that I don't want the patient intubated. If this is a problem for anesthesia, as it has been with previous cases, then we have a problem because the procedure doesn't allow appropriate staging with intubated patients and also makes the cases twice as long. Let me know, I am on vacation but will check my messages tomorrow night. Thanks.

## 2016-03-24 ENCOUNTER — Encounter (HOSPITAL_COMMUNITY): Payer: Self-pay | Admitting: *Deleted

## 2016-03-24 NOTE — Telephone Encounter (Signed)
Works for me. Thanks.

## 2016-03-24 NOTE — Telephone Encounter (Signed)
Spoke to son [name] he is aware of the appt time and date for the [place]

## 2016-03-25 ENCOUNTER — Other Ambulatory Visit (HOSPITAL_COMMUNITY): Payer: Self-pay | Admitting: *Deleted

## 2016-03-25 NOTE — Progress Notes (Signed)
Spoke with dr rose anesthesia and made aware of 01-29-16 ekg results and patient medical histort, orders received from dr rose to repeat ekg day of procedure 03-28-16.

## 2016-03-26 NOTE — Anesthesia Preprocedure Evaluation (Addendum)
Anesthesia Evaluation  Patient identified by MRN, date of birth, ID band Patient awake    Reviewed: Allergy & Precautions, H&P , NPO status , Patient's Chart, lab work & pertinent test results  History of Anesthesia Complications Negative for: history of anesthetic complications  Airway Mallampati: II  TM Distance: >3 FB Neck ROM: full    Dental no notable dental hx.    Pulmonary COPD, former smoker,  Patient with mild obstructive pulmonary disease by PFTs, >90 year smoking history   Pulmonary exam normal breath sounds clear to auscultation       Cardiovascular hypertension, + CAD and + Peripheral Vascular Disease  Normal cardiovascular exam+ Valvular Problems/Murmurs AS  Rhythm:regular Rate:Normal  01/2016 Echo with normal EF, moderate AS subjectively (no valve gradient or velocities obtained)   Neuro/Psych  Neuromuscular disease    GI/Hepatic negative GI ROS, Neg liver ROS, hiatal hernia, GERD  ,  Endo/Other  negative endocrine ROS  Renal/GU      Musculoskeletal   Abdominal   Peds  Hematology negative hematology ROS (+)   Anesthesia Other Findings   Reproductive/Obstetrics negative OB ROS                          Anesthesia Physical Anesthesia Plan  ASA: III  Anesthesia Plan: MAC   Post-op Pain Management:    Induction: Intravenous  Airway Management Planned: Nasal Cannula  Additional Equipment:   Intra-op Plan:   Post-operative Plan:   Informed Consent: I have reviewed the patients History and Physical, chart, labs and discussed the procedure including the risks, benefits and alternatives for the proposed anesthesia with the patient or authorized representative who has indicated his/her understanding and acceptance.   Dental Advisory Given  Plan Discussed with: Anesthesiologist, CRNA and Surgeon  Anesthesia Plan Comments:         Anesthesia Quick Evaluation

## 2016-03-28 ENCOUNTER — Ambulatory Visit (HOSPITAL_COMMUNITY): Payer: Medicare Other

## 2016-03-28 ENCOUNTER — Ambulatory Visit (HOSPITAL_COMMUNITY): Payer: Medicare Other | Admitting: Anesthesiology

## 2016-03-28 ENCOUNTER — Encounter (HOSPITAL_COMMUNITY)
Admission: RE | Disposition: A | Discharge status: Home / Self Care | Payer: Self-pay | Source: Ambulatory Visit | Attending: Pulmonary Disease

## 2016-03-28 ENCOUNTER — Ambulatory Visit (HOSPITAL_COMMUNITY)
Admission: RE | Admit: 2016-03-28 | Discharge: 2016-03-28 | Disposition: A | Discharge status: Home / Self Care | Payer: Medicare Other | Source: Ambulatory Visit | Attending: Pulmonary Disease | Admitting: Pulmonary Disease

## 2016-03-28 ENCOUNTER — Encounter (HOSPITAL_COMMUNITY): Payer: Self-pay

## 2016-03-28 DIAGNOSIS — K449 Diaphragmatic hernia without obstruction or gangrene: Secondary | ICD-10-CM | POA: Diagnosis not present

## 2016-03-28 DIAGNOSIS — J449 Chronic obstructive pulmonary disease, unspecified: Secondary | ICD-10-CM | POA: Insufficient documentation

## 2016-03-28 DIAGNOSIS — J984 Other disorders of lung: Secondary | ICD-10-CM

## 2016-03-28 DIAGNOSIS — Z7982 Long term (current) use of aspirin: Secondary | ICD-10-CM | POA: Diagnosis not present

## 2016-03-28 DIAGNOSIS — I10 Essential (primary) hypertension: Secondary | ICD-10-CM | POA: Diagnosis not present

## 2016-03-28 DIAGNOSIS — Z87891 Personal history of nicotine dependence: Secondary | ICD-10-CM | POA: Diagnosis not present

## 2016-03-28 DIAGNOSIS — Z79899 Other long term (current) drug therapy: Secondary | ICD-10-CM | POA: Diagnosis not present

## 2016-03-28 DIAGNOSIS — R918 Other nonspecific abnormal finding of lung field: Secondary | ICD-10-CM | POA: Insufficient documentation

## 2016-03-28 DIAGNOSIS — I2510 Atherosclerotic heart disease of native coronary artery without angina pectoris: Secondary | ICD-10-CM | POA: Insufficient documentation

## 2016-03-28 DIAGNOSIS — K219 Gastro-esophageal reflux disease without esophagitis: Secondary | ICD-10-CM | POA: Insufficient documentation

## 2016-03-28 DIAGNOSIS — I739 Peripheral vascular disease, unspecified: Secondary | ICD-10-CM | POA: Diagnosis not present

## 2016-03-28 DIAGNOSIS — Z7951 Long term (current) use of inhaled steroids: Secondary | ICD-10-CM | POA: Insufficient documentation

## 2016-03-28 HISTORY — PX: ENDOBRONCHIAL ULTRASOUND: SHX5096

## 2016-03-28 HISTORY — DX: Atherosclerotic heart disease of native coronary artery without angina pectoris: I25.10

## 2016-03-28 SURGERY — ENDOBRONCHIAL ULTRASOUND (EBUS)
Anesthesia: Monitor Anesthesia Care | Laterality: Bilateral

## 2016-03-28 MED ORDER — ALBUTEROL SULFATE (2.5 MG/3ML) 0.083% IN NEBU
2.5000 mg | INHALATION_SOLUTION | Freq: Once | RESPIRATORY_TRACT | Status: AC
  Administered 2016-03-28: 2.5 mg via RESPIRATORY_TRACT

## 2016-03-28 MED ORDER — FENTANYL CITRATE (PF) 250 MCG/5ML IJ SOLN
INTRAMUSCULAR | Status: DC | PRN
  Administered 2016-03-28 (×4): 25 ug via INTRAVENOUS

## 2016-03-28 MED ORDER — METOPROLOL TARTRATE 1 MG/ML IV SOLN
INTRAVENOUS | Status: AC
  Filled 2016-03-28: qty 5

## 2016-03-28 MED ORDER — MIDAZOLAM HCL 5 MG/5ML IJ SOLN
INTRAMUSCULAR | Status: DC | PRN
  Administered 2016-03-28: 0.5 mg via INTRAVENOUS
  Administered 2016-03-28: 1 mg via INTRAVENOUS

## 2016-03-28 MED ORDER — LACTATED RINGERS IV SOLN
INTRAVENOUS | Status: DC | PRN
  Administered 2016-03-28: 10:00:00 via INTRAVENOUS

## 2016-03-28 MED ORDER — METOPROLOL TARTRATE 1 MG/ML IV SOLN
INTRAVENOUS | Status: DC | PRN
  Administered 2016-03-28: 2.5 mg via INTRAVENOUS

## 2016-03-28 MED ORDER — LIDOCAINE HCL (CARDIAC) 20 MG/ML IV SOLN
INTRAVENOUS | Status: AC
  Filled 2016-03-28: qty 5

## 2016-03-28 MED ORDER — ALBUTEROL SULFATE (2.5 MG/3ML) 0.083% IN NEBU
INHALATION_SOLUTION | RESPIRATORY_TRACT | Status: AC
  Filled 2016-03-28: qty 3

## 2016-03-28 MED ORDER — PROPOFOL 10 MG/ML IV BOLUS
INTRAVENOUS | Status: AC
  Filled 2016-03-28: qty 20

## 2016-03-28 MED ORDER — PROPOFOL 10 MG/ML IV BOLUS
INTRAVENOUS | Status: DC | PRN
  Administered 2016-03-28: 10 mg via INTRAVENOUS

## 2016-03-28 MED ORDER — ROCURONIUM BROMIDE 100 MG/10ML IV SOLN
INTRAVENOUS | Status: AC
  Filled 2016-03-28: qty 1

## 2016-03-28 MED ORDER — PROPOFOL 500 MG/50ML IV EMUL
INTRAVENOUS | Status: DC | PRN
  Administered 2016-03-28: 75 ug/kg/min via INTRAVENOUS

## 2016-03-28 MED ORDER — SODIUM CHLORIDE 0.9 % IV SOLN: Freq: Once | INTRAVENOUS | Status: DC

## 2016-03-28 MED ORDER — HYDRALAZINE HCL 20 MG/ML IJ SOLN
INTRAMUSCULAR | Status: DC | PRN
  Administered 2016-03-28: 5 mg via INTRAVENOUS

## 2016-03-28 MED ORDER — FENTANYL CITRATE (PF) 100 MCG/2ML IJ SOLN
INTRAMUSCULAR | Status: AC
  Filled 2016-03-28: qty 2

## 2016-03-28 MED ORDER — MIDAZOLAM HCL 2 MG/2ML IJ SOLN
INTRAMUSCULAR | Status: AC
  Filled 2016-03-28: qty 2

## 2016-03-28 NOTE — Anesthesia Postprocedure Evaluation (Addendum)
Anesthesia Post Note  Patient: Carol [name]  Procedure(s) Performed: Procedure(s) (LRB): ENDOBRONCHIAL ULTRASOUND (Bilateral)  Patient location during evaluation: PACU Anesthesia Type: MAC Level of consciousness: awake and alert, oriented and patient cooperative Pain management: pain level controlled Vital Signs Assessment: post-procedure vital signs reviewed and stable Respiratory status: spontaneous breathing, nonlabored ventilation, respiratory function stable and patient connected to nasal cannula oxygen Cardiovascular status: stable and blood pressure returned to baseline Anesthetic complications: no    Last Vitals:  Filed Vitals:   03/28/16 1200 03/28/16 1210  BP: 177/65 188/59  Pulse: 63 81  Temp:    Resp: 23 20    Last Pain: There were no vitals filed for this visit.               Zenaida Deed

## 2016-03-28 NOTE — Transfer of Care (Signed)
Immediate Anesthesia Transfer of Care Note  Patient: Carol [name]  Procedure(s) Performed: Procedure(s) with comments: ENDOBRONCHIAL ULTRASOUND (Bilateral) - dr. Ashok Cordia insists on mac not general anesthesia  Patient Location: PACU  Anesthesia Type:MAC  Level of Consciousness: awake, alert , oriented and patient cooperative  Airway & Oxygen Therapy: Patient Spontanous Breathing and Patient connected to face mask oxygen  Post-op Assessment: Report given to RN, Post -op Vital signs reviewed and stable and Patient moving all extremities X 4  Post vital signs: stable  Last Vitals:  Filed Vitals:   03/28/16 0916 03/28/16 1145  BP: 199/76 140/77  Pulse: 92 76  Temp: 36.4 C 36.5 C  Resp: 21 21    Complications: No apparent anesthesia complications

## 2016-03-28 NOTE — H&P (View-Only) (Signed)
Subjective:    Patient ID: Carol [name], female    DOB: 09-15-1932, 82 y.o.   MRN: [id number]  HPI She reports she was diagnosed with "COPD" based on an imaging study. She was placed on Symbicort 2 years ago. She does feel it has helped her dyspnea in the past. She reports dyspnea with exertion. She does use her rescue inhaler intermittently, mostly when she has traveled to higher altitudes ([place]). She does cough, mostly at night. Cough is productive of a white mucus. Denies any hemoptysis. No chest pain or pressure. No fever, chills, or sweats. She has lost 11 pounds over the last 3-4 weeks. They have attributed this to her esophageal stricture. Approximately 1 week ago she underwent a new dilation & this has helped her gain back 3 pounds in the last week. Previously did have problems regurgitating food. No adenopathy in her neck, groin, or axilla. She reports she did have melena after her discharge but this has resolved. No hematochezia. Remote colonoscopy.   Review of Systems No headaches. No focal weakness, numbness, or tingling. No focal vision loss. No dysuria or hematuria. A pertinent 14 point review of systems is negative except as per the history of presenting illness.  No Known Allergies  Current Outpatient Prescriptions on File Prior to Visit  Medication Sig Dispense Refill  . albuterol (PROVENTIL HFA) 108 (90 Base) MCG/ACT inhaler Inhale 2 puffs into the lungs 2 (two) times daily as needed for shortness of breath. 8 g 5  . amLODipine (NORVASC) 5 MG tablet Take 1 tablet (5 mg total) by mouth daily. 30 tablet 5  . aspirin 81 MG tablet Take 81 mg by mouth daily.    .Marland Kitchenatorvastatin (LIPITOR) 10 MG tablet Take 1 tablet (10 mg total) by mouth daily at 6 PM. Reported on 01/29/2016 30 tablet 11  . omeprazole (PRILOSEC) 40 MG capsule Take 1 capsule (40 mg total) by mouth daily. 90 capsule 3  . Sennosides (EX-LAX) 15 MG TABS Take 15 mg by mouth daily as needed (FOR CONSTIPATION).      . SYMBICORT 80-4.5 MCG/ACT inhaler Inhale 2 puffs into the lungs 2 (two) times daily as needed (FOR SOB). 1 Inhaler 11   No current facility-administered medications on file prior to visit.    Past Medical History  Diagnosis Date  . [place]   . Hypertension   . GERD (gastroesophageal reflux disease)   . Cataract   . Fall   . History of esophageal stricture   . Hiatal hernia   . Osteoporosis     Past Surgical History  Procedure Laterality Date  . Eye surgery    . Colonoscopy    . Esophagogastroduodenoscopy (egd) with esophageal dilation    . Cataract extraction      Family History  Problem Relation Age of Onset  . Breast cancer Mother 475 . Alcohol abuse Father   . Emphysema Father   . Early death Brother     MVA  . Hypertension Paternal Grandmother   . Rheumatologic disease Neg Hx     Social History   Social History  . Marital Status: Unknown    Spouse Name: N/A  . Number of Children: 952 . Years of Education: N/A   Occupational History  . Retired     OGlass blower/designer bClinical cytogeneticist  Social History Main Topics  . Smoking status: Former Smoker -- 1.50 packs/day for 60 years    Types:  Cigarettes    Quit date: 02/01/2016  . Smokeless tobacco: Never Used  . Alcohol Use: 0.0 oz/week    0 Standard drinks or equivalent per week     Comment: 1-2 glasses of wine daily  . Drug Use: No  . Sexual Activity: No   Other Topics Concern  . None   Social History Narrative   Divorced. Recently moved to [place] from [place], and lives with her daughter and son-in-law.   HS diploma. Retired.    Former smoker: Quit February 2017, with at least 55-pack-year history.   Occasional glass of wine, no drug use.   Caffeinated beverages, takes a daily vitamin.   Wears her seatbelt. Has dentures.   Smoke detector in the home.   Feels safe in her relationships.      [facility]:   From [place], [place]. Previously has lived  in [place]. She moved to [facility] in 2013. 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 No pets currently. No bird exposure. No mold exposure.       Objective:   Physical Exam BP 148/68 mmHg  Pulse 103  Temp(Src) 97.7 F (36.5 C) (Oral)  Ht 5' 3.5" (1.613 m)  Wt 116 lb (52.617 kg)  BMI 20.22 kg/m2  SpO2 93% General:  Awake. Alert. No acute distress.  Integument:  Warm & dry. No rash on exposed skin. No bruising. Lymphatics:  No appreciated cervical or supraclavicular lymphadenoapthy. HEENT:  Moist mucus membranes. No oral ulcers. No scleral injection or icterus.  Cardiovascular:  Regular rate. No edema. No appreciable JVD. SEM. Pulmonary:  Good aeration & clear to auscultation bilaterally. Symmetric chest wall expansion. No accessory muscle use. Abdomen: Soft. Normal bowel sounds. Nondistended. Grossly nontender. Musculoskeletal:  Normal bulk and tone. Hand grip strength 5/5 bilaterally. No joint deformity or effusion appreciated. Neurological:  CN 2-12 grossly in tact. No meningismus. Moving all 4 extremities equally. Symmetric brachioradialis deep tendon reflexes. Psychiatric:  Mood and affect congruent. Speech normal rhythm, rate & tone.   IMAGING CXR PA/LAT 12/10/13 (personally reviewed by me): No evidence of nodule or opacity on my review of imaging. No pleural effusion. Heart normal in size. Mediastinum normal in contour. CT CHEST W/ 02/16/16 (personally reviewed by me): 6-1/2 cm mass in left lower lobe. No pathologic mediastinal or hilar adenopathy appreciated. No pleural effusion or thickening. No other parenchymal nodule or opacity appreciated. No pericardial effusion.  CARDIAC TTE (01/30/16): LV normal in size. EF 65-70%. Normal wall motion. Grade 1 diastolic dysfunction. LA mildly dilated. RA normal in size. RV normal in size and function. Pulmonary artery systolic pressure 20 mmHg. Moderate aortic stenosis without regurgitation. Mild mitral  regurgitation. No pulmonic regurgitation. Trivial tricuspid regurgitation. No pericardial effusion.   LABS 02/10/16 CBC: 6.7/14.9/44.0/452 BMP: 137/3.9/99/24/7/0.58/84/8.9 LFT: 3.7/6.8/0.4/102/15/12    Assessment & Plan:  82 year old female with long-standing history of tobacco use and left lower lobe mass. I reviewed patient's chest imaging from 2015 which does not show any nodule or opacity in the same region as her current mass. Given her long-standing history of tobacco use and family history malignancy is of primary concern. We did discuss the need for further biopsy and imaging for diagnosis and staging. I instructed the patient to contact my office for any further questions or concerns before her next appointment.  1. Left lower lobe lung mass: Planning for PET CT scan ASAP. Biopsy depending upon this result. 2. History of tobacco use disorder:  Checking full pulmonary function testing ASAP. 3. Follow-up: Patient to return to clinic in 2-4 weeks or sooner if needed.  Sonia Baller Ashok Cordia, M.D. [facility] Pager:  [phone] After 3pm or if no response, call [phone] 3:08 PM 03/09/2016

## 2016-03-28 NOTE — Interval H&P Note (Signed)
History and Physical Interval Note:  03/28/2016 10:15 AM  Carol [name]  has presented today for bronchscopy, with the diagnosis of Lung mass  The various methods of treatment have been discussed with the patient and family. After consideration of risks, benefits and other options for treatment, the patient has consented to  Procedure(s) with comments: ENDOBRONCHIAL ULTRASOUND (Bilateral) - dr. Ashok Cordia insists on mac not general anesthesia as a surgical intervention .  The patient's history has been reviewed, patient examined, no change in status, stable for surgery.  I have reviewed the patient's chart and labs.  Questions were answered to the patient's satisfaction.     Tera Partridge

## 2016-03-28 NOTE — Progress Notes (Signed)
Oxygen saturation range 88-87% on room air Dr Cristy Friedlander notified,ok for discharged.Patient instructed to follow instructions given at discharge

## 2016-03-28 NOTE — Op Note (Signed)
Video Bronchoscopy Procedure Note  Pre-Procedure Diagnoses: 1.  Left Lower Lobe Lung Mass  Procedures Performed: 1. Bronchoscopy with Airway Inspection 2. Endobronchial Ultrasound with Needle Aspiration  Consent:  Informed consent was obtained from the patient after discussing the risks and benefits of the procedure including bleeding, infection, pneumothorax, medication allergy, vocal chord injury, and potentially death.  Medications Administered During Procedure: 1. Lidocaine 1% gargle 10cc 2. Lidocaine 1% 12cc via bronchoscope 3. Remainder of sedation per anesthesia records  Pre-Procedure Physical Exam: General:  No acute distress. Awake. Alert. ASA Class I. HEENT:  Moist mucus membranes. No oral ulcers. Mallampati Class III. Cardiovascular:  Regular rate. No edema. No appreciable JVD. Pulmonary:  Clear to auscultation with good aeration bilaterally. Normal work of breathing on room air. Abdomen:  Soft. Nontender. Nondistended. Normal bowel sounds. Musculoskeletal:  Normal bulk and tone. Normal neck flexion & extension. Neurological:  Oriented to person, place, and time. Moving all 4 extremities equally.  Description of Procedure: Patient was brought back to the endoscopy procedure room.  A time out was performed to identify the correct patient and procedure.  Lidocaine gargle was performed.  Patient was laid recumbent and conscious sedation was administered by CRNA.  Bite block was inserted and towel was placed over the patient's eyes.  Flexible bronchoscope was then inserted into the posterior pharynx until vocal chords were in full view. There was no abnormality of the vocal chords or arytenoids.  A total of 6cc of Lidocaine was used to anesthetize the vocal chords.  The bronchoscope was then inserted between the vocal chords with ease into the proximal trachea.  Lidocaine was then used to anesthetize the patient's proximal airways.  An airway inspeciton was performed finding copious  moderately thick secretions with some mucus plugging.  No endobronchial mass or mucoscal abnormality.  The flexible bronchoscope was then removed from the patient's airways after suctioning of the remaining secretions.  The endobronchial ultrasound scope was then inserted into the posterior pharynx and between the vocal chords with ease.  Enlarged lymph nodes were noted at levels 7 and 11L with endobronchial ultrasound.  Under direct visualization with the ultrasound and a 22 gauge needle a total of 4 passes were performed at level 7 before moving to 11L. At 11L a total of 3 passes were performed under direct ultrasound visualization.  Hemostasis was directly visualized.  The remaining secretions were suctioned from the patient's airways and the endobronchial ultrasound scope was removed.  The bite block was removed and the patient was returned to the upright position.  Blood Loss:  Less than 10 cc.  Complications:  Transient desaturation that responded to jaw thrust.  EBUS Procedure: Level 2R:  No lymph node visualized. Level 2L:  No lymph node visualized. Level 4R:  No lymph node visualized.  Level 4L:  Single lymph node / 49m in diameter / Not amenable to biopsy Level 7:  3 lymph nodes / largest node 1cm / ROSE / passes 1-3 for cell block & cytology / pass 4 for cell block Level 10R:  No lymph node visualized. Level 11R:  No lymph node visualized. Level 11L:  Single lymph node / 1cm / passes 5 - 7 for cell block  Post Procedure Stat Portable CXR:  Ordered and pending.  Post Procedure Instructions: Personally spoke with the patient's family relaying the preliminary results of the procedure.  Instructed them that the patient is not to operate a car for 24 hours.  The patient should seek immediate medical  attention if there is any persistent or progressive hemoptysis, difficulty breathing, or chest pain/discomfort.  The patient should also notify me immediately or seek medical attention for any  purulent sputum production or fever occuring today or in the coming days.  The patient will be contacted by me once the final results of the studies are available.  The patient should call my office if they have any questions.

## 2016-03-28 NOTE — Discharge Instructions (Signed)
Seek immediate medical attention for any chest discomfort, difficulty breathing, or persistent bloody sputum. Call my office if you develop any persistent fever or cough productive of a yellow, green, or purulent sputum.    Flexible Bronchoscopy, Care After These instructions give you information on caring for yourself after your procedure. Your doctor may also give you more specific instructions. Call your doctor if you have any problems or questions after your procedure. HOME CARE  Do not eat or drink anything for 2 hours after your procedure. If you try to eat or drink before the medicine wears off, food or drink could go into your lungs. You could also burn yourself.  After 2 hours have passed and when you can cough and gag normally, you may eat soft food and drink liquids slowly.  The day after the test, you may eat your normal diet.  You may do your normal activities.  Keep all doctor visits. GET HELP RIGHT AWAY IF:  You get more and more short of breath.  You get light-headed.  You feel like you are going to pass out (faint).  You have chest pain.  You have new problems that worry you.  You cough up more than a little blood.  You cough up more blood than before. MAKE SURE YOU:  Understand these instructions.  Will watch your condition.  Will get help right away if you are not doing well or get worse.   This information is not intended to replace advice given to you by your health care provider. Make sure you discuss any questions you have with your health care provider.   Document Released: 09/18/2009 Document Revised: 11/26/2013 Document Reviewed: 07/26/2013 Elsevier Interactive Patient Education [employer].

## 2016-03-29 ENCOUNTER — Encounter (HOSPITAL_COMMUNITY): Payer: Self-pay | Admitting: Pulmonary Disease

## 2016-03-30 ENCOUNTER — Telehealth: Payer: Self-pay | Admitting: Cardiology

## 2016-03-30 NOTE — Telephone Encounter (Signed)
No answer. Left message to call back.   

## 2016-03-30 NOTE — Telephone Encounter (Signed)
Left message for SOL that results were given to pt eariler today.  He is on DPR, and if he would like for [place] to review carotid doppler results with him also, he is welcome to call back.

## 2016-03-30 NOTE — Telephone Encounter (Signed)
Follow up   Pt son n law [name] is calling to speak to Carol [name] he verbalized that she left a vm   He said he has missed call again from RN again today  He wants the results of his doppler that was done last week 03/18/2016

## 2016-03-30 NOTE — Telephone Encounter (Signed)
Routing to [employer].

## 2016-03-30 NOTE — Telephone Encounter (Signed)
New message   Pt husband calling to return rn call

## 2016-03-31 ENCOUNTER — Encounter: Payer: Self-pay | Admitting: Pulmonary Disease

## 2016-03-31 ENCOUNTER — Ambulatory Visit (INDEPENDENT_AMBULATORY_CARE_PROVIDER_SITE_OTHER): Payer: Medicare Other | Admitting: Pulmonary Disease

## 2016-03-31 VITALS — BP 150/72 | HR 101 | Ht 64.0 in | Wt 120.8 lb

## 2016-03-31 DIAGNOSIS — R0600 Dyspnea, unspecified: Secondary | ICD-10-CM | POA: Diagnosis not present

## 2016-03-31 DIAGNOSIS — J984 Other disorders of lung: Secondary | ICD-10-CM

## 2016-03-31 DIAGNOSIS — J449 Chronic obstructive pulmonary disease, unspecified: Secondary | ICD-10-CM

## 2016-03-31 DIAGNOSIS — R918 Other nonspecific abnormal finding of lung field: Secondary | ICD-10-CM

## 2016-03-31 NOTE — Progress Notes (Signed)
Subjective:    Patient ID: Carol [name], female    DOB: 11-30-1932, 82 y.o.   MRN: [id number]  C.C.:  Follow-up for Left Lung Mass & Severe COPD.  HPI Severe COPD:  She admits she is only using her Symbicort on an as needed basis. She reports rarely using her rescue inhaler. She has had decreased coughing since her procedure. No wheezing.   Left Lung Mass: Hypermetabolic mass present with than left lower lobe measuring 6.5 cm. EBUS biopsies negative for malignancy. Denies any hemoptysis. No chest pain or pressure.   Review of Systems No fever or chills. No new focal vision loss, weakness, numbness, or tingling.   No Known Allergies  Current Outpatient Prescriptions on File Prior to Visit  Medication Sig Dispense Refill  . albuterol (PROVENTIL HFA) 108 (90 Base) MCG/ACT inhaler Inhale 2 puffs into the lungs 2 (two) times daily as needed for shortness of breath. 8 g 5  . amLODipine (NORVASC) 5 MG tablet Take 1 tablet (5 mg total) by mouth daily. 30 tablet 5  . aspirin EC 81 MG tablet Take 81 mg by mouth daily.    Marland Kitchen atorvastatin (LIPITOR) 10 MG tablet Take 1 tablet (10 mg total) by mouth daily at 6 PM. Reported on 01/29/2016 30 tablet 11  . omeprazole (PRILOSEC) 40 MG capsule Take 1 capsule (40 mg total) by mouth daily. 90 capsule 3  . Sennosides (EX-LAX) 15 MG TABS Take 15 mg by mouth daily as needed (For constipation.).     Marland Kitchen SYMBICORT 80-4.5 MCG/ACT inhaler Inhale 2 puffs into the lungs 2 (two) times daily as needed (FOR SOB). (Patient taking differently: Inhale 2 puffs into the lungs 2 (two) times daily as needed (For shortness of breath.). ) 1 Inhaler 11   No current facility-administered medications on file prior to visit.    Past Medical History  Diagnosis Date  . COPD (chronic obstructive pulmonary disease) ([place])   . Hypertension   . GERD (gastroesophageal reflux disease)   . Cataract   . Fall   . History of esophageal stricture   . Hiatal hernia   . Osteoporosis   .  Abdominal aortic aneurysm (AAA) ([place])   . Peripheral arterial disease ([place])   . Coronary artery disease     Past Surgical History  Procedure Laterality Date  . Eye surgery    . Colonoscopy    . Esophagogastroduodenoscopy (egd) with esophageal dilation    . Cataract extraction    . Esopheal dilation  02-26-16  . Endobronchial ultrasound Bilateral 03/28/2016    Procedure: ENDOBRONCHIAL ULTRASOUND;  Surgeon: [name], MD;  Location: [facility] ENDOSCOPY;  Service: Cardiopulmonary;  Laterality: Bilateral;  dr. Ashok Cordia insists on mac not general anesthesia    Family History  Problem Relation Age of Onset  . Breast cancer Mother 80  . Alcohol abuse Father   . Emphysema Father   . Early death Brother     MVA  . Hypertension Paternal Grandmother   . Rheumatologic disease Neg Hx     Social History   Social History  . Marital Status: Divorced    Spouse Name: N/A  . Number of Children: 83  . Years of Education: N/A   Occupational History  . Retired     Glass blower/designer, Clinical cytogeneticist   Social History Main Topics  . Smoking status: Former Smoker -- 1.50 packs/day for 60 years    Types: Cigarettes    Quit date: 02/01/2016  . Smokeless tobacco: Never  Used  . Alcohol Use: 0.0 oz/week    0 Standard drinks or equivalent per week     Comment: 1-2 glasses of wine daily  . Drug Use: No  . Sexual Activity: No   Other Topics Concern  . None   Social History Narrative   Divorced. Recently moved to [place] from [place], and lives with her daughter and son-in-law.   HS diploma. Retired.    Former smoker: Quit February 2017, with at least 55-pack-year history.   Occasional glass of wine, no drug use.   Caffeinated beverages, takes a daily vitamin.   Wears her seatbelt. Has dentures.   Smoke detector in the home.   Feels safe in her relationships.      [place] [facility]:   From [place], [place]. Previously has lived in [place]. [place] [facility] [place]. 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 No pets currently. No bird exposure. No mold exposure.       Objective:   Physical Exam BP 150/72 mmHg  Pulse 101  Ht '5\' 4"'$  (1.626 m)  Wt 120 lb 12.8 oz (54.795 kg)  BMI 20.73 kg/m2  SpO2 93% General:  Awake. Alert. No acute distress. Accompanied by family today. Integument:  Warm & dry. No rash on exposed skin. Bruising on the dorsum of bilateral hands. Lymphatics:  No appreciated cervical or supraclavicular lymphadenoapthy. HEENT:  Moist mucus membranes. No oral ulcers. No scleral injection.  Cardiovascular:  Regular rate. No JVD. No edema. Pulmonary:  Bilaterally on auscultation. Normal work of breathing on room air. Speaking in complete sentences. Abdomen: Soft. Normal bowel sounds. Nondistended.   PFT 03/11/16: FVC 1.76 L (68%) FEV1 0.88 L (46%) FEV1/FVC 0.50 FEF 25-75 0.34 L (25%) no bronchodilator response TLC 4.96 L (98%) RV 83% ERV 245% DLCO uncorrected 23%  6MWT 03/31/16: Walked 156 meters / Baseline Sat 92% on RA / Nadir Sat 90% on RA (stopped twice for calf pain)  IMAGING PET CT 03/15/16 (per radiologist): Hypermetabolic posterior mediastinal lymph node between left atrium & descending thoracic aorta measuring 9 mm with maximum SUV 5.87. Increased uptake left hilar region with maximum SUV 4.47. Right hilar lymph node with a max SUV 3.6. Subcarinal lymph node with maximum SUV 3.4. Left lower lobe mass measuring 6.5 cm with maximum SUV 16.6. No other areas of metastasis identified.  CXR PA/LAT 12/10/13 (personally reviewed by me): No evidence of nodule or opacity on my review of imaging. No pleural effusion. Heart normal in size. Mediastinum normal in contour.  CT CHEST W/ 02/16/16 (personally reviewed by me): 6-1/2 cm mass in left lower lobe. No pathologic mediastinal or hilar adenopathy appreciated. No pleural effusion or thickening. No other parenchymal nodule or opacity appreciated. No pericardial  effusion.  CARDIAC TTE (01/30/16): LV normal in size. EF 65-70%. Normal wall motion. Grade 1 diastolic dysfunction. LA mildly dilated. RA normal in size. RV normal in size and function. Pulmonary artery systolic pressure 20 mmHg. Moderate aortic stenosis without regurgitation. Mild mitral regurgitation. No pulmonic regurgitation. Trivial tricuspid regurgitation. No pericardial effusion.   PATHOLOGY EBUS FNA 11L & 7 (03/28/16): No malignant cells identified  LABS 02/10/16 CBC: 6.7/14.9/44.0/452 BMP: 137/3.9/99/24/7/0.58/84/8.9 LFT: 3.7/6.8/0.4/102/15/12    Assessment & Plan:  82 year old female with Left lower lobe lung mass & severe COPD. Based upon patient's spirometry I do not believe she is a candidate for resection if her lung mass is indeed malignancy as I suspect. She  will require CT-guided lung biopsy for this mass given her negative mediastinal lymph node sampling. These lymph nodes do not appear to be amenable to mediastinoscopy in my opinion. I instructed the patient to notify my office if she had any new breathing problems before her next appointment.  1. Left lower lobe lung mass: Referring patient for CT-guided lung biopsy of left lower lobe mass as soon as possible. MRI and further referrals based on biopsy results. 2. Severe COPD: Patient counseled to use Symbicort twice daily as prescribed. Repeat spirometry with bronchodilator challenge & DLCO at next appointment. 3. Follow-up: Return to clinic in 1-2 months or sooner if needed.  Sonia Baller Ashok Cordia, M.D. [facility] Pulmonary & Critical Care Pager:  [phone] After 3pm or if no response, call [phone] 1:32 PM 03/31/2016

## 2016-03-31 NOTE — Progress Notes (Signed)
PFT 03/11/16: FVC 1.76 L (68%) FEV1 0.88 L (46%) FEV1/FVC 0.50 FEF 25-75 0.34 L (25%) no bronchodilator response TLC 4.96 L (98%) RV 83% ERV 245% DLCO uncorrected 23%  6MWT 03/31/16:  Walked 156 meters / Baseline Sat 92% on RA / Nadir Sat 90% on RA (stopped twice for calf pain)  IMAGING PET CT 03/15/16 (per radiologist): Hypermetabolic posterior mediastinal lymph node between left atrium & descending thoracic aorta measuring 9 mm with maximum SUV 5.87. Increased uptake left hilar region with maximum SUV 4.47. Right hilar lymph node with a max SUV 3.6. Subcarinal lymph node with maximum SUV 3.4. Left lower lobe mass measuring 6.5 cm with maximum SUV 16.6. No other areas of metastasis identified.  PATHOLOGY EBUS FNA 11L & 7 (03/28/16): No malignant cells identified

## 2016-03-31 NOTE — Patient Instructions (Addendum)
   Remember to use your Symbicort inhaler 2 puffs twice daily as prescribed.  Please call me if you have any new breathing problems before your next appointment  We'll be setting you up for a biopsy of your lung mass and I will contact you with this result once available. We will refer you to cancer specialist once we have the biopsy result.  I will see you back in 1-2 months or sooner if needed.  TESTS ORDERED: 1. Spirometry with bronchodilator challenge and DLCO at next appointment

## 2016-04-06 ENCOUNTER — Other Ambulatory Visit: Payer: Self-pay | Admitting: Radiology

## 2016-04-08 ENCOUNTER — Ambulatory Visit (HOSPITAL_COMMUNITY)
Admission: RE | Admit: 2016-04-08 | Discharge: 2016-04-08 | Disposition: A | Discharge status: Home / Self Care | Payer: Medicare Other | Source: Ambulatory Visit | Attending: Pulmonary Disease | Admitting: Pulmonary Disease

## 2016-04-08 ENCOUNTER — Encounter (HOSPITAL_COMMUNITY): Payer: Self-pay

## 2016-04-08 DIAGNOSIS — J449 Chronic obstructive pulmonary disease, unspecified: Secondary | ICD-10-CM | POA: Diagnosis not present

## 2016-04-08 DIAGNOSIS — Z7982 Long term (current) use of aspirin: Secondary | ICD-10-CM | POA: Insufficient documentation

## 2016-04-08 DIAGNOSIS — Z7951 Long term (current) use of inhaled steroids: Secondary | ICD-10-CM | POA: Diagnosis not present

## 2016-04-08 DIAGNOSIS — R918 Other nonspecific abnormal finding of lung field: Secondary | ICD-10-CM

## 2016-04-08 DIAGNOSIS — I10 Essential (primary) hypertension: Secondary | ICD-10-CM | POA: Diagnosis not present

## 2016-04-08 DIAGNOSIS — I739 Peripheral vascular disease, unspecified: Secondary | ICD-10-CM | POA: Insufficient documentation

## 2016-04-08 DIAGNOSIS — K219 Gastro-esophageal reflux disease without esophagitis: Secondary | ICD-10-CM | POA: Diagnosis not present

## 2016-04-08 DIAGNOSIS — I2510 Atherosclerotic heart disease of native coronary artery without angina pectoris: Secondary | ICD-10-CM | POA: Insufficient documentation

## 2016-04-08 DIAGNOSIS — Z803 Family history of malignant neoplasm of breast: Secondary | ICD-10-CM | POA: Insufficient documentation

## 2016-04-08 DIAGNOSIS — Z79899 Other long term (current) drug therapy: Secondary | ICD-10-CM | POA: Diagnosis not present

## 2016-04-08 DIAGNOSIS — Z825 Family history of asthma and other chronic lower respiratory diseases: Secondary | ICD-10-CM | POA: Diagnosis not present

## 2016-04-08 DIAGNOSIS — I714 Abdominal aortic aneurysm, without rupture: Secondary | ICD-10-CM | POA: Insufficient documentation

## 2016-04-08 DIAGNOSIS — Z87891 Personal history of nicotine dependence: Secondary | ICD-10-CM | POA: Diagnosis not present

## 2016-04-08 DIAGNOSIS — C3432 Malignant neoplasm of lower lobe, left bronchus or lung: Secondary | ICD-10-CM | POA: Diagnosis not present

## 2016-04-08 LAB — PROTIME-INR
INR: 1.01 (ref 0.00–1.49)
PROTHROMBIN TIME: 13.5 s (ref 11.6–15.2)

## 2016-04-08 LAB — CBC
HCT: 38.3 % (ref 36.0–46.0)
HEMOGLOBIN: 12.6 g/dL (ref 12.0–15.0)
MCH: 29 pg (ref 26.0–34.0)
MCHC: 32.9 g/dL (ref 30.0–36.0)
MCV: 88.2 fL (ref 78.0–100.0)
PLATELETS: 332 10*3/uL (ref 150–400)
RBC: 4.34 MIL/uL (ref 3.87–5.11)
RDW: 15.7 % — ABNORMAL HIGH (ref 11.5–15.5)
WBC: 7.3 10*3/uL (ref 4.0–10.5)

## 2016-04-08 LAB — APTT: aPTT: 28 seconds (ref 24–37)

## 2016-04-08 MED ORDER — FENTANYL CITRATE (PF) 100 MCG/2ML IJ SOLN
INTRAMUSCULAR | Status: AC
  Filled 2016-04-08: qty 4

## 2016-04-08 MED ORDER — LIDOCAINE HCL 1 % IJ SOLN
INTRAMUSCULAR | Status: AC
  Filled 2016-04-08: qty 20

## 2016-04-08 MED ORDER — FENTANYL CITRATE (PF) 100 MCG/2ML IJ SOLN
INTRAMUSCULAR | Status: AC | PRN
  Administered 2016-04-08: 25 ug via INTRAVENOUS
  Administered 2016-04-08: 50 ug via INTRAVENOUS

## 2016-04-08 MED ORDER — SODIUM CHLORIDE 0.9 % IV SOLN: Freq: Once | INTRAVENOUS | Status: DC

## 2016-04-08 MED ORDER — SODIUM CHLORIDE 0.9 % IV SOLN
INTRAVENOUS | Status: AC | PRN
  Administered 2016-04-08: 10 mL/h via INTRAVENOUS

## 2016-04-08 MED ORDER — MIDAZOLAM HCL 2 MG/2ML IJ SOLN
INTRAMUSCULAR | Status: AC | PRN
  Administered 2016-04-08 (×2): 1 mg via INTRAVENOUS

## 2016-04-08 MED ORDER — MIDAZOLAM HCL 2 MG/2ML IJ SOLN
INTRAMUSCULAR | Status: AC
Start: 2016-04-08 — End: 2016-04-08
  Filled 2016-04-08: qty 4

## 2016-04-08 MED ORDER — HYDROCODONE-ACETAMINOPHEN 5-325 MG PO TABS
1.0000 | ORAL_TABLET | ORAL | Status: DC | PRN
  Filled 2016-04-08: qty 2

## 2016-04-08 NOTE — Sedation Documentation (Signed)
Patient is resting comfortably. 

## 2016-04-08 NOTE — H&P (Signed)
Chief Complaint: Patient was seen in consultation today for left lung mass biopsy at the request of Nestor,Jennings E  Referring Physician(s): Nestor,Jennings E  Supervising Physician: Aletta Edouard  Patient Status: Out-pt  History of Present Illness: Carol [name] is a 82 y.o. female   Pt was seen 01/29/2016 for abdominal pain in ED Discovered AAA Incidental Left lung mass was revealed CT Chest 3/14:  IMPRESSION: 6.5 cm mass in the superior segment of the left lower lobe most compatible with primary lung cancer.  No visible adenopathy based on lymph node sites.  Advanced coronary artery and aortic atherosclerosis.  PET 03/15/2016: IMPRESSION: 1. There is intense FDG uptake associated with the left lower lobe lung mass compatible with primary bronchogenic carcinoma. 2. Hypermetabolic ipsilateral mediastinal and hilar lymph nodes suspicious for metastatic adenopathy. There is nonspecific increased uptake associated with the contralateral hilar node and sub- carinal lymph node. Correlation with tissue sampling suggested. 3. No evidence for metastatic disease to the neck, abdomen or pelvis. 4. Aortic atherosclerosis, 3 vessel coronary artery calcification an infrarenal abdominal aortic aneurysm. Recommend followup by [place] in 1 Year  EBUS 03/28/16 was negative Now request from Dr Ashok Cordia for needle biopsy of LLL lesion  Quit smoking now 2 months  Past Medical History  Diagnosis Date  . COPD (chronic obstructive pulmonary disease) ([place])   . Hypertension   . GERD (gastroesophageal reflux disease)   . Cataract   . Fall   . History of esophageal stricture   . Hiatal hernia   . Osteoporosis   . Abdominal aortic aneurysm (AAA) ([place])   . Peripheral arterial disease ([place])   . Coronary artery disease     Past Surgical History  Procedure Laterality Date  . Eye surgery    . Colonoscopy    . Esophagogastroduodenoscopy (egd) with esophageal dilation    . Cataract  extraction    . Esopheal dilation  02-26-16  . Endobronchial ultrasound Bilateral 03/28/2016    Procedure: ENDOBRONCHIAL ULTRASOUND;  Surgeon: [name], MD;  Location: [facility];  Service: Cardiopulmonary;  Laterality: Bilateral;  dr. Ashok Cordia insists on mac not general anesthesia    Allergies: Review of patient's allergies indicates no known allergies.  Medications: Prior to Admission medications   Medication Sig Start Date End Date Taking? Authorizing Provider  albuterol (PROVENTIL HFA) 108 (90 Base) MCG/ACT inhaler Inhale 2 puffs into the lungs 2 (two) times daily as needed for shortness of breath. 02/10/16 02/09/17 Yes Renee A Kuneff, DO  amLODipine (NORVASC) 5 MG tablet Take 1 tablet (5 mg total) by mouth daily. 02/10/16 02/09/17 Yes Renee A Kuneff, DO  aspirin EC 81 MG tablet Take 81 mg by mouth daily.   Yes Historical Provider, MD  atorvastatin (LIPITOR) 10 MG tablet Take 1 tablet (10 mg total) by mouth daily at 6 PM. Reported on 01/29/2016 02/19/16  Yes Minus Breeding, MD  omeprazole (PRILOSEC) 40 MG capsule Take 1 capsule (40 mg total) by mouth daily. 02/26/16  Yes Milus Banister, MD  Sennosides (EX-LAX) 15 MG TABS Take 15 mg by mouth daily as needed (For constipation.).    Yes Historical Provider, MD  SYMBICORT 80-4.5 MCG/ACT inhaler Inhale 2 puffs into the lungs 2 (two) times daily as needed (FOR SOB). Patient taking differently: Inhale 2 puffs into the lungs 2 (two) times daily as needed (For shortness of breath.).  02/10/16  Yes Renee A Kuneff, DO     Family History  Problem Relation Age of Onset  .  Breast cancer Mother 97  . Alcohol abuse Father   . Emphysema Father   . Early death Brother     MVA  . Hypertension Paternal Grandmother   . Rheumatologic disease Neg Hx     Social History   Social History  . Marital Status: Divorced    Spouse Name: N/A  . Number of Children: 74  . Years of Education: N/A   Occupational History  . Retired     Glass blower/designer, Clinical cytogeneticist    Social History Main Topics  . Smoking status: Former Smoker -- 1.50 packs/day for 60 years    Types: Cigarettes    Quit date: 02/01/2016  . Smokeless tobacco: Never Used  . Alcohol Use: 0.0 oz/week    0 Standard drinks or equivalent per week     Comment: 1-2 glasses of wine daily  . Drug Use: No  . Sexual Activity: No   Other Topics Concern  . None   Social History Narrative   Divorced. Recently moved to [place] from [place], and lives with her daughter and son-in-law.   HS diploma. Retired.    Former smoker: Quit February 2017, with at least 55-pack-year history.   Occasional glass of wine, no drug use.   Caffeinated beverages, takes a daily vitamin.   Wears her seatbelt. Has dentures.   Smoke detector in the home.   Feels safe in her relationships.      [facility]:   From [place], [place]. Previously has lived in [place]. She moved to [facility] in 2013. 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 No pets currently. No bird exposure. No mold exposure.      Review of Systems: A 12 point ROS discussed and pertinent positives are indicated in the HPI above.  All other systems are negative.  Review of Systems  Constitutional: Positive for activity change. Negative for diaphoresis and fatigue.  Respiratory: Positive for shortness of breath.   Neurological: Negative for weakness.  Psychiatric/Behavioral: Negative for behavioral problems and confusion.    Vital Signs: BP 182/86 mmHg  Pulse 91  Temp(Src) 97.3 F (36.3 C) (Oral)  Resp 18  Ht '5\' 4"'$  (1.626 m)  Wt 117 lb (53.071 kg)  BMI 20.07 kg/m2  SpO2 94%  Physical Exam  Constitutional: She is oriented to person, place, and time.  Cardiovascular: Normal rate, regular rhythm and normal heart sounds.   Pulmonary/Chest: Effort normal and breath sounds normal. She has no wheezes.  Abdominal: Soft. Bowel sounds are normal.  Musculoskeletal: Normal range of motion.    Neurological: She is alert and oriented to person, place, and time.  Skin: Skin is warm and dry.  Psychiatric: She has a normal mood and affect. Her behavior is normal. Judgment and thought content normal.  Nursing note and vitals reviewed.   Mallampati Score:  MD Evaluation Airway: WNL Heart: WNL Abdomen: WNL Chest/ Lungs: WNL ASA  Classification: 3 Mallampati/Airway Score: Two  Imaging: Nm Pet Image Initial (pi) Skull Base To Thigh  03/15/2016  CLINICAL DATA:  Initial treatment strategy for lung mass. EXAM: NUCLEAR MEDICINE PET SKULL BASE TO THIGH TECHNIQUE: 5.65 mCi F-18 FDG was injected intravenously. Full-ring PET imaging was performed from the skull base to thigh after the radiotracer. CT data was obtained and used for attenuation correction and anatomic localization. FASTING BLOOD GLUCOSE:  Value: 93 mg/dl COMPARISON:  None FINDINGS: NECK No hypermetabolic lymph nodes in the neck. CHEST There is  aortic atherosclerosis noted. Three vessel coronary artery calcification identified. Hypermetabolic posterior mediastinal lymph node is identified between the left atrium and descending thoracic aorta. This measures 9 mm and has an SUV max equal to 5.87. Increased uptake within the left hilar region has an SUV max equal to 4.47. Mild increased uptake associated with the right hilar node has an SUV max equal to 3.6. Sub- carinal lymph node has an SUV max equal to 3.4 The large mass involving the left lower lobe measures 6.5 cm and has an SUV max equal to 16.6, image 33 of series 6. Scar versus atelectasis noted in the right middle lobe ABDOMEN/PELVIS No abnormal uptake identified within the liver, spleen, pancreas or adrenal glands. No hypermetabolic lymph nodes within the upper abdomen. Aortic atherosclerosis noted. Infrarenal abdominal aortic aneurysm measures 4.1 cm, image 127 of series 4. SKELETON No focal hypermetabolic activity to suggest skeletal metastasis. IMPRESSION: 1. There is intense  FDG uptake associated with the left lower lobe lung mass compatible with primary bronchogenic carcinoma. 2. Hypermetabolic ipsilateral mediastinal and hilar lymph nodes suspicious for metastatic adenopathy. There is nonspecific increased uptake associated with the contralateral hilar node and sub- carinal lymph node. Correlation with tissue sampling suggested. 3. No evidence for metastatic disease to the neck, abdomen or pelvis. 4. Aortic atherosclerosis, 3 vessel coronary artery calcification an infrarenal abdominal aortic aneurysm. Recommend followup by [place] in 1 year. This recommendation follows ACR consensus guidelines: White Paper of the ACR Incidental Findings Committee II on Vascular Findings. J Am Coll Radiol 2013; 10:789-794. Electronically Signed   By: Kerby Moors M.D.   On: 03/15/2016 14:27   Dg Chest Port 1 View  03/28/2016  CLINICAL DATA:  Mass left lower lobe. Status post mediastinal lymph node biopsy. EXAM: PORTABLE CHEST 1 VIEW COMPARISON:  01/29/2016.  PET CT 03/15/2016. FINDINGS: Mass in the superior segment of the left lower lobe again noted, stable. No pneumothorax. Hyperinflation of the lungs. Heart is normal size. No effusions or acute bony abnormality. IMPRESSION: Mass in the superior segment of the left lower lobe. No pneumothorax. Electronically Signed   By: Rolm Baptise M.D.   On: 03/28/2016 12:00    Labs:  CBC:  Recent Labs  01/30/16 0314 01/31/16 0353 02/10/16 1412 04/08/16 1004  WBC 5.5 5.2 6.7 7.3  HGB 12.2 10.8* 14.9 12.6  HCT 37.8 33.8* 44.0 38.3  PLT 250 215 452* 332    COAGS:  Recent Labs  01/30/16 0314 04/08/16 1004  INR 1.16 1.01  APTT 29 28    BMP:  Recent Labs  01/29/16 1525 01/30/16 0314 01/31/16 0353 02/10/16 1412  NA 133* 134* 136 137  K 4.0 3.5 3.8 3.9  CL 94* 102 106 99  CO2 22 20* 21* 24  GLUCOSE 100* 76 85 84  BUN 45* 45* 27* 7  CALCIUM 9.4 7.6* 7.8* 8.9  CREATININE 4.14* 3.73* 1.29* 0.58*  GFRNONAA 9* 10* 38*  --   GFRAA  11* 12* 44*  --     LIVER FUNCTION TESTS:  Recent Labs  01/29/16 1525 01/30/16 0314 01/31/16 0353 02/10/16 1412  BILITOT 0.5 0.4 0.3 0.4  AST '23 16 17 15  '$ ALT 15 14 12* 12  ALKPHOS 94 78 70 102  PROT 6.9 5.3* 4.8* 6.8  ALBUMIN 3.6 2.7* 2.4* 3.7    TUMOR MARKERS: No results for input(s): AFPTM, CEA, CA199, CHROMGRNA in the last 8760 hours.  Assessment and Plan:  Left lung mass  EBUS negative Pet + Now  scheduled for needle bx Risks and Benefits discussed with the patient including, but not limited to bleeding, hemoptysis, respiratory failure requiring intubation, infection, pneumothorax requiring chest tube placement, stroke from air embolism or even death. All of the patient's questions were answered, patient is agreeable to proceed. Consent signed and in chart.   Thank you for this interesting consult.  I greatly enjoyed meeting Carol [name] and look forward to participating in their care.  A copy of this report was sent to the requesting provider on this date.  Electronically Signed: Davi Rotan A 04/08/2016, 10:36 AM   I spent a total of  30 Minutes   in face to face in clinical consultation, greater than 50% of which was counseling/coordinating care for left lung mass bx

## 2016-04-08 NOTE — Discharge Instructions (Signed)

## 2016-04-08 NOTE — Sedation Documentation (Signed)
Patient denies pain and is resting comfortably.  

## 2016-04-08 NOTE — Procedures (Signed)
Interventional Radiology Procedure Note  Procedure:  CT guided core biopsy of LLL lung mass  Complications:  None  Estimated Blood Loss: < 10 mL  18 G core biopsy x 2 via 17 G needle of large LLL lung mass.  Solid tissue obtained. No PTX.  Venetia Night. Kathlene Cote, M.D Pager:  [phone]

## 2016-04-08 NOTE — Sedation Documentation (Signed)
Patient is resting comfortably. Pt reports some discomfort additional medication given as discussed with Dr. Oletta Darter

## 2016-04-11 ENCOUNTER — Telehealth: Payer: Self-pay | Admitting: Pulmonary Disease

## 2016-04-11 DIAGNOSIS — C3492 Malignant neoplasm of unspecified part of left bronchus or lung: Secondary | ICD-10-CM

## 2016-04-11 NOTE — Telephone Encounter (Signed)
Patient notified via Dr. Ashok Cordia. Orders have been placed per Dr. Ammie Dalton request. Nothing further needed.

## 2016-04-11 NOTE — Telephone Encounter (Signed)
Spoke with Varney Biles in [facility]. Dr. Lyndon Code would like to speak to Dr. Ashok Cordia about this pt. Will route message to JN to address.

## 2016-04-11 NOTE — Telephone Encounter (Signed)
Pt's son in law is calling about results from CT biopsy. Pt is going to be out of town and would like [place] to call pt's cell phone with results.  JN - please advise. Thanks.

## 2016-04-11 NOTE — Telephone Encounter (Signed)
Called and spoke with patient's Son and updated them on the result of the biopsy showing NSCLC. Patient will need the following and I informed him that we would be in contact to schedule.  1. MRI Brain with and without contrast 2. Referral to Radiation Oncology 3. Referral to Medical Oncology

## 2016-04-12 ENCOUNTER — Encounter: Payer: Self-pay | Admitting: Radiation Oncology

## 2016-04-13 ENCOUNTER — Telehealth: Payer: Self-pay | Admitting: Pulmonary Disease

## 2016-04-13 ENCOUNTER — Other Ambulatory Visit: Payer: Self-pay | Admitting: Pulmonary Disease

## 2016-04-13 ENCOUNTER — Telehealth: Payer: Self-pay | Admitting: *Deleted

## 2016-04-13 DIAGNOSIS — R918 Other nonspecific abnormal finding of lung field: Secondary | ICD-10-CM

## 2016-04-13 DIAGNOSIS — I10 Essential (primary) hypertension: Secondary | ICD-10-CM

## 2016-04-13 DIAGNOSIS — N179 Acute kidney failure, unspecified: Secondary | ICD-10-CM

## 2016-04-13 NOTE — Telephone Encounter (Signed)
Spoke with pt's son-in-law and advised that MRI was ordered for brain to likely check for any spread of her NSCLC. He was very appreciative of the long conversation he had with JN. He is requesting meds for pt's MRI as she is claustrophobic.  JN please advise as to RX for pt's scan. Thanks!

## 2016-04-13 NOTE — Telephone Encounter (Signed)
Oncology Nurse Navigator Documentation  Oncology Nurse Navigator Flowsheets 04/13/2016  Navigator Encounter Type Telephone  Telephone Outgoing Call  Barriers/Navigation Needs Coordination of Care  Interventions Coordination of Care  Coordination of Care Appts  Acuity Level 1  Time Spent with Patient 15   I received referral from Dr. Ammie Dalton office.  I called and left a vm message to call with my name and phone number

## 2016-04-13 NOTE — Telephone Encounter (Signed)
Oncology Nurse Navigator Documentation  Oncology Nurse Navigator Flowsheets 04/13/2016  Navigator Encounter Type Telephone  Telephone Outgoing Call  Barriers/Navigation Needs Coordination of Care  Interventions Coordination of Care  Coordination of Care Appts  Acuity Level 1  Time Spent with Patient 15   Patients son called.  Patient is set up to see Dr. Julien Nordmann on 04/25/16 with arrival at 1:45.  I offered an earlier appt but he was unable to make it.

## 2016-04-14 MED ORDER — LORAZEPAM 0.5 MG PO TABS
ORAL_TABLET | ORAL | Status: DC
Start: 2016-04-14 — End: 2016-08-09

## 2016-04-14 NOTE — Telephone Encounter (Signed)
Spoke with Son-in-law [name] , aware of rec's per [facility] Rx called into [facility]. Nothing further needed.

## 2016-04-14 NOTE — Telephone Encounter (Signed)
Yes the MRI is to make sure that the cancer hasn't spread to her brain. We can do a prescription for Ativan 0.'5mg'$  tablet - 1 tablet 30 minutes prior to MRI & may take second tablet if still too anxious at the time of the MRI. #2 No refills. JN

## 2016-04-14 NOTE — Telephone Encounter (Signed)
Dr. Ashok Cordia, were you able to speak with Dr. Lyndon Code?

## 2016-04-14 NOTE — Telephone Encounter (Signed)
Yes I was able to speak with him. JN

## 2016-04-18 ENCOUNTER — Other Ambulatory Visit (INDEPENDENT_AMBULATORY_CARE_PROVIDER_SITE_OTHER): Payer: Medicare Other

## 2016-04-18 DIAGNOSIS — N179 Acute kidney failure, unspecified: Secondary | ICD-10-CM | POA: Diagnosis not present

## 2016-04-18 DIAGNOSIS — R918 Other nonspecific abnormal finding of lung field: Secondary | ICD-10-CM

## 2016-04-18 DIAGNOSIS — I10 Essential (primary) hypertension: Secondary | ICD-10-CM

## 2016-04-18 LAB — CREATININE, SERUM: Creatinine, Ser: 0.59 mg/dL (ref 0.40–1.20)

## 2016-04-18 LAB — BUN: BUN: 11 mg/dL (ref 6–23)

## 2016-04-20 ENCOUNTER — Ambulatory Visit: Payer: Medicare Other

## 2016-04-20 ENCOUNTER — Ambulatory Visit: Payer: Medicare Other | Admitting: Radiation Oncology

## 2016-04-22 ENCOUNTER — Ambulatory Visit (HOSPITAL_COMMUNITY)
Admission: RE | Admit: 2016-04-22 | Discharge: 2016-04-22 | Disposition: A | Discharge status: Home / Self Care | Payer: Medicare Other | Source: Ambulatory Visit | Attending: Pulmonary Disease | Admitting: Pulmonary Disease

## 2016-04-22 DIAGNOSIS — C3492 Malignant neoplasm of unspecified part of left bronchus or lung: Secondary | ICD-10-CM | POA: Insufficient documentation

## 2016-04-22 DIAGNOSIS — R220 Localized swelling, mass and lump, head: Secondary | ICD-10-CM | POA: Insufficient documentation

## 2016-04-22 MED ORDER — GADOBENATE DIMEGLUMINE 529 MG/ML IV SOLN
12.0000 mL | Freq: Once | INTRAVENOUS | Status: AC | PRN
  Administered 2016-04-22: 12 mL via INTRAVENOUS

## 2016-04-25 ENCOUNTER — Encounter: Payer: Self-pay | Admitting: *Deleted

## 2016-04-25 ENCOUNTER — Encounter: Payer: Self-pay | Admitting: Internal Medicine

## 2016-04-25 ENCOUNTER — Ambulatory Visit (HOSPITAL_BASED_OUTPATIENT_CLINIC_OR_DEPARTMENT_OTHER): Payer: Medicare Other | Admitting: Internal Medicine

## 2016-04-25 ENCOUNTER — Telehealth: Payer: Self-pay | Admitting: *Deleted

## 2016-04-25 ENCOUNTER — Other Ambulatory Visit (HOSPITAL_BASED_OUTPATIENT_CLINIC_OR_DEPARTMENT_OTHER): Payer: Medicare Other

## 2016-04-25 ENCOUNTER — Telehealth: Payer: Self-pay | Admitting: Internal Medicine

## 2016-04-25 VITALS — BP 184/89 | HR 90 | Temp 97.7°F | Resp 18 | Ht 64.0 in | Wt 119.6 lb

## 2016-04-25 DIAGNOSIS — G939 Disorder of brain, unspecified: Secondary | ICD-10-CM

## 2016-04-25 DIAGNOSIS — C3492 Malignant neoplasm of unspecified part of left bronchus or lung: Secondary | ICD-10-CM

## 2016-04-25 DIAGNOSIS — C3432 Malignant neoplasm of lower lobe, left bronchus or lung: Secondary | ICD-10-CM | POA: Diagnosis not present

## 2016-04-25 DIAGNOSIS — R918 Other nonspecific abnormal finding of lung field: Secondary | ICD-10-CM

## 2016-04-25 LAB — CBC WITH DIFFERENTIAL/PLATELET
BASO%: 1 % (ref 0.0–2.0)
Basophils Absolute: 0.1 10*3/uL (ref 0.0–0.1)
EOS%: 3.8 % (ref 0.0–7.0)
Eosinophils Absolute: 0.3 10*3/uL (ref 0.0–0.5)
HEMATOCRIT: 41.7 % (ref 34.8–46.6)
HGB: 13.7 g/dL (ref 11.6–15.9)
LYMPH#: 2.8 10*3/uL (ref 0.9–3.3)
LYMPH%: 36.8 % (ref 14.0–49.7)
MCH: 29.3 pg (ref 25.1–34.0)
MCHC: 33 g/dL (ref 31.5–36.0)
MCV: 88.8 fL (ref 79.5–101.0)
MONO#: 1 10*3/uL — ABNORMAL HIGH (ref 0.1–0.9)
MONO%: 13.4 % (ref 0.0–14.0)
NEUT%: 45 % (ref 38.4–76.8)
NEUTROS ABS: 3.4 10*3/uL (ref 1.5–6.5)
Platelets: 317 10*3/uL (ref 145–400)
RBC: 4.69 10*6/uL (ref 3.70–5.45)
RDW: 15.8 % — ABNORMAL HIGH (ref 11.2–14.5)
WBC: 7.6 10*3/uL (ref 3.9–10.3)

## 2016-04-25 LAB — COMPREHENSIVE METABOLIC PANEL
ALT: 15 U/L (ref 0–55)
AST: 18 U/L (ref 5–34)
Albumin: 3.6 g/dL (ref 3.5–5.0)
Alkaline Phosphatase: 111 U/L (ref 40–150)
Anion Gap: 11 mEq/L (ref 3–11)
BUN: 11.2 mg/dL (ref 7.0–26.0)
CHLORIDE: 101 meq/L (ref 98–109)
CO2: 25 meq/L (ref 22–29)
CREATININE: 0.8 mg/dL (ref 0.6–1.1)
Calcium: 9.4 mg/dL (ref 8.4–10.4)
EGFR: 73 mL/min/{1.73_m2} — ABNORMAL LOW (ref >90)
Glucose: 104 mg/dl (ref 70–140)
Potassium: 3.9 mEq/L (ref 3.5–5.1)
Sodium: 137 mEq/L (ref 136–145)
Total Bilirubin: 0.63 mg/dL (ref 0.20–1.20)
Total Protein: 7.8 g/dL (ref 6.4–8.3)

## 2016-04-25 MED ORDER — PROCHLORPERAZINE MALEATE 10 MG PO TABS: 10.0000 mg | ORAL_TABLET | Freq: Four times a day (QID) | ORAL | Status: DC | PRN

## 2016-04-25 NOTE — Telephone Encounter (Signed)
per pof to sch pt appt-gave pt copyof avs-MW sch trmt-gave pt copy of avs

## 2016-04-25 NOTE — Telephone Encounter (Signed)
Per staff message and POF I have scheduled appts. Advised scheduler of appts. JMW  

## 2016-04-25 NOTE — Progress Notes (Signed)
Oncology Nurse Navigator Documentation  Oncology Nurse Navigator Flowsheets 04/25/2016  Navigator Encounter Type Clinic/MDC  Patient Visit Type MedOnc  Treatment Phase Pre-Tx/Tx Discussion  Barriers/Navigation Needs Education  Education Newly Diagnosed Cancer Education  Interventions Education Method  Education Method Verbal  Acuity Level 1  Acuity Level 1 Initial guidance, education and coordination as needed  Time Spent with Patient 56   Spoke with patient and family.  Gave and explained information on disease dx and treatment.

## 2016-04-25 NOTE — Progress Notes (Signed)
[facility] [phone]   [phone]  CONSULT NOTE  REFERRING PHYSICIAN: Dr. Tera Partridge.  REASON FOR CONSULTATION:  82 years old white female recently diagnosed with lung cancer.  HPI Carol [name] is a 82 y.o. female with past medical history significant for multiple medical problems including history of COPD, hypertension, abdominal aortic aneurysm, Corgard disease, peripheral arterial disease, GERD, esophageal stricture, osteoporosis as well as long history of smoking. In February 2017 the patient was evaluated for dehydration secondary to esophageal stricture and she was admitted to the hospital with acute renal failure and received hydration during her hospitalization. During her evaluation chest x-ray was performed on 01/29/2016 and it showed around 5.3 x 5.9 cm masslike opacity in the superior segment of the left lower lobe concerning for primary bronchogenic malignancy. CT scan of the chest was performed on 02/16/2016 and it showed 6.5 cm mass in the superior segment of the left lower lobe most compatible with primary lung cancer. No visible adenopathy based on the lymph node sites. The patient was seen by Dr. Ashok Cordia and a PET scan was performed on 03/15/2016 and it showed intense FDG uptake associated with the left lower lobe lung mass compatible with primary bronchogenic carcinoma. There was also hypermetabolic ipsilateral mediastinal and hilar lymph node suspicious for metastatic adenopathy. There was also nonspecific increased uptake associated with the contralateral hilar and subcarinal lymph nodes. There is no evidence of metastatic disease to the neck, abdomen or pelvis. On 03/28/2016 the patient underwent bronchoscopy with endobronchial ultrasound and needle aspiration under the care of Dr. Ashok Cordia. The final cytology from fine needle aspiration of 11L and 7 lymph nodes showed no malignant cells. On 04/08/2016 the patient had CT-guided core  biopsy of the left lower lobe lung mass by interventional radiology and the final pathology (Accession: [id number]) was consistent with squamous cell carcinoma.  MRI of the brain on 04/22/2016 showed 5 mm enhancing mass in the hypothalamic region and inflammatory processes usually predominate in this region but solitary metastases is distended primary concern. Dr. Ashok Cordia kindly referred the patient to me today for evaluation and recommendation regarding treatment of her condition. When seen today the patient is feeling fine with no specific complaints except for shortness of breath with exertion as well as mild cough but no chest pain or hemoptysis. She lost few pound when she had the esophageal stricture but she is feeling much better and gaining weight now. She denied having any significant nausea, vomiting, diarrhea or constipation. She denied having any headache or visual changes. Family history significant for mother with breast cancer at age 23 and father died from alcoholic complication. The patient is divorced and has 9 children. She was accompanied today by her daughter Carol [name] and her son-in-law Carol [name]. She works in several jobs including transcription. She has a history of smoking more than one pack per day for around 60 years and quit inhibitor of 2017. She drinks alcohol socially and no history of drug abuse.  HPI  Past Medical History  Diagnosis Date  . COPD (chronic obstructive pulmonary disease) ([place])   . Hypertension   . GERD (gastroesophageal reflux disease)   . Cataract   . Fall   . History of esophageal stricture   . Hiatal hernia   . Osteoporosis   . Abdominal aortic aneurysm (AAA) ([place])   . Peripheral arterial disease ([place])   . Coronary artery disease     Past Surgical History  Procedure Laterality Date  .  Eye surgery    . Colonoscopy    . Esophagogastroduodenoscopy (egd) with esophageal dilation    . Cataract extraction    . Esopheal dilation  02-26-16  .  Endobronchial ultrasound Bilateral 03/28/2016    Procedure: ENDOBRONCHIAL ULTRASOUND;  Surgeon: [name], MD;  Location: [facility];  Service: Cardiopulmonary;  Laterality: Bilateral;  dr. Ashok Cordia insists on mac not general anesthesia    Family History  Problem Relation Age of Onset  . Breast cancer Mother 62  . Alcohol abuse Father   . Emphysema Father   . Early death Brother     MVA  . Hypertension Paternal Grandmother   . Rheumatologic disease Neg Hx     Social History Social History  Substance Use Topics  . Smoking status: Former Smoker -- 1.50 packs/day for 60 years    Types: Cigarettes    Quit date: 02/01/2016  . Smokeless tobacco: Never Used  . Alcohol Use: 0.0 oz/week    0 Standard drinks or equivalent per week     Comment: 1-2 glasses of wine daily    No Known Allergies  Current Outpatient Prescriptions  Medication Sig Dispense Refill  . albuterol (PROVENTIL HFA) 108 (90 Base) MCG/ACT inhaler Inhale 2 puffs into the lungs 2 (two) times daily as needed for shortness of breath. 8 g 5  . amLODipine (NORVASC) 5 MG tablet Take 1 tablet (5 mg total) by mouth daily. 30 tablet 5  . aspirin EC 81 MG tablet Take 81 mg by mouth daily.    Marland Kitchen atorvastatin (LIPITOR) 10 MG tablet Take 1 tablet (10 mg total) by mouth daily at 6 PM. Reported on 01/29/2016 30 tablet 11  . LORazepam (ATIVAN) 0.5 MG tablet Take 1 tablet 24mns prior to MRI. Can take an additional tablet right before test if needed. 2 tablet 0  . omeprazole (PRILOSEC) 40 MG capsule Take 1 capsule (40 mg total) by mouth daily. 90 capsule 3  . Sennosides (EX-LAX) 15 MG TABS Take 15 mg by mouth daily as needed (For constipation.).     .Marland KitchenSYMBICORT 80-4.5 MCG/ACT inhaler Inhale 2 puffs into the lungs 2 (two) times daily as needed (FOR SOB). (Patient taking differently: Inhale 2 puffs into the lungs 2 (two) times daily as needed (For shortness of breath.). ) 1 Inhaler 11   No current facility-administered medications for  this visit.    Review of Systems  Constitutional: negative Eyes: negative Ears, nose, mouth, throat, and face: negative Respiratory: positive for cough and dyspnea on exertion Cardiovascular: negative Gastrointestinal: negative Genitourinary:negative Integument/breast: negative Hematologic/lymphatic: negative Musculoskeletal:negative Neurological: negative Behavioral/Psych: negative Endocrine: negative Allergic/Immunologic: negative  Physical Exam  RYQM:VHQIO healthy, no distress, well nourished and well developed SKIN: skin color, texture, turgor are normal, no rashes or significant lesions HEAD: Normocephalic, No masses, lesions, tenderness or abnormalities EYES: normal, PERRLA, Conjunctiva are pink and non-injected EARS: External ears normal, Canals clear OROPHARYNX:no exudate, no erythema and lips, buccal mucosa, and tongue normal  NECK: supple, no adenopathy, no JVD LYMPH:  no palpable lymphadenopathy, no hepatosplenomegaly BREAST:not examined LUNGS: clear to auscultation , and palpation HEART: regular rate & rhythm, no murmurs and no gallops ABDOMEN:abdomen soft, non-tender, normal bowel sounds and no masses or organomegaly BACK: Back symmetric, no curvature., No CVA tenderness EXTREMITIES:no joint deformities, effusion, or inflammation, no edema, no skin discoloration  NEURO: alert & oriented x 3 with fluent speech, no focal motor/sensory deficits  PERFORMANCE STATUS: ECOG 1  LABORATORY DATA: Lab Results  Component Value Date  WBC 7.6 04/25/2016   HGB 13.7 04/25/2016   HCT 41.7 04/25/2016   MCV 88.8 04/25/2016   PLT 317 04/25/2016      Chemistry      Component Value Date/Time   NA 137 04/25/2016 1348   NA 137 02/10/2016 1412   K 3.9 04/25/2016 1348   K 3.9 02/10/2016 1412   CL 99 02/10/2016 1412   CO2 25 04/25/2016 1348   CO2 24 02/10/2016 1412   BUN 11.2 04/25/2016 1348   BUN 11 04/18/2016 1246   CREATININE 0.8 04/25/2016 1348   CREATININE 0.59  04/18/2016 1246   CREATININE 0.58* 02/10/2016 1412      Component Value Date/Time   CALCIUM 9.4 04/25/2016 1348   CALCIUM 8.9 02/10/2016 1412   ALKPHOS 111 04/25/2016 1348   ALKPHOS 102 02/10/2016 1412   AST 18 04/25/2016 1348   AST 15 02/10/2016 1412   ALT 15 04/25/2016 1348   ALT 12 02/10/2016 1412   BILITOT 0.63 04/25/2016 1348   BILITOT 0.4 02/10/2016 1412       RADIOGRAPHIC STUDIES: Mr Jeri Cos Wo Contrast  2016-05-05  CLINICAL DATA:  Non-small cell cancer of the left lung. Evaluate for metastatic disease. EXAM: MRI HEAD WITHOUT AND WITH CONTRAST TECHNIQUE: Multiplanar, multiecho pulse sequences of the brain and surrounding structures were obtained without and with intravenous contrast. CONTRAST:  48m MULTIHANCE GADOBENATE DIMEGLUMINE 529 MG/ML IV SOLN COMPARISON:  None. FINDINGS: Calvarium and upper cervical spine: No focal marrow signal abnormality. Orbits: Negative. Sinuses and Mastoids: Fluid level and frothy secretions in the right maxillary sinus. Brain: 5 mm focus of nodular enhancement along the hypothalamus, eccentric to the right. No other abnormal focus of enhancement intracranially. Age congruent small-vessel ischemic change in the cerebral white matter. Tiny cystic spaces in the left pons with susceptibility, presumably remote infarcts. No generalized hemorrhage. No acute infarct, hemorrhage, hydrocephalus, or major vessel occlusion. IMPRESSION: 5 mm enhancing mass in the hypothalamic region. Inflammatory processes usually predominate in this region and would correlate for any endocrinopathy. Given history and demographics, solitary metastasis is the primary concern and close imaging follow-up is recommended. Electronically Signed   By: JMonte FantasiaM.D.   On: 02017/05/113:50   Ct Biopsy  04/08/2016  CLINICAL DATA:  Left lower lobe lung mass and need for percutaneous biopsy. Bronchoscopy did not yield malignant cells. EXAM: CT GUIDED CORE BIOPSY OF LEFT LOWER LOBE LUNG  MASS ANESTHESIA/SEDATION: 2.0  Mg IV Versed; 75 mcg IV Fentanyl Total Moderate Sedation Time: 25 minutes. The patient's level of consciousness and physiologic status were continuously monitored during the procedure by Radiology nursing. PROCEDURE: The procedure risks, benefits, and alternatives were explained to the patient. Questions regarding the procedure were encouraged and answered. The patient understands and consents to the procedure. The posterior left chest wall was prepped with chlorhexidine in a sterile fashion, and a sterile drape was applied covering the operative field. A sterile gown and sterile gloves were used for the procedure. Local anesthesia was provided with 1% Lidocaine. CT was performed in a prone position. After localizing a left lower lobe lung mass, a 17 gauge needle was advanced into the mass under CT guidance. Two separate 18 gauge core biopsy samples were obtained and submitted in formalin. The Biosentry device was used in placing a plug at the pleural entry site. Additional CT imaging was performed. COMPLICATIONS: None FINDINGS: Large mass within the superior segment of the left lower lobe measures approximately 5.7 x 7.2 cm. Solid tissue  was obtained. There were no immediate complications. IMPRESSION: CT-guided core biopsy performed of the large left lower lobe lung mass. Electronically Signed   By: Aletta Edouard M.D.   On: 04/08/2016 14:57   Dg Chest Port 1 View  03/28/2016  CLINICAL DATA:  Mass left lower lobe. Status post mediastinal lymph node biopsy. EXAM: PORTABLE CHEST 1 VIEW COMPARISON:  01/29/2016.  PET CT 03/15/2016. FINDINGS: Mass in the superior segment of the left lower lobe again noted, stable. No pneumothorax. Hyperinflation of the lungs. Heart is normal size. No effusions or acute bony abnormality. IMPRESSION: Mass in the superior segment of the left lower lobe. No pneumothorax. Electronically Signed   By: Rolm Baptise M.D.   On: 03/28/2016 12:00    ASSESSMENT:  This is a very pleasant 82 years old white female recently diagnosed with stage IIIa (T2b, N2, M0) non-small cell lung cancer, squamous cell carcinoma, presented with large left lower lobe lung mass in addition to highly suspicious mediastinal lymphadenopathy. The patient could have a stage IV lung cancer if the hypothalamic lesion is consistent with metastatic disease.   PLAN: I had a lengthy discussion with the patient and her family today about her current disease stage, prognosis and treatment options. I will request the tissue block to be sent for PDL 1 expression.  I discussed with the patient her treatment options. I recommended for the patient a course of concurrent chemoradiation with weekly carboplatin for AUC of 2 and paclitaxel 45 MG/M2. I discussed with the patient adverse effect of the chemotherapy including but not limited to alopecia, myelosuppression, nausea and vomiting, peripheral neuropathy, liver or renal dysfunction. I will arrange for the patient to have a chemotherapy education class before starting the first dose of his chemotherapy. I will call her pharmacy with prescription for Compazine 10 mg by mouth every 6 hours as needed for nausea. The patient is scheduled to see Dr. Tammi Klippel on 04/27/2016 for discussion of the radiotherapy option. We will continue to monitor the questionable lesion in the brain to be on upcoming MRI of the brain in few months The patient is expected to start the first dose of concurrent chemoradiation on 05/09/2016. She would come back for follow-up visit in 3 weeks for evaluation and management of any adverse effect of her treatment. She was advised to call immediately if she has any concerning symptoms in the interval. The patient voices understanding of current disease status and treatment options and is in agreement with the current care plan.  All questions were answered. The patient knows to call the clinic with any problems, questions or  concerns. We can certainly see the patient much sooner if necessary.  Thank you so much for allowing me to participate in the care of Carol [name]. I will continue to follow up the patient with you and assist in her care.  I spent 55 minutes counseling the patient face to face. The total time spent in the appointment was 80 minutes.  Disclaimer: This note was dictated with voice recognition software. Similar sounding words can inadvertently be transcribed and may not be corrected upon review.   Donell Tomkins K. Apr 25, 2016, 3:11 PM

## 2016-04-26 ENCOUNTER — Other Ambulatory Visit (HOSPITAL_COMMUNITY)
Admission: RE | Admit: 2016-04-26 | Discharge: 2016-04-26 | Disposition: A | Discharge status: Home / Self Care | Payer: Medicare Other | Source: Ambulatory Visit | Attending: Internal Medicine | Admitting: Internal Medicine

## 2016-04-26 DIAGNOSIS — C3492 Malignant neoplasm of unspecified part of left bronchus or lung: Secondary | ICD-10-CM | POA: Insufficient documentation

## 2016-04-27 ENCOUNTER — Encounter: Payer: Self-pay | Admitting: Radiation Oncology

## 2016-04-27 NOTE — Progress Notes (Addendum)
Thoracic Location of Tumor / Histology: 6.5 cm left lower lobe lung mass. Squamous cell carcinoma. PLUS, a 5 mm enhancing mass in the hypothalamic region. Inflammatory processes usually predominate in this region and would correlate for any endocrinopathy  Patient presented February 2017 the patient was evaluated for dehydration secondary to esophageal stricture and she was admitted to the hospital with acute renal failure and received hydration during her hospitalization. During her evaluation chest x-ray was performed on 01/29/2016 and it showed around 5.3 x 5.9 cm masslike opacity in the superior segment of the left lower lobe concerning for primary bronchogenic malignancy.  Biopsies of LLL mass (if applicable) revealed:    Tobacco/Marijuana/Snuff/ETOH use: Former Smoker -- 1.50 packs/day for 60 years  Past/Anticipated interventions by cardiothoracic surgery, if any: EGD with dilation and endobronchial ultrasound   Past/Anticipated interventions by medical oncology, if any: course of concurrent chemoradiation with weekly carboplatin for AUC of 2 and paclitaxel 45 MG/M2. patient is expected to start the first dose of concurrent chemoradiation on 05/09/2016.  Signs/Symptoms  Weight changes, if any: lost 11 lb over a 3-4 week period of which she has gained back since her esophageal  dilation  Respiratory complaints, if any: mild cough and SOB with exertion  Hemoptysis, if any: no  Pain issues, if any:  no  SAFETY ISSUES:  Prior radiation? no  Pacemaker/ICD? no   Possible current pregnancy?no  Is the patient on methotrexate? no  Current Complaints / other details:  82 year old female. Divorced. Lives with daughter and son in law. Retired. AAA under observation "since its small."

## 2016-04-28 ENCOUNTER — Encounter: Payer: Self-pay | Admitting: Radiation Oncology

## 2016-04-28 ENCOUNTER — Ambulatory Visit
Admission: RE | Admit: 2016-04-28 | Discharge: 2016-04-28 | Disposition: A | Discharge status: Home / Self Care | Payer: Medicare Other | Source: Ambulatory Visit | Attending: Radiation Oncology | Admitting: Radiation Oncology

## 2016-04-28 VITALS — BP 184/65 | HR 94 | Resp 16 | Wt 121.1 lb

## 2016-04-28 DIAGNOSIS — K449 Diaphragmatic hernia without obstruction or gangrene: Secondary | ICD-10-CM | POA: Diagnosis not present

## 2016-04-28 DIAGNOSIS — C3492 Malignant neoplasm of unspecified part of left bronchus or lung: Secondary | ICD-10-CM

## 2016-04-28 DIAGNOSIS — J449 Chronic obstructive pulmonary disease, unspecified: Secondary | ICD-10-CM | POA: Insufficient documentation

## 2016-04-28 DIAGNOSIS — Z7982 Long term (current) use of aspirin: Secondary | ICD-10-CM | POA: Insufficient documentation

## 2016-04-28 DIAGNOSIS — Z87891 Personal history of nicotine dependence: Secondary | ICD-10-CM | POA: Insufficient documentation

## 2016-04-28 DIAGNOSIS — I739 Peripheral vascular disease, unspecified: Secondary | ICD-10-CM | POA: Insufficient documentation

## 2016-04-28 DIAGNOSIS — Z510 Encounter for antineoplastic radiation therapy: Secondary | ICD-10-CM | POA: Insufficient documentation

## 2016-04-28 DIAGNOSIS — I10 Essential (primary) hypertension: Secondary | ICD-10-CM | POA: Insufficient documentation

## 2016-04-28 DIAGNOSIS — H269 Unspecified cataract: Secondary | ICD-10-CM | POA: Insufficient documentation

## 2016-04-28 DIAGNOSIS — I714 Abdominal aortic aneurysm, without rupture: Secondary | ICD-10-CM | POA: Diagnosis not present

## 2016-04-28 DIAGNOSIS — Z9181 History of falling: Secondary | ICD-10-CM | POA: Diagnosis not present

## 2016-04-28 DIAGNOSIS — I2510 Atherosclerotic heart disease of native coronary artery without angina pectoris: Secondary | ICD-10-CM | POA: Diagnosis not present

## 2016-04-28 DIAGNOSIS — D381 Neoplasm of uncertain behavior of trachea, bronchus and lung: Secondary | ICD-10-CM

## 2016-04-28 DIAGNOSIS — K219 Gastro-esophageal reflux disease without esophagitis: Secondary | ICD-10-CM | POA: Diagnosis not present

## 2016-04-28 HISTORY — DX: Malignant neoplasm of unspecified part of unspecified bronchus or lung: C34.90

## 2016-04-28 NOTE — Progress Notes (Signed)
Radiation Oncology         [phone] ________________________________  Initial Outpatient Consultation  Name: Carol [name] MRN: [id number]  Date: 04/28/2016  DOB: 27-Dec-1931  MV:HQION Kuneff, DO  [name], MD   REFERRING PHYSICIAN: [name], MD  DIAGNOSIS: The encounter diagnosis was Non-small cell cancer of left lung ([place]).   82 y.o. woman with clinical stage IIIA (T2b, N2, M0) non-small cell lung cancer  HISTORY OF PRESENT ILLNESS::Carol [name] is a 82 y.o. female who presented to the ED on 01/29/16 for abdominal pain, oliguria, hematuria, nausea, weakness, and an outside CT scan demonstrating an abdominal aortic aneurysm. The patient was evaluated for dehydration secondary to esophageal stricture and she was admitted to the hospital with acute renal failure and received hydration during her hospitalization. She has a history of an esophageal stricture and has had this dilated on multiple occasions. She recently had this dilated due to weight loss and dysphagia. During her evaluation, a chest x-ray was performed and it showed a 5.3 x 5.9 cm masslike opacity in the superior segment of the left lower lobe concerning for primary bronchogenic malignancy. CT of the chest with contrast on 02/16/16 confirmed a 6.5 cm mass in the superior segment of the left lower lobe. PET scan on 03/15/16 revealed the large mass in the left lower lobe measures 6.5 cm and has an SUV of 62.9, a hypermetabolic posterior mediastinal lymph node measuring 9 mm and has an SUV of 5.87, uptake in the left hilar region with an SUV of 4.47, uptake in a right hilar node with an SUV of 3.6, and a sub-carinal lymph node with an SUV of 3.4. On 03/28/2016 the patient underwent bronchoscopy with endobronchial ultrasound and needle aspiration under the care of Dr. Ashok Cordia. The final cytology from fine needle aspiration of 11L and 7 lymph nodes showed no malignant cells. MRI of the brain on 04/22/16 showed 5 mm  enhancing mass in the hypothalamic region and inflammatory processes usually predominate in this region, but solitary metastases is distended primary concern. On 04/08/16, the patient had a biopsy of the left lower lobe mass was positive for squamous cell carcinoma. The patient presented to Dr. Julien Nordmann on 04/25/16 who requested PD-L1 expression test. He recommended a course of concurrent chemoradiation with weekly carboplatin for AUC of 2 and paclitaxel 45 MG/M2. The patient presents to me to discuss the role of radiation in the management of her disease.  PREVIOUS RADIATION THERAPY: No  PAST MEDICAL HISTORY:  has a past medical history of COPD (chronic obstructive pulmonary disease) ([place]); Hypertension; GERD (gastroesophageal reflux disease); Cataract; Fall; History of esophageal stricture; Hiatal hernia; Osteoporosis; Abdominal aortic aneurysm (AAA) ([place]); Peripheral arterial disease ([place]); Coronary artery disease; and Lung cancer ([place]).    PAST SURGICAL HISTORY: Past Surgical History  Procedure Laterality Date  . Eye surgery    . Colonoscopy    . Esophagogastroduodenoscopy (egd) with esophageal dilation    . Cataract extraction    . Esopheal dilation  02-26-16  . Endobronchial ultrasound Bilateral 03/28/2016    Procedure: ENDOBRONCHIAL ULTRASOUND;  Surgeon: [name], MD;  Location: [facility];  Service: Cardiopulmonary;  Laterality: Bilateral;  dr. Ashok Cordia insists on mac not general anesthesia    FAMILY HISTORY: family history includes Alcohol abuse in her father; Breast cancer (age of onset: 46) in her mother; Early death in her brother; Emphysema in her father; Hypertension in her paternal grandmother. There is no history of Rheumatologic disease.  SOCIAL HISTORY:  reports that she quit smoking about 2 months ago. Her smoking use included Cigarettes. She has a 90 pack-year smoking history. She has never used smokeless tobacco. She reports that she drinks alcohol. She reports that she  does not use illicit drugs.  ALLERGIES: Review of patient's allergies indicates no known allergies.  MEDICATIONS:  Current Outpatient Prescriptions  Medication Sig Dispense Refill  . albuterol (PROVENTIL HFA) 108 (90 Base) MCG/ACT inhaler Inhale 2 puffs into the lungs 2 (two) times daily as needed for shortness of breath. 8 g 5  . amLODipine (NORVASC) 5 MG tablet Take 1 tablet (5 mg total) by mouth daily. 30 tablet 5  . aspirin EC 81 MG tablet Take 81 mg by mouth daily.    Marland Kitchen atorvastatin (LIPITOR) 10 MG tablet Take 1 tablet (10 mg total) by mouth daily at 6 PM. Reported on 01/29/2016 30 tablet 11  . LORazepam (ATIVAN) 0.5 MG tablet Take 1 tablet 15mns prior to MRI. Can take an additional tablet right before test if needed. 2 tablet 0  . omeprazole (PRILOSEC) 40 MG capsule Take 1 capsule (40 mg total) by mouth daily. 90 capsule 3  . prochlorperazine (COMPAZINE) 10 MG tablet Take 1 tablet (10 mg total) by mouth every 6 (six) hours as needed for nausea or vomiting. 30 tablet 0  . Sennosides (EX-LAX) 15 MG TABS Take 15 mg by mouth daily as needed (For constipation.).     .Marland KitchenSYMBICORT 80-4.5 MCG/ACT inhaler Inhale 2 puffs into the lungs 2 (two) times daily as needed (FOR SOB). (Patient taking differently: Inhale 2 puffs into the lungs 2 times daily at 12 noon and 4 pm. ) 1 Inhaler 11   No current facility-administered medications for this encounter.    REVIEW OF SYSTEMS:  A 15 point review of systems is documented in the electronic medical record. This was obtained by the nursing staff. However, I reviewed this with the patient to discuss relevant findings and make appropriate changes.  Pertinent items are noted in HPI.   Shortness of breath on exertion.   PHYSICAL EXAM:  weight is 121 lb 1.6 oz (54.931 kg). Her blood pressure is 184/65 and her pulse is 94. Her respiration is 16 and oxygen saturation is 94%.   per med onc  RTGY:BWLSL healthy, no distress, well nourished and well developed SKIN:  skin color, texture, turgor are normal, no rashes or significant lesions HEAD: Normocephalic, No masses, lesions, tenderness or abnormalities EYES: normal, PERRLA, Conjunctiva are pink and non-injected EARS: External ears normal, Canals clear OROPHARYNX:no exudate, no erythema and lips, buccal mucosa, and tongue normal  NECK: supple, no adenopathy, no JVD LYMPH:  no palpable lymphadenopathy, no hepatosplenomegaly BREAST:not examined LUNGS: clear to auscultation , and palpation HEART: regular rate & rhythm, no murmurs and no gallops ABDOMEN:abdomen soft, non-tender, normal bowel sounds and no masses or organomegaly BACK: Back symmetric, no curvature., No CVA tenderness EXTREMITIES:no joint deformities, effusion, or inflammation, no edema, no skin discoloration  NEURO: alert & oriented x 3 with fluent speech, no focal motor/sensory deficits     ECOG = 1  0 - Asymptomatic (Fully active, able to carry on all predisease activities without restriction)  1 - Symptomatic but completely ambulatory (Restricted in physically strenuous activity but ambulatory and able to carry out work of a light or sedentary nature. For example, light housework, office work)  2 - Symptomatic, <50% in bed during the day (Ambulatory and capable of all self care but unable to carry out any  work activities. Up and about more than 50% of waking hours)  3 - Symptomatic, >50% in bed, but not bedbound (Capable of only limited self-care, confined to bed or chair 50% or more of waking hours)  4 - Bedbound (Completely disabled. Cannot carry on any self-care. Totally confined to bed or chair)  5 - Death   Eustace Pen MM, Creech RH, Tormey DC, et al. [phone]). "Toxicity and response criteria of the [facility]". [place] Oncol. 5 (6): 649-55  LABORATORY DATA:  Lab Results  Component Value Date   WBC 7.6 04/25/2016   HGB 13.7 04/25/2016   HCT 41.7 04/25/2016   MCV 88.8 04/25/2016   PLT 317 04/25/2016    NEUTROABS 3.4 04/25/2016   Lab Results  Component Value Date   NA 137 04/25/2016   K 3.9 04/25/2016   CL 99 02/10/2016   CO2 25 04/25/2016   GLUCOSE 104 04/25/2016   CREATININE 0.8 04/25/2016   CALCIUM 9.4 04/25/2016      RADIOGRAPHY: Mr Jeri Cos Wo Contrast  04/22/2016  CLINICAL DATA:  Non-small cell cancer of the left lung. Evaluate for metastatic disease. EXAM: MRI HEAD WITHOUT AND WITH CONTRAST TECHNIQUE: Multiplanar, multiecho pulse sequences of the brain and surrounding structures were obtained without and with intravenous contrast. CONTRAST:  65m MULTIHANCE GADOBENATE DIMEGLUMINE 529 MG/ML IV SOLN COMPARISON:  None. FINDINGS: Calvarium and upper cervical spine: No focal marrow signal abnormality. Orbits: Negative. Sinuses and Mastoids: Fluid level and frothy secretions in the right maxillary sinus. Brain: 5 mm focus of nodular enhancement along the hypothalamus, eccentric to the right. No other abnormal focus of enhancement intracranially. Age congruent small-vessel ischemic change in the cerebral white matter. Tiny cystic spaces in the left pons with susceptibility, presumably remote infarcts. No generalized hemorrhage. No acute infarct, hemorrhage, hydrocephalus, or major vessel occlusion. IMPRESSION: 5 mm enhancing mass in the hypothalamic region. Inflammatory processes usually predominate in this region and would correlate for any endocrinopathy. Given history and demographics, solitary metastasis is the primary concern and close imaging follow-up is recommended. Electronically Signed   By: JMonte FantasiaM.D.   On: 04/22/2016 14:50   Ct Biopsy  04/08/2016  CLINICAL DATA:  Left lower lobe lung mass and need for percutaneous biopsy. Bronchoscopy did not yield malignant cells. EXAM: CT GUIDED CORE BIOPSY OF LEFT LOWER LOBE LUNG MASS ANESTHESIA/SEDATION: 2.0  Mg IV Versed; 75 mcg IV Fentanyl Total Moderate Sedation Time: 25 minutes. The patient's level of consciousness and physiologic  status were continuously monitored during the procedure by Radiology nursing. PROCEDURE: The procedure risks, benefits, and alternatives were explained to the patient. Questions regarding the procedure were encouraged and answered. The patient understands and consents to the procedure. The posterior left chest wall was prepped with chlorhexidine in a sterile fashion, and a sterile drape was applied covering the operative field. A sterile gown and sterile gloves were used for the procedure. Local anesthesia was provided with 1% Lidocaine. CT was performed in a prone position. After localizing a left lower lobe lung mass, a 17 gauge needle was advanced into the mass under CT guidance. Two separate 18 gauge core biopsy samples were obtained and submitted in formalin. The Biosentry device was used in placing a plug at the pleural entry site. Additional CT imaging was performed. COMPLICATIONS: None FINDINGS: Large mass within the superior segment of the left lower lobe measures approximately 5.7 x 7.2 cm. Solid tissue was obtained. There were no immediate complications. IMPRESSION: CT-guided core  biopsy performed of the large left lower lobe lung mass. Electronically Signed   By: Aletta Edouard M.D.   On: 04/08/2016 14:57      IMPRESSION: 82 y.o. woman with clinical stage IIIA (T2b, N2, M0) non-small cell lung cancer. The patient is a good candidate for chemoradiation, but due to her advanced age surgery is not an option. Depending on the results of the PD-L1 expression test, immunotherapy could be an additional option.  PLAN: We discussed her diagnosis and stage. We discussed definitive chemoradiation in the management of her disease. We discussed 6 weeks of treatment as an outpatient. We discussed the process of CT simulation and the placement of tattoos. We discussed dysphagia, weight loss, and fatigue as the acute side effects of radiation. We discussed damage to critical normal structures in the chest as well  as the spinal cord as a possibility, but improbably. We will schedule her for sim and begin treatments as soon as possible. I encouraged her to start eating and gaining weight. The tentative date for beginning chemotherapy is 05/09/16.  I spent 60 minutes minutes face to face with the patient and more than 50% of that time was spent in counseling and/or coordination of care.   ------------------------------------------------  Carola Rhine, PAC  Please see the note from Shona Simpson, PA-C from today's visit for more details of today's encounter.  I have personally performed a face to face diagnostic evaluation on this patient and devised the assessment and plan.  ------------------------------------------------   Tyler Pita, MD [place] Director and Director of Stereotactic Radiosurgery Direct Dial: [phone]  Fax: [phone] [email or web address]  Skype  [email or web address]     This document serves as a record of services personally performed by Shona Simpson, PA-C and Tyler Pita, MD. It was created on their behalf by Darcus Austin, a trained medical scribe. The creation of this record is based on the scribe's personal observations and the providers' statements to them. This document has been checked and approved by the attending provider.

## 2016-04-28 NOTE — Progress Notes (Signed)
See progress note under physician encounter. 

## 2016-04-29 ENCOUNTER — Ambulatory Visit
Admission: RE | Admit: 2016-04-29 | Discharge: 2016-04-29 | Disposition: A | Discharge status: Home / Self Care | Payer: Medicare Other | Source: Ambulatory Visit | Attending: Radiation Oncology | Admitting: Radiation Oncology

## 2016-04-29 DIAGNOSIS — C3492 Malignant neoplasm of unspecified part of left bronchus or lung: Secondary | ICD-10-CM

## 2016-04-29 DIAGNOSIS — Z510 Encounter for antineoplastic radiation therapy: Secondary | ICD-10-CM | POA: Diagnosis not present

## 2016-04-29 NOTE — Addendum Note (Signed)
Encounter addended by: Tyler Pita, MD on: 04/29/2016 10:53 AM<BR>     Documentation filed: Problem List

## 2016-04-29 NOTE — Progress Notes (Signed)
  Radiation Oncology         (336) [phone] ________________________________  Name: Carol [name] MRN: [id number]  Date: 04/29/2016  DOB: [birth year]  SIMULATION AND TREATMENT PLANNING NOTE    ICD-9-CM ICD-10-CM   1. Non-small cell cancer of left lung ([facility]) 162.9 C34.92     DIAGNOSIS:  82 yo woman with clinical stage IIIA (T2b, N2, M0) non-small cell lung cancer.  NARRATIVE:  The patient was brought to the [facility].  Identity was confirmed.  All relevant records and images related to the planned course of therapy were reviewed.  The patient freely provided informed written consent to proceed with treatment after reviewing the details related to the planned course of therapy. The consent form was witnessed and verified by the simulation staff.  Then, the patient was set-up in a stable reproducible  supine position for radiation therapy.  CT images were obtained.  Surface markings were placed.  The CT images were loaded into the planning software.  Then the target and avoidance structures were contoured.  Treatment planning then occurred.  The radiation prescription was entered and confirmed.  Then, I designed and supervised the construction of a total of 6 medically necessary complex treatment devices, including a BodyFix immobilization mold custom fitted to the patient along with 5 multileaf collimators conformally shaped radiation around the treatment target while shielding critical structures such as the heart and spinal cord maximally.  I have requested : 3D Simulation  I have requested a DVH of the following structures: Left lung, right lung, spinal cord, heart, esophagus, and target.  I have ordered:Nutrition Consult  SPECIAL TREATMENT PROCEDURE:  The planned course of therapy using radiation constitutes a special treatment procedure. Special care is required in the management of this patient for the following reasons.  The patient will be receiving concurrent chemotherapy  requiring careful monitoring for increased toxicities of treatment including periodic laboratory values.  The special nature of the planned course of radiotherapy will require increased physician supervision and oversight to ensure patient's safety with optimal treatment outcomes.  PLAN:  The patient will receive 66 Gy in 33 fractions.  ________________________________  Sheral Apley Tammi Klippel, M.D.    This document serves as a record of services personally performed by Tyler Pita, MD. It was created on his behalf by Lendon Collar, a trained medical scribe. The creation of this record is based on the scribe's personal observations and the provider's statements to them. This document has been checked and approved by the attending provider.

## 2016-05-03 NOTE — Addendum Note (Signed)
Encounter addended by: Heywood Footman, RN on: 05/03/2016 10:11 AM<BR>     Documentation filed: Charges VN

## 2016-05-04 ENCOUNTER — Encounter (HOSPITAL_COMMUNITY): Payer: Self-pay

## 2016-05-05 ENCOUNTER — Encounter: Payer: Self-pay | Admitting: *Deleted

## 2016-05-05 ENCOUNTER — Telehealth: Payer: Self-pay | Admitting: *Deleted

## 2016-05-05 ENCOUNTER — Other Ambulatory Visit: Payer: Medicare Other

## 2016-05-05 NOTE — Telephone Encounter (Signed)
Oncology Nurse Navigator Documentation  Oncology Nurse Navigator Flowsheets 05/05/2016  Navigator Encounter Type Telephone  Telephone Outgoing Call  Treatment Phase Treatment  Barriers/Navigation Needs Education  Education Other  Interventions Education Method  Education Method Verbal  Acuity Level 1  Time Spent with Patient 15   Mr. Carol [name] called me with questions regarding schedule.  I clarified.  He was thankful for the help.

## 2016-05-05 NOTE — Telephone Encounter (Signed)
Oncology Nurse Navigator Documentation  Oncology Nurse Navigator Flowsheets 05/05/2016  Navigator Encounter Type Telephone  Telephone Outgoing Call  Treatment Phase Treatment  Barriers/Navigation Needs Education  Education Other  Interventions Education Method  Education Method Verbal  Acuity Level 1  Time Spent with Patient 15   Mr. Carol [name] called to clarify schedule.  I called him back and left a vm message to call when he could.

## 2016-05-06 ENCOUNTER — Other Ambulatory Visit: Payer: Self-pay | Admitting: Medical Oncology

## 2016-05-06 DIAGNOSIS — C3492 Malignant neoplasm of unspecified part of left bronchus or lung: Secondary | ICD-10-CM

## 2016-05-09 ENCOUNTER — Ambulatory Visit: Payer: Medicare Other | Admitting: Radiation Oncology

## 2016-05-09 ENCOUNTER — Other Ambulatory Visit (HOSPITAL_BASED_OUTPATIENT_CLINIC_OR_DEPARTMENT_OTHER): Payer: Medicare Other

## 2016-05-09 ENCOUNTER — Ambulatory Visit (HOSPITAL_BASED_OUTPATIENT_CLINIC_OR_DEPARTMENT_OTHER): Payer: Medicare Other

## 2016-05-09 VITALS — BP 135/56 | HR 69 | Temp 98.5°F | Resp 16

## 2016-05-09 DIAGNOSIS — Z5111 Encounter for antineoplastic chemotherapy: Secondary | ICD-10-CM

## 2016-05-09 DIAGNOSIS — C3432 Malignant neoplasm of lower lobe, left bronchus or lung: Secondary | ICD-10-CM

## 2016-05-09 DIAGNOSIS — Z510 Encounter for antineoplastic radiation therapy: Secondary | ICD-10-CM | POA: Diagnosis not present

## 2016-05-09 DIAGNOSIS — C3492 Malignant neoplasm of unspecified part of left bronchus or lung: Secondary | ICD-10-CM

## 2016-05-09 LAB — CBC WITH DIFFERENTIAL/PLATELET
BASO%: 1.2 % (ref 0.0–2.0)
BASOS ABS: 0.1 10*3/uL (ref 0.0–0.1)
EOS ABS: 0.3 10*3/uL (ref 0.0–0.5)
EOS%: 4 % (ref 0.0–7.0)
HEMATOCRIT: 38.4 % (ref 34.8–46.6)
HEMOGLOBIN: 12.9 g/dL (ref 11.6–15.9)
LYMPH#: 2.3 10*3/uL (ref 0.9–3.3)
LYMPH%: 31.5 % (ref 14.0–49.7)
MCH: 30 pg (ref 25.1–34.0)
MCHC: 33.5 g/dL (ref 31.5–36.0)
MCV: 89.6 fL (ref 79.5–101.0)
MONO#: 0.9 10*3/uL (ref 0.1–0.9)
MONO%: 13 % (ref 0.0–14.0)
NEUT%: 50.3 % (ref 38.4–76.8)
NEUTROS ABS: 3.6 10*3/uL (ref 1.5–6.5)
PLATELETS: 302 10*3/uL (ref 145–400)
RBC: 4.28 10*6/uL (ref 3.70–5.45)
RDW: 16.2 % — AB (ref 11.2–14.5)
WBC: 7.2 10*3/uL (ref 3.9–10.3)

## 2016-05-09 LAB — COMPREHENSIVE METABOLIC PANEL
ALBUMIN: 3.2 g/dL — AB (ref 3.5–5.0)
ALK PHOS: 108 U/L (ref 40–150)
ALT: 12 U/L (ref 0–55)
ANION GAP: 11 meq/L (ref 3–11)
AST: 16 U/L (ref 5–34)
BILIRUBIN TOTAL: 0.35 mg/dL (ref 0.20–1.20)
BUN: 11 mg/dL (ref 7.0–26.0)
CALCIUM: 8.7 mg/dL (ref 8.4–10.4)
CO2: 22 mEq/L (ref 22–29)
Chloride: 107 mEq/L (ref 98–109)
Creatinine: 0.7 mg/dL (ref 0.6–1.1)
EGFR: 76 mL/min/{1.73_m2} — AB (ref >90)
GLUCOSE: 126 mg/dL (ref 70–140)
POTASSIUM: 3.3 meq/L — AB (ref 3.5–5.1)
SODIUM: 139 meq/L (ref 136–145)
TOTAL PROTEIN: 7.2 g/dL (ref 6.4–8.3)

## 2016-05-09 MED ORDER — DIPHENHYDRAMINE HCL 50 MG/ML IJ SOLN
INTRAMUSCULAR | Status: AC
Start: 2016-05-09 — End: 2016-05-09
  Filled 2016-05-09: qty 1

## 2016-05-09 MED ORDER — PALONOSETRON HCL INJECTION 0.25 MG/5ML
0.2500 mg | Freq: Once | INTRAVENOUS | Status: AC
  Administered 2016-05-09: 0.25 mg via INTRAVENOUS

## 2016-05-09 MED ORDER — FAMOTIDINE IN NACL 20-0.9 MG/50ML-% IV SOLN
INTRAVENOUS | Status: AC
  Filled 2016-05-09: qty 50

## 2016-05-09 MED ORDER — SODIUM CHLORIDE 0.9 % IV SOLN
20.0000 mg | Freq: Once | INTRAVENOUS | Status: AC
  Administered 2016-05-09: 20 mg via INTRAVENOUS
  Filled 2016-05-09: qty 2

## 2016-05-09 MED ORDER — SODIUM CHLORIDE 0.9 % IV SOLN
125.6000 mg | Freq: Once | INTRAVENOUS | Status: AC
  Administered 2016-05-09: 130 mg via INTRAVENOUS
  Filled 2016-05-09: qty 13

## 2016-05-09 MED ORDER — CLONIDINE HCL 0.1 MG PO TABS
0.2000 mg | ORAL_TABLET | Freq: Once | ORAL | Status: AC
  Administered 2016-05-09: 0.2 mg via ORAL

## 2016-05-09 MED ORDER — CLONIDINE HCL 0.1 MG PO TABS
ORAL_TABLET | ORAL | Status: AC
  Filled 2016-05-09: qty 2

## 2016-05-09 MED ORDER — PALONOSETRON HCL INJECTION 0.25 MG/5ML
INTRAVENOUS | Status: AC
  Filled 2016-05-09: qty 5

## 2016-05-09 MED ORDER — HEPARIN SOD (PORK) LOCK FLUSH 100 UNIT/ML IV SOLN
500.0000 [IU] | Freq: Once | INTRAVENOUS | Status: DC | PRN
  Filled 2016-05-09: qty 5

## 2016-05-09 MED ORDER — FAMOTIDINE IN NACL 20-0.9 MG/50ML-% IV SOLN
20.0000 mg | Freq: Once | INTRAVENOUS | Status: AC
  Administered 2016-05-09: 20 mg via INTRAVENOUS

## 2016-05-09 MED ORDER — SODIUM CHLORIDE 0.9 % IV SOLN
Freq: Once | INTRAVENOUS | Status: AC
  Administered 2016-05-09: 10:00:00 via INTRAVENOUS

## 2016-05-09 MED ORDER — PACLITAXEL CHEMO INJECTION 300 MG/50ML
45.0000 mg/m2 | Freq: Once | INTRAVENOUS | Status: AC
  Administered 2016-05-09: 72 mg via INTRAVENOUS
  Filled 2016-05-09: qty 12

## 2016-05-09 MED ORDER — DIPHENHYDRAMINE HCL 50 MG/ML IJ SOLN
50.0000 mg | Freq: Once | INTRAMUSCULAR | Status: AC
  Administered 2016-05-09: 50 mg via INTRAVENOUS

## 2016-05-09 MED ORDER — SODIUM CHLORIDE 0.9% FLUSH
10.0000 mL | INTRAVENOUS | Status: DC | PRN
  Filled 2016-05-09: qty 10

## 2016-05-09 NOTE — Patient Instructions (Signed)
[facility] Discharge Instructions for Patients Receiving Chemotherapy  Today you received the following chemotherapy agents:  Taxol and Carboplatin.  To help prevent nausea and vomiting after your treatment, we encourage you to take your nausea medication as prescribed.   If you develop nausea and vomiting that is not controlled by your nausea medication, call the clinic.   BELOW ARE SYMPTOMS THAT SHOULD BE REPORTED IMMEDIATELY:  *FEVER GREATER THAN 100.5 F  *CHILLS WITH OR WITHOUT FEVER  NAUSEA AND VOMITING THAT IS NOT CONTROLLED WITH YOUR NAUSEA MEDICATION  *UNUSUAL SHORTNESS OF BREATH  *UNUSUAL BRUISING OR BLEEDING  TENDERNESS IN MOUTH AND THROAT WITH OR WITHOUT PRESENCE OF ULCERS  *URINARY PROBLEMS  *BOWEL PROBLEMS  UNUSUAL RASH Items with * indicate a potential emergency and should be followed up as soon as possible.  Feel free to call the clinic you have any questions or concerns. The clinic phone number is [phone].  Please show the [facility] at check-in to the Emergency Department and triage nurse.  Paclitaxel injection What is this medicine? PACLITAXEL (PAK li TAX el) is a chemotherapy drug. It targets fast dividing cells, like cancer cells, and causes these cells to die. This medicine is used to treat ovarian cancer, breast cancer, and other cancers. This medicine may be used for other purposes; ask your health care provider or pharmacist if you have questions. What should I tell my health care provider before I take this medicine? They need to know if you have any of these conditions: -blood disorders -irregular heartbeat -infection (especially a virus infection such as chickenpox, cold sores, or herpes) -liver disease -previous or ongoing radiation therapy -an unusual or allergic reaction to paclitaxel, alcohol, polyoxyethylated castor oil, other chemotherapy agents, other medicines, foods, dyes, or preservatives -pregnant or  trying to get pregnant -breast-feeding How should I use this medicine? This drug is given as an infusion into a vein. It is administered in a hospital or clinic by a specially trained health care professional. Talk to your pediatrician regarding the use of this medicine in children. Special care may be needed. Overdosage: If you think you have taken too much of this medicine contact a poison control center or emergency room at once. NOTE: This medicine is only for you. Do not share this medicine with others. What if I miss a dose? It is important not to miss your dose. Call your doctor or health care professional if you are unable to keep an appointment. What may interact with this medicine? Do not take this medicine with any of the following medications: -disulfiram -metronidazole This medicine may also interact with the following medications: -cyclosporine -diazepam -ketoconazole -medicines to increase blood counts like filgrastim, pegfilgrastim, sargramostim -other chemotherapy drugs like cisplatin, doxorubicin, epirubicin, etoposide, teniposide, vincristine -quinidine -testosterone -vaccines -verapamil Talk to your doctor or health care professional before taking any of these medicines: -acetaminophen -aspirin -ibuprofen -ketoprofen -naproxen This list may not describe all possible interactions. Give your health care provider a list of all the medicines, herbs, non-prescription drugs, or dietary supplements you use. Also tell them if you smoke, drink alcohol, or use illegal drugs. Some items may interact with your medicine. What should I watch for while using this medicine? Your condition will be monitored carefully while you are receiving this medicine. You will need important blood work done while you are taking this medicine. This drug may make you feel generally unwell. This is not uncommon, as chemotherapy can affect healthy cells as  well as cancer cells. Report any side  effects. Continue your course of treatment even though you feel ill unless your doctor tells you to stop. This medicine can cause serious allergic reactions. To reduce your risk you will need to take other medicine(s) before treatment with this medicine. In some cases, you may be given additional medicines to help with side effects. Follow all directions for their use. Call your doctor or health care professional for advice if you get a fever, chills or sore throat, or other symptoms of a cold or flu. Do not treat yourself. This drug decreases your body's ability to fight infections. Try to avoid being around people who are sick. This medicine may increase your risk to bruise or bleed. Call your doctor or health care professional if you notice any unusual bleeding. Be careful brushing and flossing your teeth or using a toothpick because you may get an infection or bleed more easily. If you have any dental work done, tell your dentist you are receiving this medicine. Avoid taking products that contain aspirin, acetaminophen, ibuprofen, naproxen, or ketoprofen unless instructed by your doctor. These medicines may hide a fever. Do not become pregnant while taking this medicine. Women should inform their doctor if they wish to become pregnant or think they might be pregnant. There is a potential for serious side effects to an unborn child. Talk to your health care professional or pharmacist for more information. Do not breast-feed an infant while taking this medicine. Men are advised not to father a child while receiving this medicine. This product may contain alcohol. Ask your pharmacist or healthcare provider if this medicine contains alcohol. Be sure to tell all healthcare providers you are taking this medicine. Certain medicines, like metronidazole and disulfiram, can cause an unpleasant reaction when taken with alcohol. The reaction includes flushing, headache, nausea, vomiting, sweating, and increased  thirst. The reaction can last from 30 minutes to several hours. What side effects may I notice from receiving this medicine? Side effects that you should report to your doctor or health care professional as soon as possible: -allergic reactions like skin rash, itching or hives, swelling of the face, lips, or tongue -low blood counts - This drug may decrease the number of white blood cells, red blood cells and platelets. You may be at increased risk for infections and bleeding. -signs of infection - fever or chills, cough, sore throat, pain or difficulty passing urine -signs of decreased platelets or bleeding - bruising, pinpoint red spots on the skin, black, tarry stools, nosebleeds -signs of decreased red blood cells - unusually weak or tired, fainting spells, lightheadedness -breathing problems -chest pain -high or low blood pressure -mouth sores -nausea and vomiting -pain, swelling, redness or irritation at the injection site -pain, tingling, numbness in the hands or feet -slow or irregular heartbeat -swelling of the ankle, feet, hands Side effects that usually do not require medical attention (report to your doctor or health care professional if they continue or are bothersome): -bone pain -complete hair loss including hair on your head, underarms, pubic hair, eyebrows, and eyelashes -changes in the color of fingernails -diarrhea -loosening of the fingernails -loss of appetite -muscle or joint pain -red flush to skin -sweating This list may not describe all possible side effects. Call your doctor for medical advice about side effects. You may report side effects to FDA at 1-800-FDA-1088. Where should I keep my medicine? This drug is given in a hospital or clinic and will not be stored  at home. NOTE: This sheet is a summary. It may not cover all possible information. If you have questions about this medicine, talk to your doctor, pharmacist, or health care provider.    2016,  [employer] Standard. (2015-07-09 13:02:56)  Carboplatin injection What is this medicine? CARBOPLATIN (KAR boe pla tin) is a chemotherapy drug. It targets fast dividing cells, like cancer cells, and causes these cells to die. This medicine is used to treat ovarian cancer and many other cancers. This medicine may be used for other purposes; ask your health care provider or pharmacist if you have questions. What should I tell my health care provider before I take this medicine? They need to know if you have any of these conditions: -blood disorders -hearing problems -kidney disease -recent or ongoing radiation therapy -an unusual or allergic reaction to carboplatin, cisplatin, other chemotherapy, other medicines, foods, dyes, or preservatives -pregnant or trying to get pregnant -breast-feeding How should I use this medicine? This drug is usually given as an infusion into a vein. It is administered in a hospital or clinic by a specially trained health care professional. Talk to your pediatrician regarding the use of this medicine in children. Special care may be needed. Overdosage: If you think you have taken too much of this medicine contact a poison control center or emergency room at once. NOTE: This medicine is only for you. Do not share this medicine with others. What if I miss a dose? It is important not to miss a dose. Call your doctor or health care professional if you are unable to keep an appointment. What may interact with this medicine? -medicines for seizures -medicines to increase blood counts like filgrastim, pegfilgrastim, sargramostim -some antibiotics like amikacin, gentamicin, neomycin, streptomycin, tobramycin -vaccines Talk to your doctor or health care professional before taking any of these medicines: -acetaminophen -aspirin -ibuprofen -ketoprofen -naproxen This list may not describe all possible interactions. Give your health care provider a list of all the  medicines, herbs, non-prescription drugs, or dietary supplements you use. Also tell them if you smoke, drink alcohol, or use illegal drugs. Some items may interact with your medicine. What should I watch for while using this medicine? Your condition will be monitored carefully while you are receiving this medicine. You will need important blood work done while you are taking this medicine. This drug may make you feel generally unwell. This is not uncommon, as chemotherapy can affect healthy cells as well as cancer cells. Report any side effects. Continue your course of treatment even though you feel ill unless your doctor tells you to stop. In some cases, you may be given additional medicines to help with side effects. Follow all directions for their use. Call your doctor or health care professional for advice if you get a fever, chills or sore throat, or other symptoms of a cold or flu. Do not treat yourself. This drug decreases your body's ability to fight infections. Try to avoid being around people who are sick. This medicine may increase your risk to bruise or bleed. Call your doctor or health care professional if you notice any unusual bleeding. Be careful brushing and flossing your teeth or using a toothpick because you may get an infection or bleed more easily. If you have any dental work done, tell your dentist you are receiving this medicine. Avoid taking products that contain aspirin, acetaminophen, ibuprofen, naproxen, or ketoprofen unless instructed by your doctor. These medicines may hide a fever. Do not become pregnant while taking this  medicine. Women should inform their doctor if they wish to become pregnant or think they might be pregnant. There is a potential for serious side effects to an unborn child. Talk to your health care professional or pharmacist for more information. Do not breast-feed an infant while taking this medicine. What side effects may I notice from receiving this  medicine? Side effects that you should report to your doctor or health care professional as soon as possible: -allergic reactions like skin rash, itching or hives, swelling of the face, lips, or tongue -signs of infection - fever or chills, cough, sore throat, pain or difficulty passing urine -signs of decreased platelets or bleeding - bruising, pinpoint red spots on the skin, black, tarry stools, nosebleeds -signs of decreased red blood cells - unusually weak or tired, fainting spells, lightheadedness -breathing problems -changes in hearing -changes in vision -chest pain -high blood pressure -low blood counts - This drug may decrease the number of white blood cells, red blood cells and platelets. You may be at increased risk for infections and bleeding. -nausea and vomiting -pain, swelling, redness or irritation at the injection site -pain, tingling, numbness in the hands or feet -problems with balance, talking, walking -trouble passing urine or change in the amount of urine Side effects that usually do not require medical attention (report to your doctor or health care professional if they continue or are bothersome): -hair loss -loss of appetite -metallic taste in the mouth or changes in taste This list may not describe all possible side effects. Call your doctor for medical advice about side effects. You may report side effects to FDA at 1-800-FDA-1088. Where should I keep my medicine? This drug is given in a hospital or clinic and will not be stored at home. NOTE: This sheet is a summary. It may not cover all possible information. If you have questions about this medicine, talk to your doctor, pharmacist, or health care provider.    2016, [employer]/Gold Standard. (2008-02-26 14:38:05)

## 2016-05-09 NOTE — Progress Notes (Signed)
Quick Note:  Call patient with the result and encourage K Rich diet. ______

## 2016-05-10 ENCOUNTER — Telehealth: Payer: Self-pay | Admitting: Medical Oncology

## 2016-05-10 ENCOUNTER — Ambulatory Visit
Admission: RE | Admit: 2016-05-10 | Discharge: 2016-05-10 | Disposition: A | Discharge status: Home / Self Care | Payer: Medicare Other | Source: Ambulatory Visit | Attending: Radiation Oncology | Admitting: Radiation Oncology

## 2016-05-10 ENCOUNTER — Ambulatory Visit: Payer: Medicare Other

## 2016-05-10 ENCOUNTER — Telehealth: Payer: Self-pay

## 2016-05-10 DIAGNOSIS — Z510 Encounter for antineoplastic radiation therapy: Secondary | ICD-10-CM | POA: Diagnosis not present

## 2016-05-10 NOTE — Telephone Encounter (Signed)
-----   Message from Sinda Du, RN sent at 05/09/2016 10:02 AM EDT ----- Regarding: Dr. Julien Nordmann - 1st chemo f/u First chemo f/u call

## 2016-05-10 NOTE — Telephone Encounter (Signed)
-----   Message from Curt Bears, MD sent at 05/09/2016 10:41 PM EDT ----- Call patient with the result and encourage K Rich diet.

## 2016-05-10 NOTE — Telephone Encounter (Signed)
Called Carol [name] for chemotherapy F/U.  Left message for pt to call if they have any questions or concerns or any new symptoms to report, along with the phone number.

## 2016-05-10 NOTE — Telephone Encounter (Signed)
I left amessage for pt to increase potassium rich foods in her diet.

## 2016-05-11 ENCOUNTER — Ambulatory Visit
Admission: RE | Admit: 2016-05-11 | Discharge: 2016-05-11 | Disposition: A | Discharge status: Home / Self Care | Payer: Medicare Other | Source: Ambulatory Visit | Attending: Radiation Oncology | Admitting: Radiation Oncology

## 2016-05-11 ENCOUNTER — Ambulatory Visit: Payer: Medicare Other

## 2016-05-11 DIAGNOSIS — Z510 Encounter for antineoplastic radiation therapy: Secondary | ICD-10-CM | POA: Diagnosis not present

## 2016-05-12 ENCOUNTER — Ambulatory Visit: Payer: Medicare Other

## 2016-05-12 ENCOUNTER — Ambulatory Visit
Admission: RE | Admit: 2016-05-12 | Discharge: 2016-05-12 | Disposition: A | Discharge status: Home / Self Care | Payer: Medicare Other | Source: Ambulatory Visit | Attending: Radiation Oncology | Admitting: Radiation Oncology

## 2016-05-12 DIAGNOSIS — Z510 Encounter for antineoplastic radiation therapy: Secondary | ICD-10-CM | POA: Diagnosis not present

## 2016-05-13 ENCOUNTER — Encounter: Payer: Self-pay | Admitting: Radiation Oncology

## 2016-05-13 ENCOUNTER — Ambulatory Visit
Admission: RE | Admit: 2016-05-13 | Discharge: 2016-05-13 | Disposition: A | Discharge status: Home / Self Care | Payer: Medicare Other | Source: Ambulatory Visit | Attending: Radiation Oncology | Admitting: Radiation Oncology

## 2016-05-13 ENCOUNTER — Ambulatory Visit: Payer: Medicare Other

## 2016-05-13 ENCOUNTER — Other Ambulatory Visit: Payer: Self-pay | Admitting: Medical Oncology

## 2016-05-13 VITALS — BP 182/76 | HR 83 | Resp 16 | Wt 122.7 lb

## 2016-05-13 DIAGNOSIS — C3492 Malignant neoplasm of unspecified part of left bronchus or lung: Secondary | ICD-10-CM

## 2016-05-13 DIAGNOSIS — Z510 Encounter for antineoplastic radiation therapy: Secondary | ICD-10-CM | POA: Diagnosis not present

## 2016-05-13 NOTE — Progress Notes (Addendum)
Weight and vitals stable. Denies pain. Reports a mild cough and SOB with exertion. Denies hemoptysis. Denies fatigue. Oriented patient to staff and routine of the clinic. Provided patient with RADIATION THERAPY AND YOU handbook then, reviewed pertinent information. Educated patient reference potential side effects and management such as fatigue, skin changes, cough, sob and difficulty swallowing. Provided patient with radiaplex and directed upon use. Afforded patient the opportunity to ask questions and answered those to the best of my ability. Patient verbalized understanding of all reviewed.   BP 182/76 mmHg  Pulse 83  Resp 16  Wt 122 lb 11.2 oz (55.656 kg)  SpO2 100% Wt Readings from Last 3 Encounters:  05/13/16 122 lb 11.2 oz (55.656 kg)  04/28/16 121 lb 1.6 oz (54.931 kg)  04/25/16 119 lb 9.6 oz (54.25 kg)

## 2016-05-13 NOTE — Progress Notes (Signed)
  Radiation Oncology         [phone]   Name: Carol [name] MRN: [id number]   Date: 05/13/2016  DOB: [birth year]   Weekly Radiation Therapy Management    ICD-9-CM ICD-10-CM   1. Non-small cell cancer of left lung (HCC) 162.9 C34.92     Current Dose: 8 Gy  Planned Dose:  66 Gy  Narrative The patient presents for routine under treatment assessment.   Weight and vitals stable. Denies pain. Reports a mild cough and SOB with exertion. Denies hemoptysis. Denies fatigue..  .   The patient is without complaint. Set-up films were reviewed. The chart was checked.  Physical Findings  weight is 122 lb 11.2 oz (55.656 kg). Her blood pressure is 182/76 and her pulse is 83. Her respiration is 16 and oxygen saturation is 100%. . Weight essentially stable.  No significant changes.  Impression The patient is tolerating radiation.  Plan Continue treatment as planned.      Sheral Apley Tammi Klippel, M.D.   This document serves as a record of services personally performed by Tyler Pita, MD. It was created on his behalf by Derek Mound, a trained medical scribe. The creation of this record is based on the scribe's personal observations and the provider's statements to them. This document has been checked and approved by the attending provider.

## 2016-05-16 ENCOUNTER — Telehealth: Payer: Self-pay | Admitting: *Deleted

## 2016-05-16 ENCOUNTER — Ambulatory Visit: Payer: Medicare Other | Admitting: Nutrition

## 2016-05-16 ENCOUNTER — Ambulatory Visit: Payer: Medicare Other

## 2016-05-16 ENCOUNTER — Ambulatory Visit (HOSPITAL_BASED_OUTPATIENT_CLINIC_OR_DEPARTMENT_OTHER): Payer: Medicare Other | Admitting: Internal Medicine

## 2016-05-16 ENCOUNTER — Encounter: Payer: Self-pay | Admitting: *Deleted

## 2016-05-16 ENCOUNTER — Other Ambulatory Visit (HOSPITAL_BASED_OUTPATIENT_CLINIC_OR_DEPARTMENT_OTHER): Payer: Medicare Other

## 2016-05-16 ENCOUNTER — Ambulatory Visit (HOSPITAL_BASED_OUTPATIENT_CLINIC_OR_DEPARTMENT_OTHER): Payer: Medicare Other

## 2016-05-16 ENCOUNTER — Ambulatory Visit
Admission: RE | Admit: 2016-05-16 | Discharge: 2016-05-16 | Disposition: A | Discharge status: Home / Self Care | Payer: Medicare Other | Source: Ambulatory Visit | Attending: Radiation Oncology | Admitting: Radiation Oncology

## 2016-05-16 ENCOUNTER — Encounter: Payer: Self-pay | Admitting: Internal Medicine

## 2016-05-16 VITALS — BP 142/65 | HR 90 | Temp 98.6°F | Resp 17 | Wt 122.6 lb

## 2016-05-16 DIAGNOSIS — C3432 Malignant neoplasm of lower lobe, left bronchus or lung: Secondary | ICD-10-CM

## 2016-05-16 DIAGNOSIS — C3492 Malignant neoplasm of unspecified part of left bronchus or lung: Secondary | ICD-10-CM

## 2016-05-16 DIAGNOSIS — Z510 Encounter for antineoplastic radiation therapy: Secondary | ICD-10-CM | POA: Diagnosis not present

## 2016-05-16 DIAGNOSIS — Z5111 Encounter for antineoplastic chemotherapy: Secondary | ICD-10-CM

## 2016-05-16 DIAGNOSIS — R1310 Dysphagia, unspecified: Secondary | ICD-10-CM | POA: Diagnosis not present

## 2016-05-16 HISTORY — DX: Encounter for antineoplastic chemotherapy: Z51.11

## 2016-05-16 LAB — CBC WITH DIFFERENTIAL/PLATELET
BASO%: 0.4 % (ref 0.0–2.0)
Basophils Absolute: 0 10*3/uL (ref 0.0–0.1)
EOS%: 2.5 % (ref 0.0–7.0)
Eosinophils Absolute: 0.1 10*3/uL (ref 0.0–0.5)
HEMATOCRIT: 38.9 % (ref 34.8–46.6)
HEMOGLOBIN: 12.8 g/dL (ref 11.6–15.9)
LYMPH#: 1.5 10*3/uL (ref 0.9–3.3)
LYMPH%: 26.4 % (ref 14.0–49.7)
MCH: 29.8 pg (ref 25.1–34.0)
MCHC: 32.9 g/dL (ref 31.5–36.0)
MCV: 90.7 fL (ref 79.5–101.0)
MONO#: 0.7 10*3/uL (ref 0.1–0.9)
MONO%: 11.5 % (ref 0.0–14.0)
NEUT#: 3.4 10*3/uL (ref 1.5–6.5)
NEUT%: 59.2 % (ref 38.4–76.8)
PLATELETS: 290 10*3/uL (ref 145–400)
RBC: 4.29 10*6/uL (ref 3.70–5.45)
RDW: 15.6 % — AB (ref 11.2–14.5)
WBC: 5.7 10*3/uL (ref 3.9–10.3)

## 2016-05-16 LAB — COMPREHENSIVE METABOLIC PANEL
ALBUMIN: 3 g/dL — AB (ref 3.5–5.0)
ALK PHOS: 103 U/L (ref 40–150)
ALT: 14 U/L (ref 0–55)
ANION GAP: 9 meq/L (ref 3–11)
AST: 16 U/L (ref 5–34)
BILIRUBIN TOTAL: 0.39 mg/dL (ref 0.20–1.20)
BUN: 13.2 mg/dL (ref 7.0–26.0)
CALCIUM: 8.5 mg/dL (ref 8.4–10.4)
CO2: 24 mEq/L (ref 22–29)
CREATININE: 0.7 mg/dL (ref 0.6–1.1)
Chloride: 107 mEq/L (ref 98–109)
EGFR: 77 mL/min/{1.73_m2} — ABNORMAL LOW (ref >90)
Glucose: 99 mg/dl (ref 70–140)
Potassium: 3.6 mEq/L (ref 3.5–5.1)
Sodium: 140 mEq/L (ref 136–145)
TOTAL PROTEIN: 6.7 g/dL (ref 6.4–8.3)

## 2016-05-16 MED ORDER — PACLITAXEL CHEMO INJECTION 300 MG/50ML
45.0000 mg/m2 | Freq: Once | INTRAVENOUS | Status: AC
  Administered 2016-05-16: 72 mg via INTRAVENOUS
  Filled 2016-05-16: qty 12

## 2016-05-16 MED ORDER — DEXAMETHASONE SODIUM PHOSPHATE 100 MG/10ML IJ SOLN
20.0000 mg | Freq: Once | INTRAMUSCULAR | Status: AC
  Administered 2016-05-16: 20 mg via INTRAVENOUS
  Filled 2016-05-16: qty 2

## 2016-05-16 MED ORDER — SODIUM CHLORIDE 0.9 % IV SOLN
130.0000 mg | Freq: Once | INTRAVENOUS | Status: AC
  Administered 2016-05-16: 130 mg via INTRAVENOUS
  Filled 2016-05-16: qty 13

## 2016-05-16 MED ORDER — FAMOTIDINE IN NACL 20-0.9 MG/50ML-% IV SOLN
20.0000 mg | Freq: Once | INTRAVENOUS | Status: AC
  Administered 2016-05-16: 20 mg via INTRAVENOUS

## 2016-05-16 MED ORDER — PALONOSETRON HCL INJECTION 0.25 MG/5ML
INTRAVENOUS | Status: AC
  Filled 2016-05-16: qty 5

## 2016-05-16 MED ORDER — DIPHENHYDRAMINE HCL 50 MG/ML IJ SOLN
INTRAMUSCULAR | Status: AC
  Filled 2016-05-16: qty 1

## 2016-05-16 MED ORDER — DIPHENHYDRAMINE HCL 50 MG/ML IJ SOLN
50.0000 mg | Freq: Once | INTRAMUSCULAR | Status: AC
  Administered 2016-05-16: 50 mg via INTRAVENOUS

## 2016-05-16 MED ORDER — PALONOSETRON HCL INJECTION 0.25 MG/5ML
0.2500 mg | Freq: Once | INTRAVENOUS | Status: AC
  Administered 2016-05-16: 0.25 mg via INTRAVENOUS

## 2016-05-16 MED ORDER — SODIUM CHLORIDE 0.9 % IV SOLN
Freq: Once | INTRAVENOUS | Status: AC
  Administered 2016-05-16: 12:00:00 via INTRAVENOUS

## 2016-05-16 MED ORDER — FAMOTIDINE IN NACL 20-0.9 MG/50ML-% IV SOLN
INTRAVENOUS | Status: AC
  Filled 2016-05-16: qty 50

## 2016-05-16 NOTE — Progress Notes (Signed)
[facility] [phone]) [phone]   Fax:([phone]) [phone]  OFFICE PROGRESS NOTE  [name], DO [address] [place] [address]  DIAGNOSIS: Stage IIIa (T2b, N2, M0) non-small cell lung cancer, squamous cell carcinoma, presented with large left lower lobe lung mass in addition to highly suspicious mediastinal lymphadenopathy. The patient could have a stage IV lung cancer if the hypothalamic lesion is consistent with metastatic disease.  PRIOR THERAPY: None.  CURRENT THERAPY: Concurrent chemoradiation with weekly carboplatin for AUC of 2 and paclitaxel 45 MG/M2  INTERVAL HISTORY: Carol [name] 82 y.o. female returns to the clinic today for follow-up visit accompanied by her daughter and son-in-law. The patient tolerated the first week of her treatment well except for soreness throat and mild odynophagia 2 days last week. She denied having any significant fatigue or weakness. She denied having any chest pain, shortness of breath, cough or hemoptysis. She has no significant weight loss or night sweats. She is here today to start cycle #2 of her treatment.  MEDICAL HISTORY: Past Medical History  Diagnosis Date  . COPD (chronic obstructive pulmonary disease) ([place])   . Hypertension   . GERD (gastroesophageal reflux disease)   . Cataract   . Fall   . History of esophageal stricture   . Hiatal hernia   . Osteoporosis   . Abdominal aortic aneurysm (AAA) ([place])   . Peripheral arterial disease ([place])   . Coronary artery disease   . Lung cancer ([place])     squamous cell carcinoma LLL    ALLERGIES:  has No Known Allergies.  MEDICATIONS:  Current Outpatient Prescriptions  Medication Sig Dispense Refill  . albuterol (PROVENTIL HFA) 108 (90 Base) MCG/ACT inhaler Inhale 2 puffs into the lungs 2 (two) times daily as needed for shortness of breath. 8 g 5  . amLODipine (NORVASC) 5 MG tablet Take 1 tablet (5 mg total) by mouth daily. 30 tablet 5  . aspirin EC 81 MG tablet  Take 81 mg by mouth daily.    Marland Kitchen atorvastatin (LIPITOR) 10 MG tablet Take 1 tablet (10 mg total) by mouth daily at 6 PM. Reported on 01/29/2016 30 tablet 11  . LORazepam (ATIVAN) 0.5 MG tablet Take 1 tablet 44mns prior to MRI. Can take an additional tablet right before test if needed. 2 tablet 0  . omeprazole (PRILOSEC) 40 MG capsule Take 1 capsule (40 mg total) by mouth daily. 90 capsule 3  . prochlorperazine (COMPAZINE) 10 MG tablet Take 1 tablet (10 mg total) by mouth every 6 (six) hours as needed for nausea or vomiting. 30 tablet 0  . Sennosides (EX-LAX) 15 MG TABS Take 15 mg by mouth daily as needed (For constipation.).     .Marland KitchenSYMBICORT 80-4.5 MCG/ACT inhaler Inhale 2 puffs into the lungs 2 (two) times daily as needed (FOR SOB). (Patient taking differently: Inhale 2 puffs into the lungs 2 times daily at 12 noon and 4 pm. ) 1 Inhaler 11   No current facility-administered medications for this visit.    SURGICAL HISTORY:  Past Surgical History  Procedure Laterality Date  . Eye surgery    . Colonoscopy    . Esophagogastroduodenoscopy (egd) with esophageal dilation    . Cataract extraction    . Esopheal dilation  02-26-16  . Endobronchial ultrasound Bilateral 03/28/2016    Procedure: ENDOBRONCHIAL ULTRASOUND;  Surgeon: JJavier Glazier MD;  Location: [facility];  Service: Cardiopulmonary;  Laterality: Bilateral;  dr. nAshok Cordiainsists on mac not  general anesthesia    REVIEW OF SYSTEMS:  A comprehensive review of systems was negative except for: Ears, nose, mouth, throat, and face: positive for sore throat Gastrointestinal: positive for odynophagia   PHYSICAL EXAMINATION: General appearance: alert, cooperative and no distress Head: Normocephalic, without obvious abnormality, atraumatic Neck: no adenopathy, no JVD, supple, symmetrical, trachea midline and thyroid not enlarged, symmetric, no tenderness/mass/nodules Lymph nodes: Cervical, supraclavicular, and axillary nodes normal. Resp: clear  to auscultation bilaterally Back: symmetric, no curvature. ROM normal. No CVA tenderness. Cardio: regular rate and rhythm, S1, S2 normal, no murmur, click, rub or gallop GI: soft, non-tender; bowel sounds normal; no masses,  no organomegaly Extremities: extremities normal, atraumatic, no cyanosis or edema  ECOG PERFORMANCE STATUS: 1 - Symptomatic but completely ambulatory  Blood pressure 142/65, pulse 90, temperature 98.6 F (37 C), temperature source Oral, resp. rate 17, weight 122 lb 9.6 oz (55.611 kg), SpO2 97 %.  LABORATORY DATA: Lab Results  Component Value Date   WBC 5.7 05/16/2016   HGB 12.8 05/16/2016   HCT 38.9 05/16/2016   MCV 90.7 05/16/2016   PLT 290 05/16/2016      Chemistry      Component Value Date/Time   NA 139 05/09/2016 0923   NA 137 02/10/2016 1412   K 3.3* 05/09/2016 0923   K 3.9 02/10/2016 1412   CL 99 02/10/2016 1412   CO2 22 05/09/2016 0923   CO2 24 02/10/2016 1412   BUN 11.0 05/09/2016 0923   BUN 11 04/18/2016 1246   CREATININE 0.7 05/09/2016 0923   CREATININE 0.59 04/18/2016 1246   CREATININE 0.58* 02/10/2016 1412      Component Value Date/Time   CALCIUM 8.7 05/09/2016 0923   CALCIUM 8.9 02/10/2016 1412   ALKPHOS 108 05/09/2016 0923   ALKPHOS 102 02/10/2016 1412   AST 16 05/09/2016 0923   AST 15 02/10/2016 1412   ALT 12 05/09/2016 0923   ALT 12 02/10/2016 1412   BILITOT 0.35 05/09/2016 0923   BILITOT 0.4 02/10/2016 1412       RADIOGRAPHIC STUDIES: Mr Jeri Cos Wo Contrast  Apr 26, 2016  CLINICAL DATA:  Non-small cell cancer of the left lung. Evaluate for metastatic disease. EXAM: MRI HEAD WITHOUT AND WITH CONTRAST TECHNIQUE: Multiplanar, multiecho pulse sequences of the brain and surrounding structures were obtained without and with intravenous contrast. CONTRAST:  61m MULTIHANCE GADOBENATE DIMEGLUMINE 529 MG/ML IV SOLN COMPARISON:  None. FINDINGS: Calvarium and upper cervical spine: No focal marrow signal abnormality. Orbits: Negative.  Sinuses and Mastoids: Fluid level and frothy secretions in the right maxillary sinus. Brain: 5 mm focus of nodular enhancement along the hypothalamus, eccentric to the right. No other abnormal focus of enhancement intracranially. Age congruent small-vessel ischemic change in the cerebral white matter. Tiny cystic spaces in the left pons with susceptibility, presumably remote infarcts. No generalized hemorrhage. No acute infarct, hemorrhage, hydrocephalus, or major vessel occlusion. IMPRESSION: 5 mm enhancing mass in the hypothalamic region. Inflammatory processes usually predominate in this region and would correlate for any endocrinopathy. Given history and demographics, solitary metastasis is the primary concern and close imaging follow-up is recommended. Electronically Signed   By: JMonte FantasiaM.D.   On: [id number]    ASSESSMENT AND PLAN: This is a very pleasant 82years old white female with stage IIIa non-small cell lung cancer, squamous cell carcinoma currently undergoing concurrent chemoradiation with weekly carboplatin and paclitaxel is status post 1 week of treatment. The patient is tolerating her treatment well except for mild  sore throat and odynophagia. I recommended for her to continue her treatment as scheduled. I also advised the patient to use over-the-counter Prilosec or Nexium. If her odynophagia is getting worse, we will consider the patient for treatment with Carafate. She will come back for follow-up visit in 2 weeks for evaluation and management of any adverse effect of her treatment. She was advised to call immediately if she has any concerning symptoms in the interval. The patient voices understanding of current disease status and treatment options and is in agreement with the current care plan.  All questions were answered. The patient knows to call the clinic with any problems, questions or concerns. We can certainly see the patient much sooner if  necessary.  Disclaimer: This note was dictated with voice recognition software. Similar sounding words can inadvertently be transcribed and may not be corrected upon review.

## 2016-05-16 NOTE — Progress Notes (Signed)
Oncology Nurse Navigator Documentation  Oncology Nurse Navigator Flowsheets 05/16/2016  Navigator Location [facility]  Navigator Encounter Type Clinic/MDC  Patient Visit Type MedOnc  Treatment Phase Treatment  Barriers/Navigation Needs No barriers at this time  Acuity Level 1  Acuity Level 1 Minimal follow up required  Time Spent with Patient 15   Spoke to patient and family today.  Patient is tolerating radiation fairly well.  She is having some pain when she swallows but has resolved today.  Dr. Julien Nordmann aware and addressed with patient.  No barriers identified at this time.

## 2016-05-16 NOTE — Progress Notes (Signed)
82 year old female diagnosed with lung cancer.  She is a patient of Dr. Julien Nordmann.  Past medical history includes COPD, hypertension, GERD, esophageal cancer, hiatal hernia, osteoporosis, AAA, CAD, and tobacco.  Medications include Lipitor, Prilosec, and Compazine.  Labs were reviewed.  Height: 64 inches. Weight: 122.7 pounds on June 9. Usual body weight: 122-125 pounds. BMI: 21.05.  Patient reports she feels well and is eating well. Denies nausea, vomiting, or diarrhea.  Reports occasional constipation.  However, this has been ongoing issue over her lifetime.  Reports she takes a stool softener. Has tried Ensure in the past and likes it.  Nutrition diagnosis:  Predicted suboptimal energy intake related to lung cancer and associated treatments as evidenced by a condition for which research shows suboptimal energy intake.  Intervention:  Patient was educated to consume small frequent meals and snacks utilizing high protein foods and adequate calories for weight maintenance. Reviewed high protein foods and provided fact sheet on increasing calories and protein. Recommended patient have oral nutrition supplements available for days when she does not feel well. Patient has tried ensure in the past and enjoys this.  Provided coupons for purchase. Questions were answered.  Teach back method used.  Contact information was given.  Monitoring, evaluation, goals: Patient will tolerate adequate calories and protein to promote weight maintenance.  Next visit: Monday, June 26 during infusion.  **Disclaimer: This note was dictated with voice recognition software. Similar sounding words can inadvertently be transcribed and this note may contain transcription errors which may not have been corrected upon publication of note.**

## 2016-05-16 NOTE — Patient Instructions (Signed)
[facility] Discharge Instructions for Patients Receiving Chemotherapy  Today you received the following chemotherapy agents: Taxol and Carboplatin.  To help prevent nausea and vomiting after your treatment, we encourage you to take your nausea medication: Compazine 10 mg every 6  Hours as needed.   If you develop nausea and vomiting that is not controlled by your nausea medication, call the clinic.   BELOW ARE SYMPTOMS THAT SHOULD BE REPORTED IMMEDIATELY:  *FEVER GREATER THAN 100.5 F  *CHILLS WITH OR WITHOUT FEVER  NAUSEA AND VOMITING THAT IS NOT CONTROLLED WITH YOUR NAUSEA MEDICATION  *UNUSUAL SHORTNESS OF BREATH  *UNUSUAL BRUISING OR BLEEDING  TENDERNESS IN MOUTH AND THROAT WITH OR WITHOUT PRESENCE OF ULCERS  *URINARY PROBLEMS  *BOWEL PROBLEMS  UNUSUAL RASH Items with * indicate a potential emergency and should be followed up as soon as possible.  Feel free to call the clinic you have any questions or concerns. The clinic phone number is [phone].  Please show the [facility] at check-in to the Emergency Department and triage nurse.

## 2016-05-16 NOTE — Telephone Encounter (Signed)
Per staff phone call and POF I have schedueld appts. Scheduler advised of appts.  JMW  

## 2016-05-17 ENCOUNTER — Ambulatory Visit: Payer: Medicare Other

## 2016-05-17 ENCOUNTER — Telehealth: Payer: Self-pay | Admitting: Internal Medicine

## 2016-05-17 ENCOUNTER — Ambulatory Visit
Admission: RE | Admit: 2016-05-17 | Discharge: 2016-05-17 | Disposition: A | Discharge status: Home / Self Care | Payer: Medicare Other | Source: Ambulatory Visit | Attending: Radiation Oncology | Admitting: Radiation Oncology

## 2016-05-17 DIAGNOSIS — Z510 Encounter for antineoplastic radiation therapy: Secondary | ICD-10-CM | POA: Diagnosis not present

## 2016-05-17 NOTE — Telephone Encounter (Signed)
Pt w/ get update sched on next visit

## 2016-05-18 ENCOUNTER — Ambulatory Visit: Payer: Medicare Other

## 2016-05-18 ENCOUNTER — Ambulatory Visit
Admission: RE | Admit: 2016-05-18 | Discharge: 2016-05-18 | Disposition: A | Discharge status: Home / Self Care | Payer: Medicare Other | Source: Ambulatory Visit | Attending: Radiation Oncology | Admitting: Radiation Oncology

## 2016-05-18 DIAGNOSIS — Z510 Encounter for antineoplastic radiation therapy: Secondary | ICD-10-CM | POA: Diagnosis not present

## 2016-05-18 NOTE — Addendum Note (Signed)
Encounter addended by: Heywood Footman, RN on: 05/18/2016  3:50 PM<BR>     Documentation filed: Notes Section, Inpatient Patient Education, Chief Complaint Section

## 2016-05-19 ENCOUNTER — Ambulatory Visit: Payer: Medicare Other

## 2016-05-19 ENCOUNTER — Ambulatory Visit
Admission: RE | Admit: 2016-05-19 | Discharge: 2016-05-19 | Disposition: A | Discharge status: Home / Self Care | Payer: Medicare Other | Source: Ambulatory Visit | Attending: Radiation Oncology | Admitting: Radiation Oncology

## 2016-05-19 ENCOUNTER — Other Ambulatory Visit: Payer: Self-pay | Admitting: Medical Oncology

## 2016-05-19 DIAGNOSIS — Z510 Encounter for antineoplastic radiation therapy: Secondary | ICD-10-CM | POA: Diagnosis not present

## 2016-05-19 DIAGNOSIS — C3492 Malignant neoplasm of unspecified part of left bronchus or lung: Secondary | ICD-10-CM

## 2016-05-20 ENCOUNTER — Ambulatory Visit
Admission: RE | Admit: 2016-05-20 | Discharge: 2016-05-20 | Disposition: A | Discharge status: Home / Self Care | Payer: Medicare Other | Source: Ambulatory Visit | Attending: Radiation Oncology | Admitting: Radiation Oncology

## 2016-05-20 ENCOUNTER — Ambulatory Visit: Payer: Medicare Other

## 2016-05-20 ENCOUNTER — Encounter: Payer: Self-pay | Admitting: Radiation Oncology

## 2016-05-20 VITALS — BP 170/55 | HR 67 | Resp 16 | Wt 122.4 lb

## 2016-05-20 DIAGNOSIS — Z510 Encounter for antineoplastic radiation therapy: Secondary | ICD-10-CM | POA: Diagnosis not present

## 2016-05-20 DIAGNOSIS — C3492 Malignant neoplasm of unspecified part of left bronchus or lung: Secondary | ICD-10-CM

## 2016-05-20 MED ORDER — SUCRALFATE 1 G PO TABS: 1.0000 g | ORAL_TABLET | Freq: Three times a day (TID) | ORAL | Status: DC

## 2016-05-20 NOTE — Progress Notes (Signed)
Department of Radiation Oncology  Phone:  [phone] Fax:        [phone]  Weekly Treatment Note    Name: Carol [name] Date: 05/21/2016 MRN: [id number] DOB: [birth year]   Diagnosis:     ICD-9-CM ICD-10-CM   1. Non-small cell cancer of left lung (HCC) 162.9 C34.92      Current dose: 18 Gy  Current fraction:9   MEDICATIONS: Current Outpatient Prescriptions  Medication Sig Dispense Refill  . albuterol (PROVENTIL HFA) 108 (90 Base) MCG/ACT inhaler Inhale 2 puffs into the lungs 2 (two) times daily as needed for shortness of breath. 8 g 5  . amLODipine (NORVASC) 5 MG tablet Take 1 tablet (5 mg total) by mouth daily. 30 tablet 5  . aspirin EC 81 MG tablet Take 81 mg by mouth daily.    Marland Kitchen atorvastatin (LIPITOR) 10 MG tablet Take 1 tablet (10 mg total) by mouth daily at 6 PM. Reported on 01/29/2016 30 tablet 11  . LORazepam (ATIVAN) 0.5 MG tablet Take 1 tablet 28mns prior to MRI. Can take an additional tablet right before test if needed. 2 tablet 0  . omeprazole (PRILOSEC) 40 MG capsule Take 1 capsule (40 mg total) by mouth daily. 90 capsule 3  . prochlorperazine (COMPAZINE) 10 MG tablet Take 1 tablet (10 mg total) by mouth every 6 (six) hours as needed for nausea or vomiting. 30 tablet 0  . Sennosides (EX-LAX) 15 MG TABS Take 15 mg by mouth daily as needed (For constipation.).     .Marland KitchenSYMBICORT 80-4.5 MCG/ACT inhaler Inhale 2 puffs into the lungs 2 (two) times daily as needed (FOR SOB). (Patient taking differently: Inhale 2 puffs into the lungs 2 times daily at 12 noon and 4 pm. ) 1 Inhaler 11  . sucralfate (CARAFATE) 1 g tablet Take 1 tablet (1 g total) by mouth 4 (four) times daily -  with meals and at bedtime. 120 tablet 1   No current facility-administered medications for this encounter.     ALLERGIES: Review of patient's allergies indicates no known allergies.   LABORATORY DATA:  Lab Results  Component Value Date   WBC 5.7 05/16/2016   HGB 12.8 05/16/2016   HCT  38.9 05/16/2016   MCV 90.7 05/16/2016   PLT 290 05/16/2016   Lab Results  Component Value Date   NA 140 05/16/2016   K 3.6 05/16/2016   CL 99 02/10/2016   CO2 24 05/16/2016   Lab Results  Component Value Date   ALT 14 05/16/2016   AST 16 05/16/2016   ALKPHOS 103 05/16/2016   BILITOT 0.39 05/16/2016     NARRATIVE: Carol Yearwoodwas seen today for weekly treatment management. The chart was checked and the patient's films were reviewed.  Denies pain, hemoptysis, fatigue, or skin changes within the treatment field. Reports mild cough and shortness of breath with exertion. Reports new onset of difficulty swallowing. Reports using radiaplex bid as directed. Hoarseness noted.  PHYSICAL EXAMINATION: weight is 122 lb 6.4 oz (55.52 kg). Her blood pressure is 170/55 and her pulse is 67. Her respiration is 16 and oxygen saturation is 94%.   Present in a wheelchair.  ASSESSMENT: The patient is doing satisfactorily with treatment.  PLAN: We will continue with the patient's radiation treatment as planned.  ------------------------------------------------  JJodelle Gross MD, PhD  This document serves as a record of services personally performed by AShona Simpson PA-C and JKyung Rudd MD. It was created on their behalf by JDarcus Austin a trained  medical scribe. The creation of this record is based on the scribe's personal observations and the providers' statements to them. This document has been checked and approved by the attending provider.

## 2016-05-20 NOTE — Progress Notes (Signed)
Weight and vitals stable. Denies pain. Reports mild cough and shortness of breath with exertion. Denies hemoptysis. Reports new onset of difficulty swallowing. Denies fatigue. Denies skin changes within treatment field. Reports using radiaplex bid as directed. Hoarseness noted.   BP 170/55 mmHg  Pulse 67  Resp 16  Wt 122 lb 6.4 oz (55.52 kg)  SpO2 94% Wt Readings from Last 3 Encounters:  05/20/16 122 lb 6.4 oz (55.52 kg)  05/16/16 122 lb 9.6 oz (55.611 kg)  05/13/16 122 lb 11.2 oz (55.656 kg)

## 2016-05-23 ENCOUNTER — Other Ambulatory Visit (HOSPITAL_BASED_OUTPATIENT_CLINIC_OR_DEPARTMENT_OTHER): Payer: Medicare Other

## 2016-05-23 ENCOUNTER — Ambulatory Visit (HOSPITAL_BASED_OUTPATIENT_CLINIC_OR_DEPARTMENT_OTHER): Payer: Medicare Other

## 2016-05-23 ENCOUNTER — Ambulatory Visit
Admission: RE | Admit: 2016-05-23 | Discharge: 2016-05-23 | Disposition: A | Discharge status: Home / Self Care | Payer: Medicare Other | Source: Ambulatory Visit | Attending: Radiation Oncology | Admitting: Radiation Oncology

## 2016-05-23 ENCOUNTER — Ambulatory Visit: Payer: Medicare Other

## 2016-05-23 VITALS — BP 143/111 | HR 68 | Temp 97.9°F

## 2016-05-23 DIAGNOSIS — Z510 Encounter for antineoplastic radiation therapy: Secondary | ICD-10-CM | POA: Diagnosis not present

## 2016-05-23 DIAGNOSIS — C3432 Malignant neoplasm of lower lobe, left bronchus or lung: Secondary | ICD-10-CM | POA: Diagnosis present

## 2016-05-23 DIAGNOSIS — Z5111 Encounter for antineoplastic chemotherapy: Secondary | ICD-10-CM

## 2016-05-23 DIAGNOSIS — C3492 Malignant neoplasm of unspecified part of left bronchus or lung: Secondary | ICD-10-CM

## 2016-05-23 LAB — COMPREHENSIVE METABOLIC PANEL
ALK PHOS: 99 U/L (ref 40–150)
ALT: 15 U/L (ref 0–55)
ANION GAP: 9 meq/L (ref 3–11)
AST: 13 U/L (ref 5–34)
Albumin: 3 g/dL — ABNORMAL LOW (ref 3.5–5.0)
BILIRUBIN TOTAL: 0.4 mg/dL (ref 0.20–1.20)
BUN: 10.8 mg/dL (ref 7.0–26.0)
CALCIUM: 8.4 mg/dL (ref 8.4–10.4)
CO2: 24 meq/L (ref 22–29)
Chloride: 105 mEq/L (ref 98–109)
Creatinine: 0.7 mg/dL (ref 0.6–1.1)
EGFR: 80 mL/min/{1.73_m2} — AB (ref >90)
Glucose: 151 mg/dl — ABNORMAL HIGH (ref 70–140)
Potassium: 3.1 mEq/L — ABNORMAL LOW (ref 3.5–5.1)
Sodium: 138 mEq/L (ref 136–145)
TOTAL PROTEIN: 6.8 g/dL (ref 6.4–8.3)

## 2016-05-23 LAB — CBC WITH DIFFERENTIAL/PLATELET
BASO%: 1 % (ref 0.0–2.0)
Basophils Absolute: 0 10*3/uL (ref 0.0–0.1)
EOS ABS: 0.1 10*3/uL (ref 0.0–0.5)
EOS%: 1.8 % (ref 0.0–7.0)
HEMATOCRIT: 38.7 % (ref 34.8–46.6)
HGB: 12.9 g/dL (ref 11.6–15.9)
LYMPH%: 19.8 % (ref 14.0–49.7)
MCH: 29.8 pg (ref 25.1–34.0)
MCHC: 33.3 g/dL (ref 31.5–36.0)
MCV: 89.6 fL (ref 79.5–101.0)
MONO#: 0.6 10*3/uL (ref 0.1–0.9)
MONO%: 13.5 % (ref 0.0–14.0)
NEUT%: 63.9 % (ref 38.4–76.8)
NEUTROS ABS: 3 10*3/uL (ref 1.5–6.5)
PLATELETS: 294 10*3/uL (ref 145–400)
RBC: 4.32 10*6/uL (ref 3.70–5.45)
RDW: 16.2 % — ABNORMAL HIGH (ref 11.2–14.5)
WBC: 4.8 10*3/uL (ref 3.9–10.3)
lymph#: 0.9 10*3/uL (ref 0.9–3.3)

## 2016-05-23 MED ORDER — FAMOTIDINE IN NACL 20-0.9 MG/50ML-% IV SOLN
20.0000 mg | Freq: Once | INTRAVENOUS | Status: AC
  Administered 2016-05-23: 20 mg via INTRAVENOUS

## 2016-05-23 MED ORDER — SODIUM CHLORIDE 0.9 % IV SOLN
Freq: Once | INTRAVENOUS | Status: AC
  Administered 2016-05-23: 14:00:00 via INTRAVENOUS

## 2016-05-23 MED ORDER — DIPHENHYDRAMINE HCL 50 MG/ML IJ SOLN
50.0000 mg | Freq: Once | INTRAMUSCULAR | Status: AC
  Administered 2016-05-23: 50 mg via INTRAVENOUS

## 2016-05-23 MED ORDER — PALONOSETRON HCL INJECTION 0.25 MG/5ML
0.2500 mg | Freq: Once | INTRAVENOUS | Status: AC
  Administered 2016-05-23: 0.25 mg via INTRAVENOUS

## 2016-05-23 MED ORDER — SODIUM CHLORIDE 0.9 % IV SOLN
125.6000 mg | Freq: Once | INTRAVENOUS | Status: AC
  Administered 2016-05-23: 130 mg via INTRAVENOUS
  Filled 2016-05-23: qty 13

## 2016-05-23 MED ORDER — PALONOSETRON HCL INJECTION 0.25 MG/5ML
INTRAVENOUS | Status: AC
  Filled 2016-05-23: qty 5

## 2016-05-23 MED ORDER — SODIUM CHLORIDE 0.9 % IV SOLN
20.0000 mg | Freq: Once | INTRAVENOUS | Status: AC
  Administered 2016-05-23: 20 mg via INTRAVENOUS
  Filled 2016-05-23: qty 2

## 2016-05-23 MED ORDER — POTASSIUM CHLORIDE CRYS ER 20 MEQ PO TBCR: 20.0000 meq | EXTENDED_RELEASE_TABLET | Freq: Every day | ORAL | Status: DC

## 2016-05-23 MED ORDER — FAMOTIDINE IN NACL 20-0.9 MG/50ML-% IV SOLN
INTRAVENOUS | Status: AC
  Filled 2016-05-23: qty 50

## 2016-05-23 MED ORDER — DIPHENHYDRAMINE HCL 50 MG/ML IJ SOLN
INTRAMUSCULAR | Status: AC
  Filled 2016-05-23: qty 1

## 2016-05-23 MED ORDER — SODIUM CHLORIDE 0.9 % IV SOLN
45.0000 mg/m2 | Freq: Once | INTRAVENOUS | Status: AC
  Administered 2016-05-23: 72 mg via INTRAVENOUS
  Filled 2016-05-23: qty 12

## 2016-05-23 NOTE — Progress Notes (Signed)
BP= 143/ 111, prior to treatment and serum K+ level of 3.1, reported to Dr Julien Nordmann. Per Dr Julien Nordmann, Script sent to pharmacy for [place] 35mq, one daily x7d, and ok to treat today.

## 2016-05-23 NOTE — Progress Notes (Signed)
Ok to treat today per MD [facility] with potassium level and BP today.  Pt is asymptomatic and stated "that is a good BP for me".  She is aware there is a K++ script for her to pick up today.

## 2016-05-24 ENCOUNTER — Ambulatory Visit: Payer: Medicare Other

## 2016-05-24 ENCOUNTER — Ambulatory Visit
Admission: RE | Admit: 2016-05-24 | Discharge: 2016-05-24 | Disposition: A | Discharge status: Home / Self Care | Payer: Medicare Other | Source: Ambulatory Visit | Attending: Radiation Oncology | Admitting: Radiation Oncology

## 2016-05-24 DIAGNOSIS — Z510 Encounter for antineoplastic radiation therapy: Secondary | ICD-10-CM | POA: Diagnosis not present

## 2016-05-25 ENCOUNTER — Ambulatory Visit
Admission: RE | Admit: 2016-05-25 | Discharge: 2016-05-25 | Disposition: A | Discharge status: Home / Self Care | Payer: Medicare Other | Source: Ambulatory Visit | Attending: Radiation Oncology | Admitting: Radiation Oncology

## 2016-05-25 ENCOUNTER — Ambulatory Visit: Payer: Medicare Other

## 2016-05-25 DIAGNOSIS — Z510 Encounter for antineoplastic radiation therapy: Secondary | ICD-10-CM | POA: Diagnosis not present

## 2016-05-26 ENCOUNTER — Ambulatory Visit
Admission: RE | Admit: 2016-05-26 | Discharge: 2016-05-26 | Disposition: A | Discharge status: Home / Self Care | Payer: Medicare Other | Source: Ambulatory Visit | Attending: Radiation Oncology | Admitting: Radiation Oncology

## 2016-05-26 ENCOUNTER — Ambulatory Visit: Payer: Medicare Other

## 2016-05-26 DIAGNOSIS — Z510 Encounter for antineoplastic radiation therapy: Secondary | ICD-10-CM | POA: Diagnosis not present

## 2016-05-27 ENCOUNTER — Ambulatory Visit
Admission: RE | Admit: 2016-05-27 | Discharge: 2016-05-27 | Disposition: A | Discharge status: Home / Self Care | Payer: Medicare Other | Source: Ambulatory Visit | Attending: Radiation Oncology | Admitting: Radiation Oncology

## 2016-05-27 ENCOUNTER — Ambulatory Visit: Payer: Medicare Other

## 2016-05-27 ENCOUNTER — Ambulatory Visit: Payer: No Typology Code available for payment source | Admitting: Cardiology

## 2016-05-27 ENCOUNTER — Other Ambulatory Visit: Payer: Self-pay | Admitting: Medical Oncology

## 2016-05-27 VITALS — BP 172/78 | HR 85 | Temp 97.6°F | Resp 16 | Wt 123.7 lb

## 2016-05-27 DIAGNOSIS — Z510 Encounter for antineoplastic radiation therapy: Secondary | ICD-10-CM | POA: Diagnosis not present

## 2016-05-27 DIAGNOSIS — C3492 Malignant neoplasm of unspecified part of left bronchus or lung: Secondary | ICD-10-CM

## 2016-05-27 MED ORDER — HYDROCODONE-ACETAMINOPHEN 7.5-325 MG/15ML PO SOLN: 15.0000 mL | Freq: Four times a day (QID) | ORAL | Status: DC | PRN

## 2016-05-27 NOTE — Progress Notes (Signed)
  Radiation Oncology         [phone]   Name: Carol [name] MRN: [id number]   Date: 05/27/2016  DOB: 20-Apr-1932   Weekly Radiation Therapy Management    ICD-9-CM ICD-10-CM   1. Non-small cell cancer of left lung (HCC) 162.9 C34.92 HYDROcodone-acetaminophen (HYCET) 7.5-325 mg/15 ml solution    Current Dose: 28 Gy  Planned Dose:  66 Gy  Narrative The patient presents for routine under treatment assessment.  Denies pain. Reports mild cough and shortness of breath with exertion. Denies hemoptysis. Reports difficulty swallowing continues, but has been manageable with carafate. Reports fatigue. Denies skin changes within the treatment field. Reports using radiaplex bid as directed. Hoarseness has improved. .   The patient is without complaint. Set-up films were reviewed. The chart was checked.  Physical Findings  weight is 123 lb 11.2 oz (56.11 kg). Her oral temperature is 97.6 F (36.4 C). Her blood pressure is 172/78 and her pulse is 85. Her respiration is 16 and oxygen saturation is 96%. . Weight essentially stable.  No significant changes. The patient is ambulatory with a wheelchair.  Impression The patient is tolerating radiation.  Plan Continue treatment as planned. I will prescribe Lortab Elixer 158m to help with the patient's pain with swallowing.      MSheral ApleyMTammi Klippel M.D.  This document serves as a record of services personally performed by MTyler Pita MD. It was created on his behalf by JDarcus Austin a trained medical scribe. The creation of this record is based on the scribe's personal observations and the provider's statements to them. This document has been checked and approved by the attending provider.

## 2016-05-27 NOTE — Progress Notes (Signed)
Weight and vitals stable. Denies pain. Reports mild cough and shortness of breath with exertion. Denies hemoptysis. Reports difficulty swallowing continues but, has been manageable with carafate. Reports fatigue. Denies skin changes within treatment field. Reports using radiaplex bid as directed. Hoarseness improved.   BP 172/78 mmHg  Pulse 85  Temp(Src) 97.6 F (36.4 C) (Oral)  Resp 16  Wt 123 lb 11.2 oz (56.11 kg)  SpO2 96% Wt Readings from Last 3 Encounters:  05/27/16 123 lb 11.2 oz (56.11 kg)  05/20/16 122 lb 6.4 oz (55.52 kg)  05/16/16 122 lb 9.6 oz (55.611 kg)

## 2016-05-30 ENCOUNTER — Ambulatory Visit (HOSPITAL_BASED_OUTPATIENT_CLINIC_OR_DEPARTMENT_OTHER): Payer: Medicare Other

## 2016-05-30 ENCOUNTER — Encounter: Payer: Medicare Other | Admitting: Nutrition

## 2016-05-30 ENCOUNTER — Other Ambulatory Visit: Payer: Medicare Other

## 2016-05-30 ENCOUNTER — Ambulatory Visit: Payer: Medicare Other | Admitting: Nutrition

## 2016-05-30 ENCOUNTER — Other Ambulatory Visit (HOSPITAL_BASED_OUTPATIENT_CLINIC_OR_DEPARTMENT_OTHER): Payer: Medicare Other

## 2016-05-30 ENCOUNTER — Telehealth: Payer: Self-pay | Admitting: Internal Medicine

## 2016-05-30 ENCOUNTER — Telehealth: Payer: Self-pay | Admitting: *Deleted

## 2016-05-30 ENCOUNTER — Ambulatory Visit
Admission: RE | Admit: 2016-05-30 | Discharge: 2016-05-30 | Disposition: A | Discharge status: Home / Self Care | Payer: Medicare Other | Source: Ambulatory Visit | Attending: Radiation Oncology | Admitting: Radiation Oncology

## 2016-05-30 ENCOUNTER — Ambulatory Visit (HOSPITAL_BASED_OUTPATIENT_CLINIC_OR_DEPARTMENT_OTHER): Payer: Medicare Other | Admitting: Internal Medicine

## 2016-05-30 ENCOUNTER — Ambulatory Visit: Payer: Medicare Other

## 2016-05-30 ENCOUNTER — Encounter: Payer: Self-pay | Admitting: Internal Medicine

## 2016-05-30 VITALS — BP 139/68 | HR 87 | Temp 98.0°F | Resp 18 | Ht 64.0 in | Wt 122.2 lb

## 2016-05-30 DIAGNOSIS — C3432 Malignant neoplasm of lower lobe, left bronchus or lung: Secondary | ICD-10-CM

## 2016-05-30 DIAGNOSIS — Z5111 Encounter for antineoplastic chemotherapy: Secondary | ICD-10-CM | POA: Diagnosis present

## 2016-05-30 DIAGNOSIS — C3492 Malignant neoplasm of unspecified part of left bronchus or lung: Secondary | ICD-10-CM

## 2016-05-30 DIAGNOSIS — R1310 Dysphagia, unspecified: Secondary | ICD-10-CM | POA: Diagnosis not present

## 2016-05-30 DIAGNOSIS — Z510 Encounter for antineoplastic radiation therapy: Secondary | ICD-10-CM | POA: Diagnosis not present

## 2016-05-30 LAB — COMPREHENSIVE METABOLIC PANEL
ALT: 17 U/L (ref 0–55)
ANION GAP: 11 meq/L (ref 3–11)
AST: 14 U/L (ref 5–34)
Albumin: 3 g/dL — ABNORMAL LOW (ref 3.5–5.0)
Alkaline Phosphatase: 92 U/L (ref 40–150)
BUN: 11.3 mg/dL (ref 7.0–26.0)
CO2: 23 meq/L (ref 22–29)
Calcium: 8.4 mg/dL (ref 8.4–10.4)
Chloride: 106 mEq/L (ref 98–109)
Creatinine: 0.7 mg/dL (ref 0.6–1.1)
EGFR: 78 mL/min/{1.73_m2} — AB (ref >90)
GLUCOSE: 126 mg/dL (ref 70–140)
POTASSIUM: 3.3 meq/L — AB (ref 3.5–5.1)
SODIUM: 139 meq/L (ref 136–145)
Total Bilirubin: 0.34 mg/dL (ref 0.20–1.20)
Total Protein: 6.5 g/dL (ref 6.4–8.3)

## 2016-05-30 LAB — CBC WITH DIFFERENTIAL/PLATELET
BASO%: 0.6 % (ref 0.0–2.0)
BASOS ABS: 0 10*3/uL (ref 0.0–0.1)
EOS ABS: 0.1 10*3/uL (ref 0.0–0.5)
EOS%: 2.2 % (ref 0.0–7.0)
HCT: 38.3 % (ref 34.8–46.6)
HGB: 12.8 g/dL (ref 11.6–15.9)
LYMPH%: 17.1 % (ref 14.0–49.7)
MCH: 29.6 pg (ref 25.1–34.0)
MCHC: 33.4 g/dL (ref 31.5–36.0)
MCV: 88.7 fL (ref 79.5–101.0)
MONO#: 0.5 10*3/uL (ref 0.1–0.9)
MONO%: 13.5 % (ref 0.0–14.0)
NEUT#: 2.4 10*3/uL (ref 1.5–6.5)
NEUT%: 66.6 % (ref 38.4–76.8)
Platelets: 235 10*3/uL (ref 145–400)
RBC: 4.32 10*6/uL (ref 3.70–5.45)
RDW: 16 % — ABNORMAL HIGH (ref 11.2–14.5)
WBC: 3.6 10*3/uL — AB (ref 3.9–10.3)
lymph#: 0.6 10*3/uL — ABNORMAL LOW (ref 0.9–3.3)

## 2016-05-30 MED ORDER — SODIUM CHLORIDE 0.9 % IV SOLN
Freq: Once | INTRAVENOUS | Status: AC
  Administered 2016-05-30: 15:00:00 via INTRAVENOUS

## 2016-05-30 MED ORDER — SODIUM CHLORIDE 0.9 % IV SOLN
125.6000 mg | Freq: Once | INTRAVENOUS | Status: AC
  Administered 2016-05-30: 130 mg via INTRAVENOUS
  Filled 2016-05-30: qty 13

## 2016-05-30 MED ORDER — FAMOTIDINE IN NACL 20-0.9 MG/50ML-% IV SOLN
20.0000 mg | Freq: Once | INTRAVENOUS | Status: AC
  Administered 2016-05-30: 20 mg via INTRAVENOUS

## 2016-05-30 MED ORDER — DIPHENHYDRAMINE HCL 50 MG/ML IJ SOLN
INTRAMUSCULAR | Status: AC
  Filled 2016-05-30: qty 1

## 2016-05-30 MED ORDER — PALONOSETRON HCL INJECTION 0.25 MG/5ML
0.2500 mg | Freq: Once | INTRAVENOUS | Status: AC
  Administered 2016-05-30: 0.25 mg via INTRAVENOUS

## 2016-05-30 MED ORDER — PACLITAXEL CHEMO INJECTION 300 MG/50ML
45.0000 mg/m2 | Freq: Once | INTRAVENOUS | Status: AC
  Administered 2016-05-30: 72 mg via INTRAVENOUS
  Filled 2016-05-30: qty 12

## 2016-05-30 MED ORDER — PALONOSETRON HCL INJECTION 0.25 MG/5ML
INTRAVENOUS | Status: AC
  Filled 2016-05-30: qty 5

## 2016-05-30 MED ORDER — SODIUM CHLORIDE 0.9 % IV SOLN
20.0000 mg | Freq: Once | INTRAVENOUS | Status: AC
  Administered 2016-05-30: 20 mg via INTRAVENOUS
  Filled 2016-05-30: qty 2

## 2016-05-30 MED ORDER — FAMOTIDINE IN NACL 20-0.9 MG/50ML-% IV SOLN
INTRAVENOUS | Status: AC
  Filled 2016-05-30: qty 50

## 2016-05-30 MED ORDER — DIPHENHYDRAMINE HCL 50 MG/ML IJ SOLN
50.0000 mg | Freq: Once | INTRAMUSCULAR | Status: AC
  Administered 2016-05-30: 50 mg via INTRAVENOUS

## 2016-05-30 NOTE — Progress Notes (Signed)
[facility] [phone]   [phone]  OFFICE PROGRESS NOTE  [name], DO [address] [place] [address]  DIAGNOSIS: Stage IIIA (T2b, N2, M0) non-small cell lung cancer, squamous cell carcinoma, presented with large left lower lobe lung mass in addition to highly suspicious mediastinal lymphadenopathy. The patient could have a stage IV lung cancer if the hypothalamic lesion is consistent with metastatic disease.  PRIOR THERAPY: None.  CURRENT THERAPY: Concurrent chemoradiation with weekly carboplatin for AUC of 2 and paclitaxel 45 MG/M2. Status post 3 cycles.  INTERVAL HISTORY: Carol [name] 82 y.o. female returns to the clinic today for follow-up visit accompanied by her daughter and son-in-law. The patient tolerated the first 3 weeks of her treatment well except for soreness throat and mild odynophagia. She is currently on Carafate as well as liquid hydrocodone. She denied having any significant fatigue or weakness. She denied having any chest pain, shortness of breath, cough or hemoptysis. She has no significant weight loss or night sweats. She is here today to start cycle #4 of her treatment.  MEDICAL HISTORY: Past Medical History  Diagnosis Date  . COPD (chronic obstructive pulmonary disease) ([facility] [facility])   . Hypertension   . GERD (gastroesophageal reflux disease)   . Cataract   . Fall   . History of esophageal stricture   . Hiatal hernia   . Osteoporosis   . Abdominal aortic aneurysm (AAA) ([facility])   . Peripheral arterial disease ([facility])   . Coronary artery disease   . Lung cancer ([place])     squamous cell carcinoma LLL  . Encounter for antineoplastic chemotherapy 05/16/2016    ALLERGIES:  has No Known Allergies.  MEDICATIONS:  Current Outpatient Prescriptions  Medication Sig Dispense Refill  . albuterol (PROVENTIL HFA) 108 (90 Base) MCG/ACT inhaler Inhale 2 puffs into the lungs 2 (two) times daily as needed for shortness of breath. 8 g  5  . amLODipine (NORVASC) 5 MG tablet Take 1 tablet (5 mg total) by mouth daily. 30 tablet 5  . aspirin EC 81 MG tablet Take 81 mg by mouth daily.    Marland Kitchen atorvastatin (LIPITOR) 10 MG tablet Take 1 tablet (10 mg total) by mouth daily at 6 PM. Reported on 01/29/2016 30 tablet 11  . HYDROcodone-acetaminophen (HYCET) 7.5-325 mg/15 ml solution Take 15 mLs by mouth 4 (four) times daily as needed for moderate pain. 120 mL 0  . omeprazole (PRILOSEC) 40 MG capsule Take 1 capsule (40 mg total) by mouth daily. 90 capsule 3  . potassium chloride SA (K-DUR,KLOR-CON) 20 MEQ tablet Take 1 tablet (20 mEq total) by mouth daily. 7 tablet 0  . Sennosides (EX-LAX) 15 MG TABS Take 15 mg by mouth daily as needed (For constipation.).     Marland Kitchen sucralfate (CARAFATE) 1 g tablet Take 1 tablet (1 g total) by mouth 4 (four) times daily -  with meals and at bedtime. 120 tablet 1  . SYMBICORT 80-4.5 MCG/ACT inhaler Inhale 2 puffs into the lungs 2 (two) times daily as needed (FOR SOB). (Patient taking differently: Inhale 2 puffs into the lungs 2 times daily at 12 noon and 4 pm. ) 1 Inhaler 11  . LORazepam (ATIVAN) 0.5 MG tablet Take 1 tablet 59mns prior to MRI. Can take an additional tablet right before test if needed. (Patient not taking: Reported on 05/30/2016) 2 tablet 0  . prochlorperazine (COMPAZINE) 10 MG tablet Take 1 tablet (10 mg total) by mouth every 6 (six)  hours as needed for nausea or vomiting. (Patient not taking: Reported on 05/30/2016) 30 tablet 0   No current facility-administered medications for this visit.    SURGICAL HISTORY:  Past Surgical History  Procedure Laterality Date  . Eye surgery    . Colonoscopy    . Esophagogastroduodenoscopy (egd) with esophageal dilation    . Cataract extraction    . Esopheal dilation  02-26-16  . Endobronchial ultrasound Bilateral 03/28/2016    Procedure: ENDOBRONCHIAL ULTRASOUND;  Surgeon: [name], MD;  Location: [facility];  Service: Cardiopulmonary;  Laterality:  Bilateral;  dr. Ashok Cordia insists on mac not general anesthesia    REVIEW OF SYSTEMS:  A comprehensive review of systems was negative except for: Ears, nose, mouth, throat, and face: positive for sore throat Gastrointestinal: positive for odynophagia   PHYSICAL EXAMINATION: General appearance: alert, cooperative and no distress Head: Normocephalic, without obvious abnormality, atraumatic Neck: no adenopathy, no JVD, supple, symmetrical, trachea midline and thyroid not enlarged, symmetric, no tenderness/mass/nodules Lymph nodes: Cervical, supraclavicular, and axillary nodes normal. Resp: clear to auscultation bilaterally Back: symmetric, no curvature. ROM normal. No CVA tenderness. Cardio: regular rate and rhythm, S1, S2 normal, no murmur, click, rub or gallop GI: soft, non-tender; bowel sounds normal; no masses,  no organomegaly Extremities: extremities normal, atraumatic, no cyanosis or edema  ECOG PERFORMANCE STATUS: 1 - Symptomatic but completely ambulatory  Blood pressure 139/68, pulse 87, temperature 98 F (36.7 C), temperature source Oral, resp. rate 18, height '5\' 4"'$  (1.626 m), weight 122 lb 3.2 oz (55.43 kg), SpO2 95 %.  LABORATORY DATA: Lab Results  Component Value Date   WBC 3.6* 05/30/2016   HGB 12.8 05/30/2016   HCT 38.3 05/30/2016   MCV 88.7 05/30/2016   PLT 235 05/30/2016      Chemistry      Component Value Date/Time   NA 139 05/30/2016 1109   NA 137 02/10/2016 1412   K 3.3* 05/30/2016 1109   K 3.9 02/10/2016 1412   CL 99 02/10/2016 1412   CO2 23 05/30/2016 1109   CO2 24 02/10/2016 1412   BUN 11.3 05/30/2016 1109   BUN 11 04/18/2016 1246   CREATININE 0.7 05/30/2016 1109   CREATININE 0.59 04/18/2016 1246   CREATININE 0.58* 02/10/2016 1412      Component Value Date/Time   CALCIUM 8.4 05/30/2016 1109   CALCIUM 8.9 02/10/2016 1412   ALKPHOS 92 05/30/2016 1109   ALKPHOS 102 02/10/2016 1412   AST 14 05/30/2016 1109   AST 15 02/10/2016 1412   ALT 17 05/30/2016  1109   ALT 12 02/10/2016 1412   BILITOT 0.34 05/30/2016 1109   BILITOT 0.4 02/10/2016 1412       RADIOGRAPHIC STUDIES: No results found.  ASSESSMENT AND PLAN: This is a very pleasant 82 years old white female with stage IIIa non-small cell lung cancer, squamous cell carcinoma currently undergoing concurrent chemoradiation with weekly carboplatin and paclitaxel is status post 3 weeks of treatment. The patient is tolerating her treatment well except for mild sore throat and odynophagia. I recommended for her to continue her treatment as scheduled and she will receive cycle #4 today. For the odynophagia, she is currently on Carafate and liquid hydrocodone. She will come back for follow-up visit in 2 weeks for evaluation and management of any adverse effect of her treatment. She was advised to call immediately if she has any concerning symptoms in the interval. The patient voices understanding of current disease status and treatment options and is in  agreement with the current care plan.  All questions were answered. The patient knows to call the clinic with any problems, questions or concerns. We can certainly see the patient much sooner if necessary.  Disclaimer: This note was dictated with voice recognition software. Similar sounding words can inadvertently be transcribed and may not be corrected upon review.

## 2016-05-30 NOTE — Patient Instructions (Signed)
[facility] Discharge Instructions for Patients Receiving Chemotherapy  Today you received the following chemotherapy agents :  Taxol,  Carboplatin.  To help prevent nausea and vomiting after your treatment, we encourage you to take your nausea medication as prescribed.   If you develop nausea and vomiting that is not controlled by your nausea medication, call the clinic.   BELOW ARE SYMPTOMS THAT SHOULD BE REPORTED IMMEDIATELY:  *FEVER GREATER THAN 100.5 F  *CHILLS WITH OR WITHOUT FEVER  NAUSEA AND VOMITING THAT IS NOT CONTROLLED WITH YOUR NAUSEA MEDICATION  *UNUSUAL SHORTNESS OF BREATH  *UNUSUAL BRUISING OR BLEEDING  TENDERNESS IN MOUTH AND THROAT WITH OR WITHOUT PRESENCE OF ULCERS  *URINARY PROBLEMS  *BOWEL PROBLEMS  UNUSUAL RASH Items with * indicate a potential emergency and should be followed up as soon as possible.  Feel free to call the clinic you have any questions or concerns. The clinic phone number is [phone].  Please show the CHEMO ALERT CARD at check-in to the Emergency Department and triage nurse.   

## 2016-05-30 NOTE — Progress Notes (Signed)
Nutrition follow-up completed with patient being treated for lung cancer. Weight is stable and documented as 122.2 pounds on June 26. Patient states she feels well and has a good appetite. Patient is drinking one to 2 Ensure Plus daily. She denies nausea. Family has questions about increasing potassium in foods.  Nutrition diagnosis: Predicted suboptimal energy intake continues.  Intervention: Patient educated to continue strategies to consume adequate calories and protein to minimize weight loss. Recommended patient continue Ensure Plus twice a day and provided coupons. Educated patient and family on foods rich in potassium and provided fact sheet. Questions were answered.  Teach back method used.  Monitoring, evaluation, goals: Patient will tolerate adequate calories and protein to minimize weight loss.  Next visit: To be scheduled as needed.  **Disclaimer: This note was dictated with voice recognition software. Similar sounding words can inadvertently be transcribed and this note may contain transcription errors which may not have been corrected upon publication of note.**

## 2016-05-30 NOTE — Telephone Encounter (Signed)
Added 7/17, gave pt cal & avs

## 2016-05-30 NOTE — Telephone Encounter (Signed)
Per staff phone call and POF I have schedueld appts. Scheduler advised of appts.  JMW  

## 2016-05-31 ENCOUNTER — Ambulatory Visit
Admission: RE | Admit: 2016-05-31 | Discharge: 2016-05-31 | Disposition: A | Discharge status: Home / Self Care | Payer: Medicare Other | Source: Ambulatory Visit | Attending: Radiation Oncology | Admitting: Radiation Oncology

## 2016-05-31 ENCOUNTER — Ambulatory Visit: Payer: No Typology Code available for payment source | Admitting: Cardiology

## 2016-05-31 ENCOUNTER — Ambulatory Visit: Payer: Medicare Other

## 2016-05-31 DIAGNOSIS — Z510 Encounter for antineoplastic radiation therapy: Secondary | ICD-10-CM | POA: Diagnosis not present

## 2016-06-01 ENCOUNTER — Ambulatory Visit
Admission: RE | Admit: 2016-06-01 | Discharge: 2016-06-01 | Disposition: A | Discharge status: Home / Self Care | Payer: Medicare Other | Source: Ambulatory Visit | Attending: Radiation Oncology | Admitting: Radiation Oncology

## 2016-06-01 ENCOUNTER — Ambulatory Visit: Payer: Medicare Other

## 2016-06-01 DIAGNOSIS — Z510 Encounter for antineoplastic radiation therapy: Secondary | ICD-10-CM | POA: Diagnosis not present

## 2016-06-02 ENCOUNTER — Ambulatory Visit
Admission: RE | Admit: 2016-06-02 | Discharge: 2016-06-02 | Disposition: A | Discharge status: Home / Self Care | Payer: Medicare Other | Source: Ambulatory Visit | Attending: Radiation Oncology | Admitting: Radiation Oncology

## 2016-06-02 ENCOUNTER — Other Ambulatory Visit: Payer: Self-pay | Admitting: Medical Oncology

## 2016-06-02 ENCOUNTER — Encounter: Payer: Self-pay | Admitting: Radiation Oncology

## 2016-06-02 ENCOUNTER — Ambulatory Visit: Payer: Medicare Other

## 2016-06-02 VITALS — BP 178/75 | HR 88 | Resp 16 | Wt 123.1 lb

## 2016-06-02 DIAGNOSIS — C3492 Malignant neoplasm of unspecified part of left bronchus or lung: Secondary | ICD-10-CM

## 2016-06-02 DIAGNOSIS — Z510 Encounter for antineoplastic radiation therapy: Secondary | ICD-10-CM | POA: Diagnosis not present

## 2016-06-02 NOTE — Progress Notes (Signed)
Weight and vitals stable. Denies pain. Reports mild cough and shortness of breath with exertion. Denies hemoptysis. Reports difficulty swallowing continues but, has been manageable with carafate. Reports fatigue. Denies skin changes within treatment field. Reports using radiaplex bid as directed. Hoarseness continues.   BP 178/75 mmHg  Pulse 88  Resp 16  Wt 123 lb 1.6 oz (55.838 kg)  SpO2 100% Wt Readings from Last 3 Encounters:  06/02/16 123 lb 1.6 oz (55.838 kg)  05/30/16 122 lb 3.2 oz (55.43 kg)  05/27/16 123 lb 11.2 oz (56.11 kg)

## 2016-06-02 NOTE — Progress Notes (Signed)
  Radiation Oncology         [phone]   Name: Carol [name] MRN: [id number]   Date: 06/02/2016  DOB: 10/30/1932   Weekly Radiation Therapy Management    ICD-9-CM ICD-10-CM   1. Non-small cell cancer of left lung (H[facility]) 162.9 C34.92     Current Dose: 36 Gy  Planned Dose:  66 Gy  Narrative The patient presents for routine under treatment assessment.  Denies pain. Reports mild cough and shortness of breath with exertion. Denies hemoptysis. Reports difficulty swallowing continues, but has been manageable with carafate. Reports fatigue. Denies skin changes within the treatment field. Reports using radiaplex bid as directed. Hoarseness has improved. .   The patient is without complaint. Set-up films were reviewed. The chart was checked.  Physical Findings  weight is 123 lb 1.6 oz (55.838 kg). Her blood pressure is 178/75 and her pulse is 88. Her respiration is 16 and oxygen saturation is 100%. . Weight essentially stable.  No significant changes. The patient is ambulatory with a wheelchair. The patient reports that she is having some pain in the throat and swallowing trouble due to treatment and reports that the pain is worse during the beginning of the week.   Impression The patient is tolerating radiation.  Plan Continue treatment as planned.      Sheral Apley Tammi Klippel, M.D.  This document serves as a record of services personally performed by Tyler Pita, MD. It was created on his behalf by Truddie Hidden, a trained medical scribe. The creation of this record is based on the scribe's personal observations and the provider's statements to them. This document has been checked and approved by the attending provider.

## 2016-06-03 ENCOUNTER — Ambulatory Visit: Payer: Medicare Other

## 2016-06-03 ENCOUNTER — Ambulatory Visit
Admission: RE | Admit: 2016-06-03 | Discharge: 2016-06-03 | Disposition: A | Discharge status: Home / Self Care | Payer: Medicare Other | Source: Ambulatory Visit | Attending: Radiation Oncology | Admitting: Radiation Oncology

## 2016-06-03 DIAGNOSIS — Z510 Encounter for antineoplastic radiation therapy: Secondary | ICD-10-CM | POA: Diagnosis not present

## 2016-06-06 ENCOUNTER — Telehealth: Payer: Self-pay | Admitting: *Deleted

## 2016-06-06 ENCOUNTER — Ambulatory Visit
Admission: RE | Admit: 2016-06-06 | Discharge: 2016-06-06 | Disposition: A | Discharge status: Home / Self Care | Payer: Medicare Other | Source: Ambulatory Visit | Attending: Radiation Oncology | Admitting: Radiation Oncology

## 2016-06-06 ENCOUNTER — Other Ambulatory Visit (HOSPITAL_BASED_OUTPATIENT_CLINIC_OR_DEPARTMENT_OTHER): Payer: Medicare Other

## 2016-06-06 ENCOUNTER — Ambulatory Visit (HOSPITAL_BASED_OUTPATIENT_CLINIC_OR_DEPARTMENT_OTHER): Payer: Medicare Other

## 2016-06-06 ENCOUNTER — Other Ambulatory Visit: Payer: Self-pay | Admitting: Radiation Oncology

## 2016-06-06 ENCOUNTER — Telehealth: Payer: Self-pay | Admitting: Radiation Oncology

## 2016-06-06 ENCOUNTER — Ambulatory Visit: Payer: Medicare Other

## 2016-06-06 VITALS — BP 159/75 | HR 91 | Temp 98.2°F | Resp 16

## 2016-06-06 DIAGNOSIS — C3492 Malignant neoplasm of unspecified part of left bronchus or lung: Secondary | ICD-10-CM

## 2016-06-06 DIAGNOSIS — C3432 Malignant neoplasm of lower lobe, left bronchus or lung: Secondary | ICD-10-CM

## 2016-06-06 DIAGNOSIS — Z510 Encounter for antineoplastic radiation therapy: Secondary | ICD-10-CM | POA: Diagnosis not present

## 2016-06-06 DIAGNOSIS — Z5111 Encounter for antineoplastic chemotherapy: Secondary | ICD-10-CM

## 2016-06-06 LAB — COMPREHENSIVE METABOLIC PANEL
ALK PHOS: 105 U/L (ref 40–150)
ALT: 20 U/L (ref 0–55)
AST: 14 U/L (ref 5–34)
Albumin: 2.8 g/dL — ABNORMAL LOW (ref 3.5–5.0)
Anion Gap: 13 mEq/L — ABNORMAL HIGH (ref 3–11)
BUN: 10.4 mg/dL (ref 7.0–26.0)
CHLORIDE: 101 meq/L (ref 98–109)
CO2: 23 meq/L (ref 22–29)
Calcium: 8.4 mg/dL (ref 8.4–10.4)
Creatinine: 0.8 mg/dL (ref 0.6–1.1)
EGFR: 72 mL/min/{1.73_m2} — AB (ref >90)
GLUCOSE: 181 mg/dL — AB (ref 70–140)
POTASSIUM: 3 meq/L — AB (ref 3.5–5.1)
SODIUM: 138 meq/L (ref 136–145)
Total Bilirubin: 0.4 mg/dL (ref 0.20–1.20)
Total Protein: 6.4 g/dL (ref 6.4–8.3)

## 2016-06-06 LAB — CBC WITH DIFFERENTIAL/PLATELET
BASO%: 0.9 % (ref 0.0–2.0)
Basophils Absolute: 0 10*3/uL (ref 0.0–0.1)
EOS%: 1.8 % (ref 0.0–7.0)
Eosinophils Absolute: 0.1 10*3/uL (ref 0.0–0.5)
HCT: 42 % (ref 34.8–46.6)
HGB: 13.7 g/dL (ref 11.6–15.9)
LYMPH%: 14.6 % (ref 14.0–49.7)
MCH: 29 pg (ref 25.1–34.0)
MCHC: 32.7 g/dL (ref 31.5–36.0)
MCV: 88.5 fL (ref 79.5–101.0)
MONO#: 0.5 10*3/uL (ref 0.1–0.9)
MONO%: 11.6 % (ref 0.0–14.0)
NEUT#: 2.7 10*3/uL (ref 1.5–6.5)
NEUT%: 71.1 % (ref 38.4–76.8)
Platelets: 178 10*3/uL (ref 145–400)
RBC: 4.74 10*6/uL (ref 3.70–5.45)
RDW: 15.9 % — ABNORMAL HIGH (ref 11.2–14.5)
WBC: 3.9 10*3/uL (ref 3.9–10.3)
lymph#: 0.6 10*3/uL — ABNORMAL LOW (ref 0.9–3.3)

## 2016-06-06 MED ORDER — SODIUM CHLORIDE 0.9 % IV SOLN
Freq: Once | INTRAVENOUS | Status: AC
  Administered 2016-06-06: 10:00:00 via INTRAVENOUS

## 2016-06-06 MED ORDER — DIPHENHYDRAMINE HCL 50 MG/ML IJ SOLN
50.0000 mg | Freq: Once | INTRAMUSCULAR | Status: AC
  Administered 2016-06-06: 50 mg via INTRAVENOUS

## 2016-06-06 MED ORDER — SODIUM CHLORIDE 0.9 % IV SOLN
125.6000 mg | Freq: Once | INTRAVENOUS | Status: AC
  Administered 2016-06-06: 130 mg via INTRAVENOUS
  Filled 2016-06-06: qty 13

## 2016-06-06 MED ORDER — DIPHENHYDRAMINE HCL 50 MG/ML IJ SOLN
INTRAMUSCULAR | Status: AC
  Filled 2016-06-06: qty 1

## 2016-06-06 MED ORDER — HYDROCODONE-ACETAMINOPHEN 7.5-325 MG/15ML PO SOLN: 15.0000 mL | Freq: Four times a day (QID) | ORAL | Status: DC | PRN

## 2016-06-06 MED ORDER — PALONOSETRON HCL INJECTION 0.25 MG/5ML
0.2500 mg | Freq: Once | INTRAVENOUS | Status: AC
  Administered 2016-06-06: 0.25 mg via INTRAVENOUS

## 2016-06-06 MED ORDER — FAMOTIDINE IN NACL 20-0.9 MG/50ML-% IV SOLN
INTRAVENOUS | Status: AC
  Filled 2016-06-06: qty 50

## 2016-06-06 MED ORDER — POTASSIUM CHLORIDE CRYS ER 20 MEQ PO TBCR: 20.0000 meq | EXTENDED_RELEASE_TABLET | Freq: Every day | ORAL | Status: DC

## 2016-06-06 MED ORDER — FAMOTIDINE IN NACL 20-0.9 MG/50ML-% IV SOLN
20.0000 mg | Freq: Once | INTRAVENOUS | Status: AC
  Administered 2016-06-06: 20 mg via INTRAVENOUS

## 2016-06-06 MED ORDER — PALONOSETRON HCL INJECTION 0.25 MG/5ML
INTRAVENOUS | Status: AC
  Filled 2016-06-06: qty 5

## 2016-06-06 MED ORDER — SODIUM CHLORIDE 0.9 % IV SOLN
20.0000 mg | Freq: Once | INTRAVENOUS | Status: AC
  Administered 2016-06-06: 20 mg via INTRAVENOUS
  Filled 2016-06-06: qty 2

## 2016-06-06 MED ORDER — SODIUM CHLORIDE 0.9 % IV SOLN
45.0000 mg/m2 | Freq: Once | INTRAVENOUS | Status: AC
  Administered 2016-06-06: 72 mg via INTRAVENOUS
  Filled 2016-06-06: qty 12

## 2016-06-06 NOTE — Telephone Encounter (Signed)
Called patient with the result and ordered K Dur 20 meq po qd X 10 days. Unable to reach pt lmovm

## 2016-06-06 NOTE — Progress Notes (Signed)
Quick Note:  Call patient with the result and order K Dur 20 meq po qd X 10 days ______ 

## 2016-06-06 NOTE — Telephone Encounter (Signed)
Phoned patient's daughter explaining her mother's hycet script is ready for pick up in the rad onc nursing area. She verbalized understanding.

## 2016-06-06 NOTE — Telephone Encounter (Signed)
-----   Message from Curt Bears, MD sent at 06/06/2016  9:40 AM EDT ----- Call patient with the result and order K Dur 20 meq po qd X 10 days

## 2016-06-06 NOTE — Patient Instructions (Signed)
[facility] Discharge Instructions for Patients Receiving Chemotherapy  Today you received the following chemotherapy agents Taxol and Carboplatin. To help prevent nausea and vomiting after your treatment, we encourage you to take your nausea medication as directed.  If you develop nausea and vomiting that is not controlled by your nausea medication, call the clinic.   BELOW ARE SYMPTOMS THAT SHOULD BE REPORTED IMMEDIATELY:  *FEVER GREATER THAN 100.5 F  *CHILLS WITH OR WITHOUT FEVER  NAUSEA AND VOMITING THAT IS NOT CONTROLLED WITH YOUR NAUSEA MEDICATION  *UNUSUAL SHORTNESS OF BREATH  *UNUSUAL BRUISING OR BLEEDING  TENDERNESS IN MOUTH AND THROAT WITH OR WITHOUT PRESENCE OF ULCERS  *URINARY PROBLEMS  *BOWEL PROBLEMS  UNUSUAL RASH Items with * indicate a potential emergency and should be followed up as soon as possible.  Feel free to call the clinic you have any questions or concerns. The clinic phone number is [phone].  Please show the CHEMO ALERT CARD at check-in to the Emergency Department and triage nurse.    

## 2016-06-08 ENCOUNTER — Ambulatory Visit: Payer: Medicare Other

## 2016-06-08 ENCOUNTER — Ambulatory Visit
Admission: RE | Admit: 2016-06-08 | Discharge: 2016-06-08 | Disposition: A | Discharge status: Home / Self Care | Payer: Medicare Other | Source: Ambulatory Visit | Attending: Radiation Oncology | Admitting: Radiation Oncology

## 2016-06-08 DIAGNOSIS — Z510 Encounter for antineoplastic radiation therapy: Secondary | ICD-10-CM | POA: Diagnosis not present

## 2016-06-09 ENCOUNTER — Ambulatory Visit: Payer: Medicare Other

## 2016-06-09 ENCOUNTER — Ambulatory Visit
Admission: RE | Admit: 2016-06-09 | Discharge: 2016-06-09 | Disposition: A | Discharge status: Home / Self Care | Payer: Medicare Other | Source: Ambulatory Visit | Attending: Radiation Oncology | Admitting: Radiation Oncology

## 2016-06-09 DIAGNOSIS — Z510 Encounter for antineoplastic radiation therapy: Secondary | ICD-10-CM | POA: Diagnosis not present

## 2016-06-10 ENCOUNTER — Ambulatory Visit
Admission: RE | Admit: 2016-06-10 | Discharge: 2016-06-10 | Disposition: A | Discharge status: Home / Self Care | Payer: Medicare Other | Source: Ambulatory Visit | Attending: Radiation Oncology | Admitting: Radiation Oncology

## 2016-06-10 ENCOUNTER — Ambulatory Visit: Payer: Medicare Other

## 2016-06-10 ENCOUNTER — Other Ambulatory Visit: Payer: Self-pay | Admitting: *Deleted

## 2016-06-10 ENCOUNTER — Encounter: Payer: Self-pay | Admitting: Radiation Oncology

## 2016-06-10 VITALS — BP 153/73 | HR 76 | Resp 16 | Wt 121.0 lb

## 2016-06-10 DIAGNOSIS — C3492 Malignant neoplasm of unspecified part of left bronchus or lung: Secondary | ICD-10-CM

## 2016-06-10 DIAGNOSIS — Z510 Encounter for antineoplastic radiation therapy: Secondary | ICD-10-CM | POA: Diagnosis not present

## 2016-06-10 NOTE — Progress Notes (Signed)
Weight and vitals stable. Denies pain. Reports mild cough and shortness of breath with exertion. Denies hemoptysis. Mild nose bleed reported this morning. Reports difficulty swallowing continues but, has been manageable with carafate and hycet. Reports fatigue. Denies skin changes within treatment field. Reports using radiaplex bid as directed.  BP 153/73 mmHg  Pulse 76  Resp 16  Wt 121 lb (54.885 kg)  SpO2 94% Wt Readings from Last 3 Encounters:  06/10/16 121 lb (54.885 kg)  06/02/16 123 lb 1.6 oz (55.838 kg)  05/30/16 122 lb 3.2 oz (55.43 kg)

## 2016-06-10 NOTE — Progress Notes (Signed)
Department of Radiation Oncology  Phone:  [phone] Fax:        [phone]  Weekly Treatment Note    Name: Carol [name] Date: 06/10/2016 MRN: [id number] DOB: [birth year]   Diagnosis:     ICD-9-CM ICD-10-CM   1. Non-small cell cancer of left lung ([place]) 162.9 C34.92      Current dose: 46 Gy  Current fraction: 23   MEDICATIONS: Current Outpatient Prescriptions  Medication Sig Dispense Refill  . albuterol (PROVENTIL HFA) 108 (90 Base) MCG/ACT inhaler Inhale 2 puffs into the lungs 2 (two) times daily as needed for shortness of breath. 8 g 5  . amLODipine (NORVASC) 5 MG tablet Take 1 tablet (5 mg total) by mouth daily. 30 tablet 5  . aspirin EC 81 MG tablet Take 81 mg by mouth daily.    Marland Kitchen atorvastatin (LIPITOR) 10 MG tablet Take 1 tablet (10 mg total) by mouth daily at 6 PM. Reported on 01/29/2016 30 tablet 11  . HYDROcodone-acetaminophen (HYCET) 7.5-325 mg/15 ml solution Take 15 mLs by mouth 4 (four) times daily as needed for moderate pain. 120 mL 0  . omeprazole (PRILOSEC) 40 MG capsule Take 1 capsule (40 mg total) by mouth daily. 90 capsule 3  . potassium chloride SA (K-DUR,KLOR-CON) 20 MEQ tablet Take 1 tablet (20 mEq total) by mouth daily. 10 tablet 0  . Sennosides (EX-LAX) 15 MG TABS Take 15 mg by mouth daily as needed (For constipation.).     Marland Kitchen sucralfate (CARAFATE) 1 g tablet Take 1 tablet (1 g total) by mouth 4 (four) times daily -  with meals and at bedtime. 120 tablet 1  . SYMBICORT 80-4.5 MCG/ACT inhaler Inhale 2 puffs into the lungs 2 (two) times daily as needed (FOR SOB). (Patient taking differently: Inhale 2 puffs into the lungs 2 times daily at 12 noon and 4 pm. ) 1 Inhaler 11  . LORazepam (ATIVAN) 0.5 MG tablet Take 1 tablet 73mns prior to MRI. Can take an additional tablet right before test if needed. (Patient not taking: Reported on 05/30/2016) 2 tablet 0  . prochlorperazine (COMPAZINE) 10 MG tablet Take 1 tablet (10 mg total) by mouth every 6 (six) hours as  needed for nausea or vomiting. (Patient not taking: Reported on 05/30/2016) 30 tablet 0   No current facility-administered medications for this encounter.     ALLERGIES: Review of patient's allergies indicates no known allergies.   LABORATORY DATA:  Lab Results  Component Value Date   WBC 3.9 06/06/2016   HGB 13.7 06/06/2016   HCT 42.0 06/06/2016   MCV 88.5 06/06/2016   PLT 178 06/06/2016   Lab Results  Component Value Date   NA 138 06/06/2016   K 3.0* 06/06/2016   CL 99 02/10/2016   CO2 23 06/06/2016   Lab Results  Component Value Date   ALT 20 06/06/2016   AST 14 06/06/2016   ALKPHOS 105 06/06/2016   BILITOT 0.40 06/06/2016     NARRATIVE: PAlison Kubickiwas seen today for weekly treatment management. The chart was checked and the patient's films were reviewed.  Weight and vitals stable. Denies pain. Reports mild cough and shortness of breath with exertion. Denies hemoptysis. Mild nose bleed reported this morning, attributed to dryness. This was the only episode of bloody nose. Reports difficulty swallowing continues but, has been manageable with carafate and hycet. Reports fatigue. Denies skin changes within treatment field. Reports using radiaplex bid as directed. Reports her breathing is better, and much better at night.  PHYSICAL EXAMINATION: weight is 121 lb (54.885 kg). Her blood pressure is 153/73 and her pulse is 76. Her respiration is 16 and oxygen saturation is 94%.     ASSESSMENT: The patient is doing satisfactorily with treatment.  PLAN: We will continue with the patient's radiation treatment as planned.  ------------------------------------------------  Jodelle Gross, MD, PhD  This document serves as a record of services personally performed by Kyung Rudd, MD. It was created on his behalf by Arlyce Harman, a trained medical scribe. The creation of this record is based on the scribe's personal observations and the provider's statements to them. This  document has been checked and approved by the attending provider.

## 2016-06-13 ENCOUNTER — Ambulatory Visit (HOSPITAL_BASED_OUTPATIENT_CLINIC_OR_DEPARTMENT_OTHER): Payer: Medicare Other | Admitting: Nurse Practitioner

## 2016-06-13 ENCOUNTER — Other Ambulatory Visit (HOSPITAL_BASED_OUTPATIENT_CLINIC_OR_DEPARTMENT_OTHER): Payer: Medicare Other

## 2016-06-13 ENCOUNTER — Ambulatory Visit (HOSPITAL_BASED_OUTPATIENT_CLINIC_OR_DEPARTMENT_OTHER): Payer: Medicare Other

## 2016-06-13 ENCOUNTER — Telehealth: Payer: Self-pay | Admitting: Internal Medicine

## 2016-06-13 ENCOUNTER — Ambulatory Visit: Payer: Medicare Other

## 2016-06-13 ENCOUNTER — Ambulatory Visit
Admission: RE | Admit: 2016-06-13 | Discharge: 2016-06-13 | Disposition: A | Discharge status: Home / Self Care | Payer: Medicare Other | Source: Ambulatory Visit | Attending: Radiation Oncology | Admitting: Radiation Oncology

## 2016-06-13 VITALS — BP 151/65 | HR 89 | Temp 97.9°F | Resp 17 | Ht 64.0 in | Wt 121.0 lb

## 2016-06-13 DIAGNOSIS — C3492 Malignant neoplasm of unspecified part of left bronchus or lung: Secondary | ICD-10-CM

## 2016-06-13 DIAGNOSIS — C3432 Malignant neoplasm of lower lobe, left bronchus or lung: Secondary | ICD-10-CM

## 2016-06-13 DIAGNOSIS — Z510 Encounter for antineoplastic radiation therapy: Secondary | ICD-10-CM | POA: Diagnosis not present

## 2016-06-13 LAB — CBC WITH DIFFERENTIAL/PLATELET
BASO%: 0.6 % (ref 0.0–2.0)
Basophils Absolute: 0 10*3/uL (ref 0.0–0.1)
EOS%: 2.6 % (ref 0.0–7.0)
Eosinophils Absolute: 0.1 10*3/uL (ref 0.0–0.5)
HEMATOCRIT: 38.7 % (ref 34.8–46.6)
HEMOGLOBIN: 13.1 g/dL (ref 11.6–15.9)
LYMPH%: 28.5 % (ref 14.0–49.7)
MCH: 29.7 pg (ref 25.1–34.0)
MCHC: 33.9 g/dL (ref 31.5–36.0)
MCV: 87.8 fL (ref 79.5–101.0)
MONO#: 0.4 10*3/uL (ref 0.1–0.9)
MONO%: 12.2 % (ref 0.0–14.0)
NEUT%: 56.1 % (ref 38.4–76.8)
NEUTROS ABS: 1.9 10*3/uL (ref 1.5–6.5)
PLATELETS: 208 10*3/uL (ref 145–400)
RBC: 4.41 10*6/uL (ref 3.70–5.45)
RDW: 16.1 % — ABNORMAL HIGH (ref 11.2–14.5)
WBC: 3.4 10*3/uL — AB (ref 3.9–10.3)
lymph#: 1 10*3/uL (ref 0.9–3.3)

## 2016-06-13 LAB — COMPREHENSIVE METABOLIC PANEL
ALBUMIN: 2.8 g/dL — AB (ref 3.5–5.0)
ALK PHOS: 96 U/L (ref 40–150)
ALT: 20 U/L (ref 0–55)
AST: 18 U/L (ref 5–34)
Anion Gap: 13 mEq/L — ABNORMAL HIGH (ref 3–11)
BILIRUBIN TOTAL: 0.45 mg/dL (ref 0.20–1.20)
BUN: 10.4 mg/dL (ref 7.0–26.0)
CALCIUM: 8 mg/dL — AB (ref 8.4–10.4)
CO2: 23 mEq/L (ref 22–29)
Chloride: 104 mEq/L (ref 98–109)
Creatinine: 0.7 mg/dL (ref 0.6–1.1)
EGFR: 77 mL/min/{1.73_m2} — AB (ref >90)
GLUCOSE: 146 mg/dL — AB (ref 70–140)
POTASSIUM: 3.1 meq/L — AB (ref 3.5–5.1)
SODIUM: 139 meq/L (ref 136–145)
TOTAL PROTEIN: 6.4 g/dL (ref 6.4–8.3)

## 2016-06-13 MED ORDER — PALONOSETRON HCL INJECTION 0.25 MG/5ML
0.2500 mg | Freq: Once | INTRAVENOUS | Status: AC
  Administered 2016-06-13: 0.25 mg via INTRAVENOUS

## 2016-06-13 MED ORDER — SODIUM CHLORIDE 0.9 % IV SOLN
Freq: Once | INTRAVENOUS | Status: AC
  Administered 2016-06-13: 12:00:00 via INTRAVENOUS

## 2016-06-13 MED ORDER — FAMOTIDINE IN NACL 20-0.9 MG/50ML-% IV SOLN
INTRAVENOUS | Status: AC
  Filled 2016-06-13: qty 50

## 2016-06-13 MED ORDER — DIPHENHYDRAMINE HCL 50 MG/ML IJ SOLN
50.0000 mg | Freq: Once | INTRAMUSCULAR | Status: AC
  Administered 2016-06-13: 50 mg via INTRAVENOUS

## 2016-06-13 MED ORDER — SODIUM CHLORIDE 0.9 % IV SOLN
130.0000 mg | Freq: Once | INTRAVENOUS | Status: AC
  Administered 2016-06-13: 130 mg via INTRAVENOUS
  Filled 2016-06-13: qty 13

## 2016-06-13 MED ORDER — PALONOSETRON HCL INJECTION 0.25 MG/5ML
INTRAVENOUS | Status: AC
  Filled 2016-06-13: qty 5

## 2016-06-13 MED ORDER — SODIUM CHLORIDE 0.9 % IV SOLN
45.0000 mg/m2 | Freq: Once | INTRAVENOUS | Status: AC
  Administered 2016-06-13: 72 mg via INTRAVENOUS
  Filled 2016-06-13: qty 12

## 2016-06-13 MED ORDER — HYDROCODONE-ACETAMINOPHEN 7.5-325 MG/15ML PO SOLN: 15.0000 mL | Freq: Four times a day (QID) | ORAL | Status: DC | PRN

## 2016-06-13 MED ORDER — PROCHLORPERAZINE MALEATE 10 MG PO TABS
ORAL_TABLET | ORAL | Status: AC
  Filled 2016-06-13: qty 1

## 2016-06-13 MED ORDER — SODIUM CHLORIDE 0.9 % IV SOLN
20.0000 mg | Freq: Once | INTRAVENOUS | Status: AC
  Administered 2016-06-13: 20 mg via INTRAVENOUS
  Filled 2016-06-13: qty 2

## 2016-06-13 MED ORDER — FAMOTIDINE IN NACL 20-0.9 MG/50ML-% IV SOLN
20.0000 mg | Freq: Once | INTRAVENOUS | Status: AC
  Administered 2016-06-13: 20 mg via INTRAVENOUS

## 2016-06-13 MED ORDER — DIPHENHYDRAMINE HCL 50 MG/ML IJ SOLN
INTRAMUSCULAR | Status: AC
  Filled 2016-06-13: qty 1

## 2016-06-13 NOTE — Telephone Encounter (Signed)
Gave patient son in law avs report and appointments for July and August - 7/17 last date in tx plan and per son in law last day of chemo. Central radiology scheduling will call patient/family re ct - son in law aware.

## 2016-06-13 NOTE — Progress Notes (Addendum)
[facility] OFFICE PROGRESS NOTE   DIAGNOSIS: Stage IIIA (T2b, N2, M0) non-small cell lung cancer, squamous cell carcinoma, presented with large left lower lobe lung mass in addition to highly suspicious mediastinal lymphadenopathy. The patient could have a stage IV lung cancer if the hypothalamic lesion is consistent with metastatic disease.  PRIOR THERAPY: None.  CURRENT THERAPY: Concurrent chemoradiation with weekly carboplatin for AUC of 2 and paclitaxel 45 MG/M2. Status post 5 cycles.    INTERVAL HISTORY:   Carol [name] returns as scheduled. She completed cycle 5 weekly Taxol/carboplatin 06/06/2016. She continues radiation. She denies significant nausea. No mouth sores. No diarrhea. She has had some constipation. She plans on beginning Miralax. No numbness or tingling in her hands or feet. She has noted a mild increase in odynophagia. She is taking hydrocodone as needed. She is tolerating fluids at room temperature. She notes her breathing is better. No cough. No fever. She notes progressive fatigue.  Objective:  Vital signs in last 24 hours:  Blood pressure 151/65, pulse 89, temperature 97.9 F (36.6 C), temperature source Oral, resp. rate 17, height '5\' 4"'$  (1.626 m), weight 121 lb (54.885 kg), SpO2 94 %.    HEENT: No thrush or ulcers. Resp: Lungs clear bilaterally. Cardio: Regular rate and rhythm. GI: Abdomen soft and nontender. No hepatomegaly. Vascular: No leg edema. Neuro: Alert and oriented.    Lab Results:  Lab Results  Component Value Date   WBC 3.4* 06/13/2016   HGB 13.1 06/13/2016   HCT 38.7 06/13/2016   MCV 87.8 06/13/2016   PLT 208 06/13/2016   NEUTROABS 1.9 06/13/2016    Imaging:  No results found.  Medications: I have reviewed the patient's current medications.  Assessment/Plan: 1. Stage IIIa non-small cell lung cancer, squamous cell carcinoma currently undergoing concurrent chemoradiation with weekly carboplatin and paclitaxel is  status post 5 weeks of treatment. 2. Odynophagia secondary to radiation.   Disposition: Carol [name] appears stable. She has completed 5 cycles of weekly Taxol/carboplatin. She continues radiation. Plan to proceed with cycle 6 today as scheduled. She will return for the seventh and final cycle of chemotherapy in one week. She is scheduled to complete the course of radiation 06/24/2016. The plan is for a follow-up CT scan of the chest about 4 weeks after completing radiation. She will return for a follow-up visit one to 2 days after the CT scan to review the results with Dr. Julien Nordmann.  She has odynophagia related to radiation. At present she is tolerating fluids. She will continue hydrocodone as needed. She understands to contact us or radiation if she is unable to maintain adequate oral hydration.  Patient seen with Dr. Julien Nordmann.    Ned Card ANP/GNP-BC   06/13/2016  10:32 AM   ADDENDUM: Hematology/Oncology Attending:  I had a face to face encounter with the patient. I recommended her care plan. This is a very pleasant 82 years old white female with a stage IIIa non-small cell lung cancer. She is currently undergoing a course of concurrent chemoradiation with weekly carboplatin and paclitaxel is status post 5 cycles. She is expected to complete her treatment next week. I recommended for the patient to proceed with cycle #6 today as scheduled. I will see her back for follow-up visit in 6 weeks for evaluation after repeating CT scan of the chest for restaging of her disease. She was advised to call immediately if she has any concerning symptoms in the interval.  Disclaimer: This note was dictated with voice recognition  software. Similar sounding words can inadvertently be transcribed and may be missed upon review. Eilleen Kempf., MD 06/16/2016

## 2016-06-14 ENCOUNTER — Ambulatory Visit
Admission: RE | Admit: 2016-06-14 | Discharge: 2016-06-14 | Disposition: A | Discharge status: Home / Self Care | Payer: Medicare Other | Source: Ambulatory Visit | Attending: Radiation Oncology | Admitting: Radiation Oncology

## 2016-06-14 ENCOUNTER — Ambulatory Visit: Payer: Medicare Other

## 2016-06-14 DIAGNOSIS — Z510 Encounter for antineoplastic radiation therapy: Secondary | ICD-10-CM | POA: Diagnosis not present

## 2016-06-15 ENCOUNTER — Ambulatory Visit
Admission: RE | Admit: 2016-06-15 | Discharge: 2016-06-15 | Disposition: A | Discharge status: Home / Self Care | Payer: Medicare Other | Source: Ambulatory Visit | Attending: Radiation Oncology | Admitting: Radiation Oncology

## 2016-06-15 ENCOUNTER — Ambulatory Visit: Payer: Medicare Other

## 2016-06-15 DIAGNOSIS — Z510 Encounter for antineoplastic radiation therapy: Secondary | ICD-10-CM | POA: Diagnosis not present

## 2016-06-16 ENCOUNTER — Ambulatory Visit
Admission: RE | Admit: 2016-06-16 | Discharge: 2016-06-16 | Disposition: A | Discharge status: Home / Self Care | Payer: Medicare Other | Source: Ambulatory Visit | Attending: Radiation Oncology | Admitting: Radiation Oncology

## 2016-06-16 ENCOUNTER — Ambulatory Visit: Payer: Medicare Other

## 2016-06-16 ENCOUNTER — Encounter: Payer: Self-pay | Admitting: Nurse Practitioner

## 2016-06-16 ENCOUNTER — Encounter: Payer: Self-pay | Admitting: Radiation Oncology

## 2016-06-16 VITALS — BP 151/75 | HR 92 | Resp 16 | Wt 119.4 lb

## 2016-06-16 DIAGNOSIS — C3492 Malignant neoplasm of unspecified part of left bronchus or lung: Secondary | ICD-10-CM

## 2016-06-16 DIAGNOSIS — Z510 Encounter for antineoplastic radiation therapy: Secondary | ICD-10-CM | POA: Diagnosis not present

## 2016-06-16 NOTE — Progress Notes (Signed)
  Radiation Oncology         [phone]   Name: Carol [name] MRN: [id number]   Date: 06/16/2016  DOB: 09-17-1932   Weekly Radiation Therapy Management  Stage IIIA (T2b, N2, M0) non-small lung cancer, squamous cell carcinoma.    Current Dose: 54 Gy  Planned Dose:  66 Gy  Narrative The patient presents for routine under treatment assessment.  3 lb weight loss noted. Vitals stable. Reports mild cough and shortness of breath with exertion. Denies hemoptysis. Reports discomfort associated with swallowing continues but, has been manageable with carafate and hycet. However, patient reports new onset of abdominal/stomach pain following meal. Patient unable to describe or rate this pain or than to say "its just a wave of pain." Reports fatigue. Denies skin changes within treatment field. Reports using radiaplex bid as directed. Patient reports she has been constipated but is treating with Miralax. Patient is also taking Prilosec for irritation of stomach.  .   The patient is without complaint. Set-up films were reviewed. The chart was checked.  Physical Findings  weight is 119 lb 6.4 oz (54.159 kg). Her blood pressure is 151/75 and her pulse is 92. Her respiration is 16 and oxygen saturation is 97%. . Weight essentially stable.  No significant changes. The patient is ambulatory with a wheelchair. The patient reports that she is having some pain in the throat and swallowing trouble.    Impression The patient is tolerating radiation.  Plan Continue treatment as planned.      Sheral Apley Tammi Klippel, M.D.  This document serves as a record of services personally performed by Tyler Pita, MD. It was created on his behalf by Truddie Hidden, a trained medical scribe. The creation of this record is based on the scribe's personal observations and the provider's statements to them. This document has been checked and approved by the attending provider.

## 2016-06-16 NOTE — Progress Notes (Signed)
3 lb weight loss noted. Vitals stable. Reports mild cough and shortness of breath with exertion. Denies hemoptysis. Reports discomfort associated with swallowing continues but, has been manageable with carafate and hycet. However, patient reports new onset of abdominal/stomach pain following meal. Patient unable to describe or rate this pain or than to say "its just a wave of pain." Reports fatigue. Denies skin changes within treatment field. Reports using radiaplex bid as directed.  BP 151/75 mmHg  Pulse 92  Resp 16  Wt 119 lb 6.4 oz (54.159 kg)  SpO2 97% Wt Readings from Last 3 Encounters:  06/16/16 119 lb 6.4 oz (54.159 kg)  06/13/16 121 lb (54.885 kg)  06/10/16 121 lb (54.885 kg)

## 2016-06-17 ENCOUNTER — Ambulatory Visit
Admission: RE | Admit: 2016-06-17 | Discharge: 2016-06-17 | Disposition: A | Discharge status: Home / Self Care | Payer: Medicare Other | Source: Ambulatory Visit | Attending: Radiation Oncology | Admitting: Radiation Oncology

## 2016-06-17 ENCOUNTER — Ambulatory Visit: Payer: Medicare Other

## 2016-06-17 DIAGNOSIS — Z510 Encounter for antineoplastic radiation therapy: Secondary | ICD-10-CM | POA: Diagnosis not present

## 2016-06-20 ENCOUNTER — Other Ambulatory Visit: Payer: Self-pay | Admitting: *Deleted

## 2016-06-20 ENCOUNTER — Ambulatory Visit: Payer: Medicare Other

## 2016-06-20 ENCOUNTER — Other Ambulatory Visit (HOSPITAL_BASED_OUTPATIENT_CLINIC_OR_DEPARTMENT_OTHER): Payer: Medicare Other

## 2016-06-20 ENCOUNTER — Ambulatory Visit (HOSPITAL_BASED_OUTPATIENT_CLINIC_OR_DEPARTMENT_OTHER): Payer: Medicare Other

## 2016-06-20 ENCOUNTER — Ambulatory Visit
Admission: RE | Admit: 2016-06-20 | Discharge: 2016-06-20 | Disposition: A | Discharge status: Home / Self Care | Payer: Medicare Other | Source: Ambulatory Visit | Attending: Radiation Oncology | Admitting: Radiation Oncology

## 2016-06-20 VITALS — BP 157/72 | HR 84 | Temp 98.1°F | Resp 18

## 2016-06-20 DIAGNOSIS — C3432 Malignant neoplasm of lower lobe, left bronchus or lung: Secondary | ICD-10-CM | POA: Diagnosis present

## 2016-06-20 DIAGNOSIS — Z510 Encounter for antineoplastic radiation therapy: Secondary | ICD-10-CM | POA: Diagnosis not present

## 2016-06-20 DIAGNOSIS — Z5111 Encounter for antineoplastic chemotherapy: Secondary | ICD-10-CM

## 2016-06-20 DIAGNOSIS — C3492 Malignant neoplasm of unspecified part of left bronchus or lung: Secondary | ICD-10-CM

## 2016-06-20 LAB — COMPREHENSIVE METABOLIC PANEL
ALBUMIN: 3 g/dL — AB (ref 3.5–5.0)
ALK PHOS: 106 U/L (ref 40–150)
ALT: 26 U/L (ref 0–55)
AST: 19 U/L (ref 5–34)
Anion Gap: 13 mEq/L — ABNORMAL HIGH (ref 3–11)
BILIRUBIN TOTAL: 0.56 mg/dL (ref 0.20–1.20)
BUN: 8.3 mg/dL (ref 7.0–26.0)
CALCIUM: 8.6 mg/dL (ref 8.4–10.4)
CO2: 23 mEq/L (ref 22–29)
Chloride: 102 mEq/L (ref 98–109)
Creatinine: 0.6 mg/dL (ref 0.6–1.1)
EGFR: 83 mL/min/{1.73_m2} — AB (ref >90)
Glucose: 104 mg/dl (ref 70–140)
POTASSIUM: 3.1 meq/L — AB (ref 3.5–5.1)
Sodium: 139 mEq/L (ref 136–145)
TOTAL PROTEIN: 6.6 g/dL (ref 6.4–8.3)

## 2016-06-20 LAB — CBC WITH DIFFERENTIAL/PLATELET
BASO%: 0.8 % (ref 0.0–2.0)
BASOS ABS: 0 10*3/uL (ref 0.0–0.1)
EOS ABS: 0 10*3/uL (ref 0.0–0.5)
EOS%: 1.3 % (ref 0.0–7.0)
HEMATOCRIT: 38.9 % (ref 34.8–46.6)
HEMOGLOBIN: 13 g/dL (ref 11.6–15.9)
LYMPH#: 0.9 10*3/uL (ref 0.9–3.3)
LYMPH%: 28.3 % (ref 14.0–49.7)
MCH: 29.5 pg (ref 25.1–34.0)
MCHC: 33.5 g/dL (ref 31.5–36.0)
MCV: 88.2 fL (ref 79.5–101.0)
MONO#: 0.4 10*3/uL (ref 0.1–0.9)
MONO%: 13 % (ref 0.0–14.0)
NEUT%: 56.6 % (ref 38.4–76.8)
NEUTROS ABS: 1.7 10*3/uL (ref 1.5–6.5)
Platelets: 245 10*3/uL (ref 145–400)
RBC: 4.41 10*6/uL (ref 3.70–5.45)
RDW: 16.2 % — AB (ref 11.2–14.5)
WBC: 3 10*3/uL — ABNORMAL LOW (ref 3.9–10.3)

## 2016-06-20 MED ORDER — FAMOTIDINE IN NACL 20-0.9 MG/50ML-% IV SOLN
INTRAVENOUS | Status: AC
  Filled 2016-06-20: qty 50

## 2016-06-20 MED ORDER — PALONOSETRON HCL INJECTION 0.25 MG/5ML
0.2500 mg | Freq: Once | INTRAVENOUS | Status: AC
  Administered 2016-06-20: 0.25 mg via INTRAVENOUS

## 2016-06-20 MED ORDER — SODIUM CHLORIDE 0.9 % IV SOLN
45.0000 mg/m2 | Freq: Once | INTRAVENOUS | Status: AC
  Administered 2016-06-20: 72 mg via INTRAVENOUS
  Filled 2016-06-20: qty 12

## 2016-06-20 MED ORDER — DIPHENHYDRAMINE HCL 50 MG/ML IJ SOLN
50.0000 mg | Freq: Once | INTRAMUSCULAR | Status: AC
  Administered 2016-06-20: 50 mg via INTRAVENOUS

## 2016-06-20 MED ORDER — DIPHENHYDRAMINE HCL 50 MG/ML IJ SOLN
INTRAMUSCULAR | Status: AC
  Filled 2016-06-20: qty 1

## 2016-06-20 MED ORDER — SODIUM CHLORIDE 0.9 % IV SOLN
Freq: Once | INTRAVENOUS | Status: AC
  Administered 2016-06-20: 10:00:00 via INTRAVENOUS

## 2016-06-20 MED ORDER — SODIUM CHLORIDE 0.9 % IV SOLN
20.0000 mg | Freq: Once | INTRAVENOUS | Status: AC
  Administered 2016-06-20: 20 mg via INTRAVENOUS
  Filled 2016-06-20: qty 2

## 2016-06-20 MED ORDER — FAMOTIDINE IN NACL 20-0.9 MG/50ML-% IV SOLN
20.0000 mg | Freq: Once | INTRAVENOUS | Status: AC
  Administered 2016-06-20: 20 mg via INTRAVENOUS

## 2016-06-20 MED ORDER — PALONOSETRON HCL INJECTION 0.25 MG/5ML
INTRAVENOUS | Status: AC
  Filled 2016-06-20: qty 5

## 2016-06-20 MED ORDER — SODIUM CHLORIDE 0.9 % IV SOLN
125.6000 mg | Freq: Once | INTRAVENOUS | Status: AC
  Administered 2016-06-20: 130 mg via INTRAVENOUS
  Filled 2016-06-20: qty 13

## 2016-06-20 NOTE — Progress Notes (Signed)
Upon initial assessment, pt reporting new onset of mid abdominal pain.  Pt states this is a new pain that started after radiation treatment on Friday.  Pt reports she has pain with swallowing and this abdominal pain which is a constant discomfort that was about an 8 over the weekend.  Pt reports it is currently a 6.  Pt reports passing gas and having regular BMs.  Informed Dr. Julien Nordmann who states it is from radiation therapy.  Reviewed medications with pt which include liquid pain medication, carafate, and prilosec.  Pt verbalized understanding and confirmed she had enough of these medications to help control discomfort.  Pt without further questions or complaints at initiation of treatment.  Also discussed K+ 3.1 with Dr. Julien Nordmann, per MD no changes to pts oral K+ supplement regimen.  Pt reports taking 20 mEq K+ daily and verbalizes understanding to continue this.

## 2016-06-20 NOTE — Patient Instructions (Signed)
[facility] Discharge Instructions for Patients Receiving Chemotherapy  Today you received the following chemotherapy agents Taxol/Carboplatin.  To help prevent nausea and vomiting after your treatment, we encourage you to take your nausea medication as directed.   If you develop nausea and vomiting that is not controlled by your nausea medication, call the clinic.   BELOW ARE SYMPTOMS THAT SHOULD BE REPORTED IMMEDIATELY:  *FEVER GREATER THAN 100.5 F  *CHILLS WITH OR WITHOUT FEVER  NAUSEA AND VOMITING THAT IS NOT CONTROLLED WITH YOUR NAUSEA MEDICATION  *UNUSUAL SHORTNESS OF BREATH  *UNUSUAL BRUISING OR BLEEDING  TENDERNESS IN MOUTH AND THROAT WITH OR WITHOUT PRESENCE OF ULCERS  *URINARY PROBLEMS  *BOWEL PROBLEMS  UNUSUAL RASH Items with * indicate a potential emergency and should be followed up as soon as possible.  Feel free to call the clinic you have any questions or concerns. The clinic phone number is [phone].  Please show the [facility] at check-in to the Emergency Department and triage nurse.

## 2016-06-21 ENCOUNTER — Telehealth: Payer: Self-pay | Admitting: *Deleted

## 2016-06-21 ENCOUNTER — Other Ambulatory Visit: Payer: Self-pay | Admitting: *Deleted

## 2016-06-21 ENCOUNTER — Ambulatory Visit: Payer: Medicare Other

## 2016-06-21 ENCOUNTER — Telehealth: Payer: Self-pay | Admitting: Pulmonary Disease

## 2016-06-21 ENCOUNTER — Ambulatory Visit
Admission: RE | Admit: 2016-06-21 | Discharge: 2016-06-21 | Disposition: A | Discharge status: Home / Self Care | Payer: Medicare Other | Source: Ambulatory Visit | Attending: Radiation Oncology | Admitting: Radiation Oncology

## 2016-06-21 DIAGNOSIS — Z510 Encounter for antineoplastic radiation therapy: Secondary | ICD-10-CM | POA: Diagnosis not present

## 2016-06-21 DIAGNOSIS — C3492 Malignant neoplasm of unspecified part of left bronchus or lung: Secondary | ICD-10-CM

## 2016-06-21 MED ORDER — HYDROCODONE-ACETAMINOPHEN 7.5-325 MG/15ML PO SOLN: 15.0000 mL | Freq: Four times a day (QID) | ORAL | Status: DC | PRN

## 2016-06-21 NOTE — Telephone Encounter (Signed)
Call from [name] to refill Hycet Reviewed with MD, notified [name] ready for pick up

## 2016-06-21 NOTE — Telephone Encounter (Signed)
Notified [name] is ready for pick up

## 2016-06-21 NOTE — Telephone Encounter (Signed)
Spoke with [name]. States that pt needs a refill on Albuterol HFA. Advised him that pt's PCP refilled this in March with 5 additional refills. He will call back if there are any issues. Nothing further was needed.

## 2016-06-22 ENCOUNTER — Ambulatory Visit: Payer: Medicare Other

## 2016-06-22 ENCOUNTER — Ambulatory Visit
Admission: RE | Admit: 2016-06-22 | Discharge: 2016-06-22 | Disposition: A | Discharge status: Home / Self Care | Payer: Medicare Other | Source: Ambulatory Visit | Attending: Radiation Oncology | Admitting: Radiation Oncology

## 2016-06-22 DIAGNOSIS — Z510 Encounter for antineoplastic radiation therapy: Secondary | ICD-10-CM | POA: Diagnosis not present

## 2016-06-23 ENCOUNTER — Ambulatory Visit: Payer: Medicare Other

## 2016-06-23 ENCOUNTER — Ambulatory Visit
Admission: RE | Admit: 2016-06-23 | Discharge: 2016-06-23 | Disposition: A | Discharge status: Home / Self Care | Payer: Medicare Other | Source: Ambulatory Visit | Attending: Radiation Oncology | Admitting: Radiation Oncology

## 2016-06-23 DIAGNOSIS — Z510 Encounter for antineoplastic radiation therapy: Secondary | ICD-10-CM | POA: Diagnosis not present

## 2016-06-24 ENCOUNTER — Encounter: Payer: Self-pay | Admitting: Radiation Oncology

## 2016-06-24 ENCOUNTER — Ambulatory Visit
Admission: RE | Admit: 2016-06-24 | Discharge: 2016-06-24 | Disposition: A | Discharge status: Home / Self Care | Payer: Medicare Other | Source: Ambulatory Visit | Attending: Radiation Oncology | Admitting: Radiation Oncology

## 2016-06-24 VITALS — BP 168/75 | HR 83 | Resp 16 | Wt 118.7 lb

## 2016-06-24 DIAGNOSIS — Z510 Encounter for antineoplastic radiation therapy: Secondary | ICD-10-CM | POA: Diagnosis not present

## 2016-06-24 DIAGNOSIS — C3492 Malignant neoplasm of unspecified part of left bronchus or lung: Secondary | ICD-10-CM

## 2016-06-24 MED ORDER — RADIAPLEXRX EX GEL
Freq: Once | CUTANEOUS | Status: AC
  Administered 2016-06-24: 17:00:00 via TOPICAL

## 2016-06-24 NOTE — Progress Notes (Signed)
  Radiation Oncology         [phone]   Name: Carol [name] MRN: [id number]   Date: 06/24/2016  DOB: 1932/01/15   Weekly Radiation Therapy Management   Stage IIIA (T2b, N2, M0) non-small lung cancer, squamous cell carcinoma.    Current Dose: 66 Gy  Planned Dose:  66 Gy  Narrative The patient presents for routine under treatment assessment.  Vitals and weight stable. Reports mild cough and shortness of breath with exertion. Denies hemoptysis. Reports discomfort associated with swallowing continues but, has been manageable with carafate and hycet. However, patient reports new onset of abdominal/stomach pain following meal. Patient unable to describe or rate this pain or than to say "its just a wave of pain." Reports fatigue. Denies skin changes within treatment field. Reports using radiaplex bid as directed. Patient report that it has been difficult to eat for the last 2-3 days. Patient repots that she has been trying to consume a lot of Ensure and Jello.  One month follow up appointment card given.   .   The patient is without complaint. Set-up films were reviewed. The chart was checked.  Physical Findings  weight is 118 lb 11.2 oz (53.842 kg). Her blood pressure is 168/75 and her pulse is 83. Her respiration is 16 and oxygen saturation is 95%. . Weight essentially stable.  No significant changes. The patient is ambulatory with a wheelchair. The patient reports that she is having some pain in the throat and swallowing trouble.    Impression The patient is tolerating radiation.  Plan The patient's last radiotherapy treatment is today.  The patient will complete a routine follow up in approximately one month.        Sheral Apley Tammi Klippel, M.D.  This document serves as a record of services personally performed by Tyler Pita, MD. It was created on his behalf by Truddie Hidden, a trained medical scribe. The creation of this record is based on the scribe's personal observations and the  provider's statements to them. This document has been checked and approved by the attending provider.

## 2016-06-24 NOTE — Addendum Note (Signed)
Encounter addended by: Heywood Footman, RN on: 06/24/2016  4:45 PM<BR>     Documentation filed: Dx Association, Inpatient MAR, Orders

## 2016-06-24 NOTE — Progress Notes (Signed)
Vitals and weight stable. Reports mild cough and shortness of breath with exertion. Denies hemoptysis. Reports discomfort associated with swallowing continues but, has been manageable with carafate and hycet. However, patient reports new onset of abdominal/stomach pain following meal. Patient unable to describe or rate this pain or than to say "its just a wave of pain." Reports fatigue. Denies skin changes within treatment field. Reports using radiaplex bid as directed. One month follow up appointment card given.   BP 168/75 mmHg  Pulse 83  Resp 16  Wt 118 lb 11.2 oz (53.842 kg)  SpO2 95% Wt Readings from Last 3 Encounters:  06/24/16 118 lb 11.2 oz (53.842 kg)  06/16/16 119 lb 6.4 oz (54.159 kg)  06/13/16 121 lb (54.885 kg)

## 2016-06-29 ENCOUNTER — Other Ambulatory Visit: Payer: Self-pay | Admitting: Internal Medicine

## 2016-06-29 ENCOUNTER — Telehealth: Payer: Self-pay | Admitting: Internal Medicine

## 2016-06-29 DIAGNOSIS — C3492 Malignant neoplasm of unspecified part of left bronchus or lung: Secondary | ICD-10-CM

## 2016-06-29 MED ORDER — HYDROCODONE-ACETAMINOPHEN 7.5-325 MG/15ML PO SOLN: 15.0000 mL | Freq: Four times a day (QID) | ORAL | 0 refills | Status: DC | PRN

## 2016-06-29 NOTE — Telephone Encounter (Signed)
Appointment confirmed with [name] ([facility] [place]) Merleen Nicely.

## 2016-06-29 NOTE — Telephone Encounter (Signed)
Called pt spoke with [name] advised  Rx for Hycet will be ready to pick up 7/27.

## 2016-06-30 NOTE — Progress Notes (Signed)
  Radiation Oncology         [phone] ________________________________  Name: Carol [name] MRN: [id number]  Date: 06/24/2016  DOB: 07-02-1932  End of Treatment Note   ICD-9-CM ICD-10-CM    1. Non-small cell cancer of left lung (HCC) 162.9 C34.92     DIAGNOSIS:  82 yo woman with clinical stage IIIA (T2b, N2, M0) non-small cell lung cancer.  Indication for treatment:  Curative       Radiation treatment dates:   05/10/2016-06/24/2016  Site/dose:   66 Gy in 33 fractions to the left lung cancer  Beams/energy:   6 MV, helical Tomo IMRT  Narrative: The patient tolerated radiation treatment relatively well.   Reported mild cough and shortness of breath with exertion. Denied hemoptysis. Reported discomfort associated with swallowing continued but, had been manageable with carafate and hycet. Patient reported new onset of abdominal/stomach pain following meal during last week of treatment.  Plan: The patient has completed radiation treatment. The patient will return to radiation oncology clinic for routine followup in one month. I advised her to call or return sooner if she has any questions or concerns related to her recovery or treatment. ________________________________  Sheral Apley. Tammi Klippel, M.D.

## 2016-07-04 ENCOUNTER — Telehealth: Payer: Self-pay | Admitting: Gastroenterology

## 2016-07-04 ENCOUNTER — Ambulatory Visit: Payer: Medicare Other | Admitting: Pulmonary Disease

## 2016-07-05 ENCOUNTER — Other Ambulatory Visit: Payer: Self-pay | Admitting: *Deleted

## 2016-07-05 MED ORDER — AMLODIPINE BESYLATE 5 MG PO TABS: 5.0000 mg | ORAL_TABLET | Freq: Every day | ORAL | 0 refills | Status: AC

## 2016-07-05 NOTE — Telephone Encounter (Signed)
Amlodipine refilled.

## 2016-07-06 NOTE — Telephone Encounter (Signed)
Left message on machine to call back  

## 2016-07-11 ENCOUNTER — Telehealth: Payer: Self-pay | Admitting: Gastroenterology

## 2016-07-11 NOTE — Telephone Encounter (Signed)
See alternate note  

## 2016-07-11 NOTE — Telephone Encounter (Signed)
[name],Carol [phone]  [name] 27 minutes ago (8:11 AM)    Incoming call:  patient son in law stating that he is returning [name]'s call.

## 2016-07-12 ENCOUNTER — Other Ambulatory Visit: Payer: Self-pay | Admitting: Medical Oncology

## 2016-07-12 DIAGNOSIS — C3492 Malignant neoplasm of unspecified part of left bronchus or lung: Secondary | ICD-10-CM

## 2016-07-12 MED ORDER — HYDROCODONE-ACETAMINOPHEN 7.5-325 MG/15ML PO SOLN: 15.0000 mL | Freq: Four times a day (QID) | ORAL | 0 refills | Status: DC | PRN

## 2016-07-12 NOTE — Telephone Encounter (Signed)
Pt has had dysphagia of solids and liquids since radiation treatment, was told she had esophagitis and that was treated and has resolved.  She has began to have diarrhea 2 days ago and has lost 7 pounds this week.  (113 lb)  appt with Alen Blew on 07/13/16.

## 2016-07-13 ENCOUNTER — Ambulatory Visit (INDEPENDENT_AMBULATORY_CARE_PROVIDER_SITE_OTHER): Payer: Medicare Other | Admitting: Physician Assistant

## 2016-07-13 ENCOUNTER — Encounter: Payer: Self-pay | Admitting: Physician Assistant

## 2016-07-13 VITALS — BP 116/70 | HR 86 | Ht 64.0 in | Wt 113.2 lb

## 2016-07-13 DIAGNOSIS — R1013 Epigastric pain: Secondary | ICD-10-CM | POA: Diagnosis not present

## 2016-07-13 DIAGNOSIS — K219 Gastro-esophageal reflux disease without esophagitis: Secondary | ICD-10-CM

## 2016-07-13 MED ORDER — OMEPRAZOLE 40 MG PO CPDR: 40.0000 mg | DELAYED_RELEASE_CAPSULE | Freq: Two times a day (BID) | ORAL | 2 refills | Status: AC

## 2016-07-13 NOTE — Patient Instructions (Signed)
Increase your omeprazole 40 mg to one tablet by mouth twice daily. A new prescription has been sent to your pharmacy.

## 2016-07-13 NOTE — Progress Notes (Signed)
I agree with the above note, plan 

## 2016-07-13 NOTE — Progress Notes (Signed)
Chief Complaint: Epigastric pain  HPI:  Carol [name] is an 82 year old Caucasian female who has followed with Dr. Ardis Hughs in the past, with past medical history significant for recent diagnosis of squamous cell carcinoma of the left lower lung, status post radiation and chemotherapy within the past few months, COPD, GERD, hiatal hernia, esophageal stricture and hypertension, who presents to clinic today with a complaint of epigastric pain.  Recent labs completed 06/20/16 show a CMP with minimally decreased potassium at 3.1, albumin at 3.0 and a CBC with white cell count decreased to 3.0 and was otherwise normal.  Independent review of report and images from patient's last EGD performed on 02/29/16 by Dr. Ardis Hughs shows benign-appearing esophageal stenosis which was dilated to 18 mm, small hiatal hernia and an otherwise normal exam.  Today, the patient presents to clinic accompanied by her daughter and son-in-law, with a complaint of epigastric pain. Her son-in-law does disclose much of the history. They explain that recently the patient came down with some diarrhea and dry heaves/nausea, but this resolved a few days ago and only lasted for 3 days. The patient has continued with an epigastric pain. She describes that this pain is constant and "burning/sharp". This is exacerbated by eating anything. The patient denies a decrease in her appetite but is afraid to eat due to the pain which ensues 15-20 seconds after swallowing. This pain "hits her hard", and will then go away within 5-10 minutes, but leaves her with a lingering burning pain. This has been going on for over a week now and has led to some weight loss. She does continue her Omeprazole '40mg'$  qd. Associated symptoms include waves of nausea and some dry heaves.  Past medical history is positive for a recent diagnosis of esophagitis after radiation for which the patient was on liquid Vicodin and Carafate 3 times a day. The patient is no longer using  the Carafate as she did not feel that this helped. She does tell me that the symptoms of esophagitis and painful swallowing have resolved, but she has continued with this epigastric pain as above.  Patient denies fever, chills, blood in her stool, melena, anorexia, vomiting, heartburn, reflux, dysphagia or symptoms that awaken her at night.   Past Medical History:  Diagnosis Date  . Abdominal aortic aneurysm (AAA) ([place])   . Cataract   . COPD (chronic obstructive pulmonary disease) ([place])   . Coronary artery disease   . Encounter for antineoplastic chemotherapy 05/16/2016  . Fall   . GERD (gastroesophageal reflux disease)   . Hiatal hernia   . History of esophageal stricture   . Hypertension   . Lung cancer ([place])    squamous cell carcinoma LLL  . Osteoporosis   . Peripheral arterial disease [facility])     Past Surgical History:  Procedure Laterality Date  . CATARACT EXTRACTION    . COLONOSCOPY    . ENDOBRONCHIAL ULTRASOUND Bilateral 03/28/2016   Procedure: ENDOBRONCHIAL ULTRASOUND;  Surgeon: [name], MD;  Location: [facility] ENDOSCOPY;  Service: Cardiopulmonary;  Laterality: Bilateral;  dr. Ashok Cordia insists on mac not general anesthesia  . ESOPHAGOGASTRODUODENOSCOPY (EGD) WITH ESOPHAGEAL DILATION    . esopheal dilation  02-26-16  . EYE SURGERY      Current Outpatient Prescriptions  Medication Sig Dispense Refill  . albuterol (PROVENTIL HFA) 108 (90 Base) MCG/ACT inhaler Inhale 2 puffs into the lungs 2 (two) times daily as needed for shortness of breath. 8 g 5  . amLODipine (NORVASC) 5 MG tablet Take  1 tablet (5 mg total) by mouth daily. 90 tablet 0  . aspirin EC 81 MG tablet Take 81 mg by mouth daily.    Marland Kitchen atorvastatin (LIPITOR) 10 MG tablet Take 1 tablet (10 mg total) by mouth daily at 6 PM. Reported on 01/29/2016 30 tablet 11  . HYDROcodone-acetaminophen (HYCET) 7.5-325 mg/15 ml solution Take 15 mLs by mouth 4 (four) times daily as needed for moderate pain. 120 mL 0  . LORazepam  (ATIVAN) 0.5 MG tablet Take 1 tablet 33mns prior to MRI. Can take an additional tablet right before test if needed. 2 tablet 0  . omeprazole (PRILOSEC) 40 MG capsule Take 1 capsule (40 mg total) by mouth daily. 90 capsule 3  . potassium chloride SA (K-DUR,KLOR-CON) 20 MEQ tablet Take 1 tablet (20 mEq total) by mouth daily. 10 tablet 0  . prochlorperazine (COMPAZINE) 10 MG tablet Take 1 tablet (10 mg total) by mouth every 6 (six) hours as needed for nausea or vomiting. 30 tablet 0  . Sennosides (EX-LAX) 15 MG TABS Take 15 mg by mouth daily as needed (For constipation.).     .Marland Kitchensucralfate (CARAFATE) 1 g tablet Take 1 tablet (1 g total) by mouth 4 (four) times daily -  with meals and at bedtime. 120 tablet 1  . SYMBICORT 80-4.5 MCG/ACT inhaler Inhale 2 puffs into the lungs 2 (two) times daily as needed (FOR SOB). (Patient taking differently: Inhale 2 puffs into the lungs 2 times daily at 12 noon and 4 pm. ) 1 Inhaler 11   No current facility-administered medications for this visit.     Allergies as of 07/13/2016  . (No Known Allergies)    Family History  Problem Relation Age of Onset  . Breast cancer Mother 459 . Alcohol abuse Father   . Emphysema Father   . Hypertension Paternal Grandmother   . Early death Brother     MVA  . Rheumatologic disease Neg Hx     Social History   Social History  . Marital status: Divorced    Spouse name: N/A  . Number of children: 95 . Years of education: N/A   Occupational History  . Retired     OGlass blower/designer bClinical cytogeneticist  Social History Main Topics  . Smoking status: Former Smoker    Packs/day: 1.50    Years: 60.00    Types: Cigarettes    Quit date: 02/01/2016  . Smokeless tobacco: Never Used  . Alcohol use 0.0 oz/week     Comment: 1-2 glasses of wine daily  . Drug use: No  . Sexual activity: No   Other Topics Concern  . Not on file   Social History Narrative   Divor[place]. [place] lives with her  daughter and son-in-law.   HS diploma. Retired.    Former smoker: Quit February 2017, with at least 55-pack-year history.   Occasional glass of wine, no drug use.   Caffeinated beverages, takes a daily vitamin.   Wears her seatbelt. Has dentures.   Smoke detector in the home.   Feels safe in her relationships.      [facility]:   [place] [facility] [place] 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 No bird exposure. No mold exposure.     Review of Systems:    Constitutional: Positive for small  amount of weight loss over the past week No fever, chills or fatigue HEENT: Eyes: No change in vision               Ears, Nose, Throat:  No change in hearing or congestion Skin: No rash or itching Cardiovascular: No chest pain, chest pressure or palpitations   Respiratory: No SOB Gastrointestinal: See HPI and otherwise negative Genitourinary: No dysuria or change in urinary frequency Neurological: No headache or dizziness Musculoskeletal: No new muscle or back pain Hematologic: No bleeding or bruising Psychiatric: No history of depression or anxiety   Physical Exam:  Vital signs: BP 116/70   Pulse 86   Ht '5\' 4"'$  (1.626 m)   Wt 113 lb 4 oz (51.4 kg)   BMI 19.44 kg/m   General:   Pleasant Elderly, frail Caucasian female who appears to be in NAD, Well developed, Well nourished, alert and cooperative Head:  Normocephalic and atraumatic. Eyes:   PEERL, EOMI. No icterus. Conjunctiva pink. Ears:  Normal auditory acuity. Neck:  Supple Throat: Oral cavity and pharynx without inflammation, swelling or lesion.  Lungs: Respirations even and unlabored. Lungs clear to auscultation bilaterally.   No wheezes, crackles, or rhonchi.  Heart: Normal S1, S2. No MRG. Regular rate and rhythm. No peripheral edema, cyanosis or pallor.  Abdomen:  Soft, nondistended, nontender. No rebound  or guarding. Normal bowel sounds. No appreciable masses or hepatomegaly. Rectal:  Not performed.  Msk:  Symmetrical without gross deformities. Peripheral pulses intact.  Extremities:  Without edema, no deformity or joint abnormality.  Neurologic:  Alert and  oriented x4;  grossly normal neurologically.  Skin:   Dry and intact without significant lesions or rashes. Psychiatric: Oriented to person, place and time. Demonstrates good judgement and reason without abnormal affect or behaviors.  RELEVANT LABS AND IMAGING: CBC    Component Value Date/Time   WBC 3.0 (L) 06/20/2016 0856   WBC 7.3 04/08/2016 1004   RBC 4.41 06/20/2016 0856   RBC 4.34 04/08/2016 1004   HGB 13.0 06/20/2016 0856   HCT 38.9 06/20/2016 0856   PLT 245 06/20/2016 0856   MCV 88.2 06/20/2016 0856   MCH 29.5 06/20/2016 0856   MCH 29.0 04/08/2016 1004   MCHC 33.5 06/20/2016 0856   MCHC 32.9 04/08/2016 1004   RDW 16.2 (H) 06/20/2016 0856   LYMPHSABS 0.9 06/20/2016 0856   MONOABS 0.4 06/20/2016 0856   EOSABS 0.0 06/20/2016 0856   BASOSABS 0.0 06/20/2016 0856    CMP     Component Value Date/Time   NA 139 06/20/2016 0856   K 3.1 (L) 06/20/2016 0856   CL 99 02/10/2016 1412   CO2 23 06/20/2016 0856   GLUCOSE 104 06/20/2016 0856   BUN 8.3 06/20/2016 0856   CREATININE 0.6 06/20/2016 0856   CALCIUM 8.6 06/20/2016 0856   PROT 6.6 06/20/2016 0856   ALBUMIN 3.0 (L) 06/20/2016 0856   AST 19 06/20/2016 0856   ALT 26 06/20/2016 0856   ALKPHOS 106 06/20/2016 0856   BILITOT 0.56 06/20/2016 0856   GFRNONAA 38 (L) 01/31/2016 0353   GFRAA 44 (L) 01/31/2016 0353    Assessment: 1.Epigastric pain: History of 1-2 weeks of epigastric pain, worse with eating, patient status post chemotherapy and radiation for lung cancer, status post episode of esophagitis, recent EGD in March with findings of a stricture which was dilated; consider gastritis versus GERD versus PUD versus candida vs other 2. GERD: Patient denies symptoms,  controlled on Omeprazole 40 mg once daily  Plan: 1. Increased patient's Omeprazole to 40 mg twice a day, 30-60 minutes before eating. 2. Recommend the patient restart her Carafate 2-3 times daily, 1 hour before or 2 hours after eating and other medications 3. Reviewed antireflux diet and lifestyle modifications 4. Discussed with the patient and her family that if she does not see a decrease in symptoms over the next week, would recommend further evaluation with possible upper GI study versus EGD, could also add '150mg'$  Zantac qhs and GI Cocktail 5. Patient to follow with Dr. Ardis Hughs in the next 1-2 months or sooner if increase or worsening of symptoms  Ellouise Newer, PA-C [facility] 07/13/2016, 2:31 PM  Cc: [name] A, DO

## 2016-07-21 ENCOUNTER — Encounter (HOSPITAL_COMMUNITY): Payer: Self-pay

## 2016-07-21 ENCOUNTER — Ambulatory Visit (HOSPITAL_COMMUNITY)
Admission: RE | Admit: 2016-07-21 | Discharge: 2016-07-21 | Disposition: A | Discharge status: Home / Self Care | Payer: Medicare Other | Source: Ambulatory Visit | Attending: Nurse Practitioner | Admitting: Nurse Practitioner

## 2016-07-21 DIAGNOSIS — Z923 Personal history of irradiation: Secondary | ICD-10-CM | POA: Insufficient documentation

## 2016-07-21 DIAGNOSIS — I2510 Atherosclerotic heart disease of native coronary artery without angina pectoris: Secondary | ICD-10-CM | POA: Diagnosis not present

## 2016-07-21 DIAGNOSIS — J439 Emphysema, unspecified: Secondary | ICD-10-CM | POA: Diagnosis not present

## 2016-07-21 DIAGNOSIS — C3492 Malignant neoplasm of unspecified part of left bronchus or lung: Secondary | ICD-10-CM | POA: Diagnosis not present

## 2016-07-21 DIAGNOSIS — K449 Diaphragmatic hernia without obstruction or gangrene: Secondary | ICD-10-CM | POA: Insufficient documentation

## 2016-07-21 DIAGNOSIS — I700 Atherosclerosis of aorta: Secondary | ICD-10-CM | POA: Insufficient documentation

## 2016-07-21 MED ORDER — IOPAMIDOL (ISOVUE-300) INJECTION 61%
75.0000 mL | Freq: Once | INTRAVENOUS | Status: AC | PRN
  Administered 2016-07-21: 75 mL via INTRAVENOUS

## 2016-07-26 ENCOUNTER — Ambulatory Visit: Payer: Medicare Other | Admitting: Internal Medicine

## 2016-07-27 ENCOUNTER — Ambulatory Visit (HOSPITAL_BASED_OUTPATIENT_CLINIC_OR_DEPARTMENT_OTHER): Payer: Medicare Other | Admitting: Internal Medicine

## 2016-07-27 ENCOUNTER — Encounter: Payer: Self-pay | Admitting: Internal Medicine

## 2016-07-27 ENCOUNTER — Telehealth: Payer: Self-pay | Admitting: Cardiovascular Disease

## 2016-07-27 ENCOUNTER — Telehealth: Payer: Self-pay | Admitting: *Deleted

## 2016-07-27 VITALS — BP 124/80 | HR 77 | Temp 98.4°F | Resp 17 | Ht 64.0 in | Wt 111.5 lb

## 2016-07-27 DIAGNOSIS — R000 Tachycardia, unspecified: Secondary | ICD-10-CM | POA: Diagnosis not present

## 2016-07-27 DIAGNOSIS — Z5111 Encounter for antineoplastic chemotherapy: Secondary | ICD-10-CM

## 2016-07-27 DIAGNOSIS — C3432 Malignant neoplasm of lower lobe, left bronchus or lung: Secondary | ICD-10-CM | POA: Diagnosis present

## 2016-07-27 DIAGNOSIS — C3492 Malignant neoplasm of unspecified part of left bronchus or lung: Secondary | ICD-10-CM

## 2016-07-27 NOTE — Telephone Encounter (Signed)
Returned call.   No active pain.  Pt had chest pain last night but has history of this w/ esophageal pain. Usually takes Hycet for this and missed dose last night. No chest pain this AM. Stanton Kidney reports EKG performed in office d/t complaints, pt had variable rate noted on machine. She attempted to send fax but failed, I confirmed preferred number and Stanton Kidney is faxing over EKG to to [phone]  Patient has already left office. Will address w/ [facility]D and reach out to patient at home.

## 2016-07-27 NOTE — Progress Notes (Signed)
[place] [phone]   [phone]  OFFICE PROGRESS NOTE  [name], DO [address] [place] [address]  DIAGNOSIS: Stage IIIA (T2b, N2, M0) non-small cell lung cancer, squamous cell carcinoma, presented with large left lower lobe lung mass in addition to highly suspicious mediastinal lymphadenopathy. The patient could have a stage IV lung cancer if the hypothalamic lesion is consistent with metastatic disease.  PRIOR THERAPY:  Concurrent chemoradiation with weekly carboplatin for AUC of 2 and paclitaxel 45 MG/M2. Status post 3 cycles. Last dose was given 06/20/2016 with partial response.  CURRENT THERAPY: Observation.  INTERVAL HISTORY: Carol [name] 82 y.o. female returns to the clinic today for follow-up visit accompanied by her daughter and son-in-law. The patient is feeling a little bit better today but she has change in her heart rate during the visit. She tolerated the previous course of concurrent chemoradiation fairly well except for radiation induced esophagitis. She was treated with Carafate as well as profound pump inhibitor by her gastroenterologist. She is feeling much better. She continues to have shortness breath with exertion. She denied having any significant fatigue or weakness. She denied having any chest pain, cough or hemoptysis. She has no significant weight loss or night sweats. She had repeat CT scan of the chest performed recently and she is here for evaluation and discussion of her scan results.  MEDICAL HISTORY: Past Medical History:  Diagnosis Date  . Abdominal aortic aneurysm (AAA) ([facility])   . Cataract   . COPD (chronic obstructive pulmonary disease) ([facility])   . Coronary artery disease   . Encounter for antineoplastic chemotherapy 05/16/2016  . Fall   . GERD (gastroesophageal reflux disease)   . Hiatal hernia   . History of esophageal stricture   . Hypertension   . Lung cancer ([facility])    squamous cell carcinoma LLL   . Osteoporosis   . Peripheral arterial disease ([facility])     ALLERGIES:  has No Known Allergies.  MEDICATIONS:  Current Outpatient Prescriptions  Medication Sig Dispense Refill  . albuterol (PROVENTIL HFA) 108 (90 Base) MCG/ACT inhaler Inhale 2 puffs into the lungs 2 (two) times daily as needed for shortness of breath. 8 g 5  . amLODipine (NORVASC) 5 MG tablet Take 1 tablet (5 mg total) by mouth daily. 90 tablet 0  . aspirin EC 81 MG tablet Take 81 mg by mouth daily.    Marland Kitchen atorvastatin (LIPITOR) 10 MG tablet Take 1 tablet (10 mg total) by mouth daily at 6 PM. Reported on 01/29/2016 30 tablet 11  . HYDROcodone-acetaminophen (HYCET) 7.5-325 mg/15 ml solution Take 15 mLs by mouth 4 (four) times daily as needed for moderate pain. 120 mL 0  . LORazepam (ATIVAN) 0.5 MG tablet Take 1 tablet 5mns prior to MRI. Can take an additional tablet right before test if needed. 2 tablet 0  . omeprazole (PRILOSEC) 40 MG capsule Take 1 capsule (40 mg total) by mouth 2 (two) times daily. 60 capsule 2  . potassium chloride SA (K-DUR,KLOR-CON) 20 MEQ tablet Take 1 tablet (20 mEq total) by mouth daily. 10 tablet 0  . prochlorperazine (COMPAZINE) 10 MG tablet Take 1 tablet (10 mg total) by mouth every 6 (six) hours as needed for nausea or vomiting. 30 tablet 0  . Sennosides (EX-LAX) 15 MG TABS Take 15 mg by mouth daily as needed (For constipation.).     .Marland Kitchensucralfate (CARAFATE) 1 g tablet Take 1 tablet (1 g total) by  mouth 4 (four) times daily -  with meals and at bedtime. 120 tablet 1  . SYMBICORT 80-4.5 MCG/ACT inhaler Inhale 2 puffs into the lungs 2 (two) times daily as needed (FOR SOB). (Patient taking differently: Inhale 2 puffs into the lungs 2 times daily at 12 noon and 4 pm. ) 1 Inhaler 11   No current facility-administered medications for this visit.     SURGICAL HISTORY:  Past Surgical History:  Procedure Laterality Date  . CATARACT EXTRACTION    . COLONOSCOPY    . ENDOBRONCHIAL ULTRASOUND Bilateral  03/28/2016   Procedure: ENDOBRONCHIAL ULTRASOUND;  Surgeon: [name], MD;  Location: [facility];  Service: Cardiopulmonary;  Laterality: Bilateral;  dr. Ashok Cordia insists on mac not general anesthesia  . ESOPHAGOGASTRODUODENOSCOPY (EGD) WITH ESOPHAGEAL DILATION    . esopheal dilation  02-26-16  . EYE SURGERY      REVIEW OF SYSTEMS:  Constitutional: positive for fatigue Eyes: negative Ears, nose, mouth, throat, and face: negative Respiratory: positive for dyspnea on exertion Cardiovascular: negative Gastrointestinal: positive for dysphagia Genitourinary:negative Integument/breast: negative Hematologic/lymphatic: negative Musculoskeletal:negative Neurological: negative Behavioral/Psych: negative Endocrine: negative Allergic/Immunologic: negative   PHYSICAL EXAMINATION: General appearance: alert, cooperative and no distress Head: Normocephalic, without obvious abnormality, atraumatic Neck: no adenopathy, no JVD, supple, symmetrical, trachea midline and thyroid not enlarged, symmetric, no tenderness/mass/nodules Lymph nodes: Cervical, supraclavicular, and axillary nodes normal. Resp: clear to auscultation bilaterally Back: symmetric, no curvature. ROM normal. No CVA tenderness. Cardio: regular rate and rhythm, S1, S2 normal, no murmur, click, rub or gallop GI: soft, non-tender; bowel sounds normal; no masses,  no organomegaly Extremities: extremities normal, atraumatic, no cyanosis or edema Neurologic: Alert and oriented X 3, normal strength and tone. Normal symmetric reflexes. Normal coordination and gait  ECOG PERFORMANCE STATUS: 1 - Symptomatic but completely ambulatory  Blood pressure 124/80, pulse 77, temperature 98.4 F (36.9 C), temperature source Oral, resp. rate 17, height '5\' 4"'$  (1.626 m), weight 111 lb 8 oz (50.6 kg), SpO2 94 %.  LABORATORY DATA: Lab Results  Component Value Date   WBC 3.0 (L) 06/20/2016   HGB 13.0 06/20/2016   HCT 38.9 06/20/2016   MCV 88.2  06/20/2016   PLT 245 06/20/2016      Chemistry      Component Value Date/Time   NA 139 06/20/2016 0856   K 3.1 (L) 06/20/2016 0856   CL 99 02/10/2016 1412   CO2 23 06/20/2016 0856   BUN 8.3 06/20/2016 0856   CREATININE 0.6 06/20/2016 0856      Component Value Date/Time   CALCIUM 8.6 06/20/2016 0856   ALKPHOS 106 06/20/2016 0856   AST 19 06/20/2016 0856   ALT 26 06/20/2016 0856   BILITOT 0.56 06/20/2016 0856       RADIOGRAPHIC STUDIES: Ct Chest W Contrast  Result Date: 07/21/2016 CLINICAL DATA:  Non-small-cell lung cancer diagnosed in February this year. Chemotherapy and XRT are now complete her patient. EXAM: CT CHEST WITH CONTRAST TECHNIQUE: Multidetector CT imaging of the chest was performed during intravenous contrast administration. CONTRAST:  54m ISOVUE-300 IOPAMIDOL (ISOVUE-300) INJECTION 61% COMPARISON:  PET-CT 03/15/2016 FINDINGS: Cardiovascular: The heart size is normal. No pericardial effusion. Coronary artery calcification is noted. Atherosclerotic calcification is noted in the wall of the throracic aorta. Mediastinum/Nodes: Lymph nodes are seen scattered through the mediastinum and while no individual lymph node meet CT criteria for pathologic enlargement, some of these have progressed in the interval. 6 mm short axis AP window lymph node on image 50 series 2  today was 3-4 mm short axis previously. High paraesophageal lymph nodes (see images 18 and 20 of series 2) are new in the interval. Numerous other scattered mediastinal lymph nodes are stable. No right hilar lymphadenopathy. Abnormal soft tissue attenuation tracking into the inferior left hilum is not substantially changed. There is no axillary lymphadenopathy. The mid and distal esophagus has mild circumferential wall thickening, new in the interval. Lungs/Pleura: Emphysema again noted bilaterally. Interval development of interstitial and alveolar opacity extending through the parahilar left lung, compatible with post  radiation change. The dominant large left lower lobe mass lesion has become less solid in the interval and measures about 4.7 cm in the same dimension today that it measured 6.5 cm on 02/16/2016. Stable right middle lobe scarring. Upper Abdomen: Small hiatal hernia noted. Left kidney is atrophic. Atherosclerotic calcification noted abdominal aorta. 2.2 cm probable cyst upper pole right kidney. Musculoskeletal: Bone windows reveal no worrisome lytic or sclerotic osseous lesions. Probable sebaceous cyst posterior left paramidline lower back. IMPRESSION: 1. Interval development of post radiation changes in the medial left lung with decrease in size in confluent appearance of the dominant left lower lobe pulmonary mass seen previously. The lesion now appears much less bulky. 2. Numerous stable nonenlarged mediastinal lymph nodes with some increase in tiny lymph node seen previously. Close attention on follow-up imaging recommended. 3. Coronary artery and thoracoabdominal atherosclerosis. 4. Emphysema. 5. Mild circumferential wall thickening in the mid to distal esophagus associated with small hiatal hernia. Esophagitis would be a consideration. Electronically Signed   By: Misty Stanley M.D.   On: 07/21/2016 17:08    ASSESSMENT AND PLAN: This is a very pleasant 82 years old white female with stage IIIA non-small cell lung cancer, squamous cell carcinoma. She underwent a course of concurrent chemoradiation with weekly carboplatin and paclitaxel. The patient tolerated her previous treatment fairly well with no significant complaints except for radiation induced esophagitis. She is feeling much better. The recent CT scan of the chest showed development of postradiation changes but there was decrease in the size of the left lower lobe lung mass and the patient continues to have small stable mediastinal lymph nodes. I discussed the scan results and showed the images to the patient and her family. I discussed with her  other treatment options including consolidation chemotherapy with carboplatin and paclitaxel versus continuous observation and close monitoring. With her age and comorbidities was felt that continuous observation will be a better options for this patient. I will see her back for follow-up visit in 3 months for reevaluation with repeat CT scan of the chest. For the tachycardia, tiredness EKG for this patient and we will forward to Dr. Jenkins Rouge, her cardiologist for evaluation and recommendation. She was advised to call immediately if she has any concerning symptoms in the interval. The patient voices understanding of current disease status and treatment options and is in agreement with the current care plan.  All questions were answered. The patient knows to call the clinic with any problems, questions or concerns. We can certainly see the patient much sooner if necessary.  Disclaimer: This note was dictated with voice recognition software. Similar sounding words can inadvertently be transcribed and may not be corrected upon review.

## 2016-07-27 NOTE — Telephone Encounter (Signed)
  Chest pain syndrome is more consistent with esophageal, rather than coronary cause. ECGs show frequent PACs and PVCs, but are not concerning for an ischemic event. Would recommend checking for electrolyte abnormalities, particularly hypokalemia. CMET, Mg level please. If chest pain recurs, consider echo for pericarditis (radiation induced?).

## 2016-07-27 NOTE — Telephone Encounter (Signed)
I have spoken with Stanton Kidney at Dr. Worthy Flank office. They performed CMET today but this test has not resulted - will inform us if abnormal. Will order Mg level at Dr. Worthy Flank [place].  I spoke to patient's daughter [name] in regards to [employer] recommendations. She is aware that Stanton Kidney will contact and set up for labwork if needed. Aware to call if pt experiencing recurrent symptoms, can order tests if needed. Aware they may call for any other needs. Thanks and understanding verbalized.

## 2016-07-27 NOTE — Telephone Encounter (Signed)
Call to pt, advised EKG was sent to cardiology office, if chest pain continues to go to ED, call Cariology office to have further evaluation of the pain. Likely related to previous issues with esophagus.

## 2016-07-27 NOTE — Telephone Encounter (Signed)
Have checked for receipt of EKG - nothing received via fax, however, the EKG dated today has been scanned into EPIC.  Called patient's home. Spoke to son in law Mr. Jack Quarto (listed on DPR). He answered questions regarding patient situation.  Notes she has just endured 8 wks of chemo and radiation for left lobe mass. Notes "daily pummeling of photons and pharmaceuticals" has understandable left her very week.  She had 3-4 weeks of esophagitis. Notes she has had appetite but found it too painful to eat, poor PO intake.  Had episode of chest pain in the middle of the night lying in bed - once she got up to go to bathroom the pain subsided.  Seen today in Dr. Worthy Flank office. No c/o chest pain this AM, but they note the EKG was performed bc of difficulty in getting a radial pulse, and a noted difference in rate betw right and left radial checks. When EKG was performed the technician noted different fluctuations from 30bpm to 130bpm.  Requesting review by cardiology of today's EKG.  Informed caller I would defer to [employer] and give advisements.

## 2016-07-27 NOTE — Telephone Encounter (Signed)
New message      Stanton Kidney is calling per Dr.Muhamad, because an EKG was done in the office today and thy have faxed it over to [phone]  BP 124/80 hr 77  Range was 130-30 it jumped all over the place, pt verbalized to [facility] that she was not having any chest pain in office but at night it was painful across her chest

## 2016-07-27 NOTE — Telephone Encounter (Signed)
Call to Dr. Cherlyn Cushing office, advised  pt here for follow up visit with c/o chest pain that woke her up last night. EKG done, faxed to Cardiology.

## 2016-08-01 ENCOUNTER — Other Ambulatory Visit (INDEPENDENT_AMBULATORY_CARE_PROVIDER_SITE_OTHER): Payer: Medicare Other

## 2016-08-01 ENCOUNTER — Ambulatory Visit (INDEPENDENT_AMBULATORY_CARE_PROVIDER_SITE_OTHER): Payer: Medicare Other | Admitting: Adult Health

## 2016-08-01 ENCOUNTER — Telehealth: Payer: Self-pay | Admitting: Internal Medicine

## 2016-08-01 ENCOUNTER — Other Ambulatory Visit: Payer: Self-pay | Admitting: Adult Health

## 2016-08-01 ENCOUNTER — Encounter: Payer: Self-pay | Admitting: Adult Health

## 2016-08-01 VITALS — BP 132/72 | HR 68 | Temp 97.9°F | Ht 63.0 in | Wt 112.0 lb

## 2016-08-01 DIAGNOSIS — J449 Chronic obstructive pulmonary disease, unspecified: Secondary | ICD-10-CM | POA: Diagnosis not present

## 2016-08-01 DIAGNOSIS — R0600 Dyspnea, unspecified: Secondary | ICD-10-CM

## 2016-08-01 LAB — BASIC METABOLIC PANEL
BUN: 9 mg/dL (ref 6–23)
CHLORIDE: 101 meq/L (ref 96–112)
CO2: 27 meq/L (ref 19–32)
CREATININE: 0.52 mg/dL (ref 0.40–1.20)
Calcium: 7.8 mg/dL — ABNORMAL LOW (ref 8.4–10.5)
GFR: 120.04 mL/min (ref >60.00)
GLUCOSE: 93 mg/dL (ref 70–99)
Potassium: 3.4 mEq/L — ABNORMAL LOW (ref 3.5–5.1)
Sodium: 136 mEq/L (ref 135–145)

## 2016-08-01 LAB — BRAIN NATRIURETIC PEPTIDE: Pro B Natriuretic peptide (BNP): 106 pg/mL — ABNORMAL HIGH (ref 0.0–100.0)

## 2016-08-01 LAB — SEDIMENTATION RATE: Sed Rate: 74 mm/hr — ABNORMAL HIGH (ref 0–30)

## 2016-08-01 MED ORDER — PREDNISONE 20 MG PO TABS: 40.0000 mg | ORAL_TABLET | Freq: Every day | ORAL | 0 refills | Status: DC

## 2016-08-01 MED ORDER — BUDESONIDE-FORMOTEROL FUMARATE 160-4.5 MCG/ACT IN AERO: 2.0000 | INHALATION_SPRAY | Freq: Two times a day (BID) | RESPIRATORY_TRACT | 5 refills | Status: AC

## 2016-08-01 NOTE — Progress Notes (Addendum)
Subjective:    Patient ID: Carol [name], female    DOB: [birth year], 82 y.o.   MRN: [id number]  HPI 82 yo female with severe COPD and Lung cancer   08/01/2016 Acute OV :  Pt presents for an acute office visit. She says  She does have dry cough . No fever or discolored mucus.  No dyspnea at rest . Has severe DOE with minimal activity .  Prior to XRT /chemo she was able to walk short distance and do all ADLS.  Now is not able to shower without significant dyspnea.  She remains on Symbicort 80 Twice daily  . Uses albuterol As needed   Walk test in office with no desats on room air.  Spirometry today  does show decline with FEV1 at 37%, (previously 46%)   She has known non small cell lung cancer , squamous cell carcinoma undergoing chemoradiation,  Last dose 06/20/16 . She is followed by Dr. Mckinley Jewel and Dr. Tammi Klippel .  Most recent CT chest showed partial response with radiation changes in medial left lung with decrease in size LLL mass. Slight increase in tiny lymph node.  Labs reviewed from July show stable hbg at 13.  Says she remains weak since finishing chemo/XRT .  Pt is accompanied by her daughter and son in law.   She denies hemoptysis , calf pain, edema , fever or discolored mucus.   She has had esophageal issues . She has followed with GI Dr. Ardis Hughs. She is feeling better since esophageal dilation but after XRT swallowing issues returned. She was started on Carafate. This has helped. She did lose 10lbs during chemo  chemo.       Past Medical History:  Diagnosis Date  . Abdominal aortic aneurysm (AAA) ([place])   . Cataract   . COPD (chronic obstructive pulmonary disease) ([facility])   . Coronary artery disease   . Encounter for antineoplastic chemotherapy 05/16/2016  . Fall   . GERD (gastroesophageal reflux disease)   . Hiatal hernia   . History of esophageal stricture   . Hypertension   . Lung cancer ([place])    squamous cell carcinoma LLL  . Osteoporosis   . Peripheral arterial  disease [facility])    Current Outpatient Prescriptions on File Prior to Visit  Medication Sig Dispense Refill  . albuterol (PROVENTIL HFA) 108 (90 Base) MCG/ACT inhaler Inhale 2 puffs into the lungs 2 (two) times daily as needed for shortness of breath. 8 g 5  . amLODipine (NORVASC) 5 MG tablet Take 1 tablet (5 mg total) by mouth daily. 90 tablet 0  . aspirin EC 81 MG tablet Take 81 mg by mouth daily.    Marland Kitchen atorvastatin (LIPITOR) 10 MG tablet Take 1 tablet (10 mg total) by mouth daily at 6 PM. Reported on 01/29/2016 30 tablet 11  . LORazepam (ATIVAN) 0.5 MG tablet Take 1 tablet 11mns prior to MRI. Can take an additional tablet right before test if needed. 2 tablet 0  . Sennosides (EX-LAX) 15 MG TABS Take 15 mg by mouth daily as needed (For constipation.).     .Marland KitchenSYMBICORT 80-4.5 MCG/ACT inhaler Inhale 2 puffs into the lungs 2 (two) times daily as needed (FOR SOB). (Patient taking differently: Inhale 2 puffs into the lungs 2 times daily at 12 noon and 4 pm. ) 1 Inhaler 11  . HYDROcodone-acetaminophen (HYCET) 7.5-325 mg/15 ml solution Take 15 mLs by mouth 4 (four) times daily as needed for moderate pain. (Patient not taking: Reported on  08/01/2016) 120 mL 0  . omeprazole (PRILOSEC) 40 MG capsule Take 1 capsule (40 mg total) by mouth 2 (two) times daily. (Patient not taking: Reported on 08/01/2016) 60 capsule 2  . potassium chloride SA (K-DUR,KLOR-CON) 20 MEQ tablet Take 1 tablet (20 mEq total) by mouth daily. (Patient not taking: Reported on 08/01/2016) 10 tablet 0  . prochlorperazine (COMPAZINE) 10 MG tablet Take 1 tablet (10 mg total) by mouth every 6 (six) hours as needed for nausea or vomiting. (Patient not taking: Reported on 08/01/2016) 30 tablet 0  . sucralfate (CARAFATE) 1 g tablet Take 1 tablet (1 g total) by mouth 4 (four) times daily -  with meals and at bedtime. (Patient not taking: Reported on 08/01/2016) 120 tablet 1   No current facility-administered medications on file prior to visit.       Review of Systems Constitutional:   No  weight loss, night sweats,  Fevers, chills, fatigue, or  lassitude.  HEENT:   No headaches,  Difficulty swallowing,  Tooth/dental problems, or  Sore throat,                No sneezing, itching, ear ache, nasal congestion, post nasal drip,   CV:  No chest pain,  Orthopnea, PND, swelling in lower extremities, anasarca, dizziness, palpitations, syncope.   GI  No heartburn, indigestion, abdominal pain, nausea, vomiting, diarrhea, change in bowel habits, loss of appetite, bloody stools.   Resp: + shortness of breath with exertion  No excess mucus, no productive cough,  No non-productive cough,  No coughing up of blood.  No change in color of mucus.  No wheezing.  No chest wall deformity  Skin: no rash or lesions.  GU: no dysuria, change in color of urine, no urgency or frequency.  No flank pain, no hematuria   MS:  No joint pain or swelling.  No decreased range of motion.  No back pain.  Psych:  No change in mood or affect. No depression or anxiety.  No memory loss.         Objective:   Physical Exam Vitals:   08/01/16 0926  BP: 132/72  Pulse: 68  Temp: 97.9 F (36.6 C)  TempSrc: Oral  SpO2: 91%  Weight: 112 lb (50.8 kg)  Height: '5\' 3"'$  (1.6 m)   GEN: A/Ox3; pleasant , NAD, frail , in WC    HEENT:  [place]/AT,  EACs-clear, TMs-wnl, NOSE-clear, THROAT-clear, no lesions, no postnasal drip or exudate noted.   NECK:  Supple w/ fair ROM; no JVD; normal carotid impulses w/o bruits; no thyromegaly or nodules palpated; no lymphadenopathy.    RESP  Decreased BS in bases , w/o, wheezes/ rales/ or rhonchi. no accessory muscle use, no dullness to percussion  CARD:  RRR, no m/r/g  , no peripheral edema, pulses intact, no cyanosis or clubbing. Neg calf pain /tenderness   GI:   Soft & nt; nml bowel sounds; no organomegaly or masses detected.   Musco: Warm bil, no deformities or joint swelling noted.   Neuro: alert, no focal deficits noted.     Skin: Warm, no lesions or rashes   Tammy Parrett NP-C  [facility]  08/01/2016

## 2016-08-01 NOTE — Progress Notes (Signed)
Called spoke with pt. Reviewed TP's recs. Scheduled ov with JN on 08/16/16 and sent pred to the pharmacy. She voiced understanding and had no further questions. Nothing further needed.

## 2016-08-01 NOTE — Addendum Note (Signed)
Addended by: Osa Craver on: 08/01/2016 11:08 AM   Modules accepted: Orders

## 2016-08-01 NOTE — Telephone Encounter (Signed)
CALLED PATIENT TO CONF APPT PER 08/23 LOS. L/M ON V/M. APPT LTR & SCHD MAILED.

## 2016-08-01 NOTE — Progress Notes (Signed)
Note reviewed.  Sonia Baller Ashok Cordia, M.D. [facility] Pager:  [phone] After 3pm or if no response, call [phone] 11:10 PM 08/01/16

## 2016-08-01 NOTE — Patient Instructions (Addendum)
Increase Symbicort 160 2 puffs Twice daily  , rinse after use.  Add Spiriva Respimat 2 puffs daily , rinse after use.  Labs today .  Follow up with Dr. Ashok Cordia in 6 weeks and as planned.  Please contact office for sooner follow up if symptoms do not improve or worsen or seek emergency care

## 2016-08-01 NOTE — Assessment & Plan Note (Addendum)
Severe COPD with decline in lung function since XRT with post radiation changes on CT chest  She has worsening DOE .  Will check labs with ESR /BNP and D Dimer  No desats with ambulation . No need for O2.  Plan  Patient Instructions  Increase Symbicort 160 2 puffs Twice daily  , rinse after use.  Add Spiriva Respimat 2 puffs daily , rinse after use.  Labs today .  Follow up with Dr. Ashok Cordia in 6 weeks and as planned.  Please contact office for sooner follow up if symptoms do not improve or worsen or seek emergency care

## 2016-08-02 ENCOUNTER — Telehealth: Payer: Self-pay | Admitting: Adult Health

## 2016-08-02 ENCOUNTER — Ambulatory Visit: Payer: Self-pay | Admitting: Radiation Oncology

## 2016-08-02 DIAGNOSIS — R7989 Other specified abnormal findings of blood chemistry: Secondary | ICD-10-CM

## 2016-08-02 LAB — D-DIMER, QUANTITATIVE (NOT AT ARMC): D DIMER QUANT: 2.8 ug{FEU}/mL — AB (ref <0.50)

## 2016-08-02 NOTE — Telephone Encounter (Signed)
Notes Recorded by Melvenia Needles, NP on 08/02/2016 at 2:10 PM EDT D Dimer is elevated  Will need to set up CTA chest PE protocol to r/o PE  Please contact office for sooner follow up if symptoms do not improve or worsen or seek emergency care   Called spoke with pt's son in law. Reviewed TP's results and recs. He voiced understanding and had no further questions. Order placed. Nothing further needed.

## 2016-08-02 NOTE — Progress Notes (Signed)
LVM for pt to return call

## 2016-08-02 NOTE — Progress Notes (Signed)
Called spoke with pt's son in law. Reviewed TP's results and recs. He voiced understanding and had no further questions. Order placed. Nothing further needed.

## 2016-08-03 ENCOUNTER — Ambulatory Visit (INDEPENDENT_AMBULATORY_CARE_PROVIDER_SITE_OTHER)
Admission: RE | Admit: 2016-08-03 | Discharge: 2016-08-03 | Disposition: A | Discharge status: Home / Self Care | Payer: Medicare Other | Source: Ambulatory Visit | Attending: Adult Health | Admitting: Adult Health

## 2016-08-03 DIAGNOSIS — R791 Abnormal coagulation profile: Secondary | ICD-10-CM | POA: Diagnosis not present

## 2016-08-03 DIAGNOSIS — R7989 Other specified abnormal findings of blood chemistry: Secondary | ICD-10-CM

## 2016-08-03 MED ORDER — IOPAMIDOL (ISOVUE-370) INJECTION 76%
80.0000 mL | Freq: Once | INTRAVENOUS | Status: AC | PRN
  Administered 2016-08-03: 80 mL via INTRAVENOUS

## 2016-08-09 ENCOUNTER — Ambulatory Visit
Admission: RE | Admit: 2016-08-09 | Discharge: 2016-08-09 | Disposition: A | Discharge status: Home / Self Care | Payer: Medicare Other | Source: Ambulatory Visit | Attending: Radiation Oncology | Admitting: Radiation Oncology

## 2016-08-09 VITALS — BP 150/65 | HR 96 | Temp 97.9°F | Resp 16 | Ht 63.0 in | Wt 110.5 lb

## 2016-08-09 DIAGNOSIS — K208 Other esophagitis: Secondary | ICD-10-CM | POA: Insufficient documentation

## 2016-08-09 DIAGNOSIS — C3432 Malignant neoplasm of lower lobe, left bronchus or lung: Secondary | ICD-10-CM | POA: Insufficient documentation

## 2016-08-09 DIAGNOSIS — C3492 Malignant neoplasm of unspecified part of left bronchus or lung: Secondary | ICD-10-CM

## 2016-08-09 DIAGNOSIS — J449 Chronic obstructive pulmonary disease, unspecified: Secondary | ICD-10-CM

## 2016-08-09 NOTE — Progress Notes (Signed)
Radiation Oncology         [phone] ________________________________  Name: Carol [name] MRN: [id number]  Date: 08/09/2016  DOB: 1932/05/14  Post Treatment Note  CC: [name], DO  [name], MD  Diagnosis:   Stage IIIA, T2b, N2, M0, NSCLC, squamous cell carcinoma of the left lower lobe, lung  Interval Since Last Radiation: 6 weeks   05/10/2016-06/24/2016: 66 Gy in 33 fractions to the left lung cancer  Narrative:  The patient returns today for routine follow-up. During treatment the patient did have some difficulty with radiation esophagitis. She also was seen Dr. Julien Nordmann on 07/27/2016. Her CT imaging of the chest with contrast on 07/21/2016 revealed  Postradiation change within the left perihilar region, as well as improvement of the left lower lobe lesion measuring 4.7 cm which had previously been 6.5 in March 2017. It is become less solid, stability of mediastinal adenopathy was again noted. She did experience shortness of breath and was seen by pulmonary and on 08/03/2016 CT angiogram was negative for pulmonary embolism, she did have a small aneurysm arising from the medial aspect of the descending thoracic aorta and post radiation treatment effect in the left surrounding the residual mass. There is question of reflux of contrast into the IVC and hepatic veins.  She was instructed to increase her Symbicort twice daily, and was given Spiriva. She also started Prednisone 40 mg last week as well.                        On review of systems, the patient states she is feeling significantly better today and attributes this to her steroids. She denies any orthopnea today, but last week states this was quite concerning to her and notable at night. She denies any chest pain, shortness of breath now, or productive cough. Last week however her cough was mildly productive, and she had low grade fevers. She denies any fevers at this time. No other complaints are verbalized.  ALLERGIES:  has  No Known Allergies.  Meds: Current Outpatient Prescriptions  Medication Sig Dispense Refill  . albuterol (PROVENTIL HFA) 108 (90 Base) MCG/ACT inhaler Inhale 2 puffs into the lungs 2 (two) times daily as needed for shortness of breath. 8 g 5  . amLODipine (NORVASC) 5 MG tablet Take 1 tablet (5 mg total) by mouth daily. 90 tablet 0  . aspirin EC 81 MG tablet Take 81 mg by mouth daily.    Marland Kitchen atorvastatin (LIPITOR) 10 MG tablet Take 1 tablet (10 mg total) by mouth daily at 6 PM. Reported on 01/29/2016 30 tablet 11  . budesonide-formoterol (SYMBICORT) 160-4.5 MCG/ACT inhaler Inhale 2 puffs into the lungs 2 (two) times daily. 1 Inhaler 5  . budesonide-formoterol (SYMBICORT) 160-4.5 MCG/ACT inhaler Inhale 2 puffs into the lungs 2 (two) times daily.    Marland Kitchen HYDROcodone-acetaminophen (HYCET) 7.5-325 mg/15 ml solution Take 15 mLs by mouth 4 (four) times daily as needed for moderate pain. 120 mL 0  . omeprazole (PRILOSEC) 40 MG capsule Take 1 capsule (40 mg total) by mouth 2 (two) times daily. 60 capsule 2  . predniSONE (DELTASONE) 20 MG tablet Take 2 tablets (40 mg total) by mouth daily with breakfast. 30 tablet 0  . Sennosides (EX-LAX) 15 MG TABS Take 15 mg by mouth daily as needed (For constipation.).      No current facility-administered medications for this encounter.     Physical Findings:  height is '5\' 3"'$  (1.6 m) and  weight is 110 lb 8 oz (50.1 kg). Her temperature is 97.9 F (36.6 C). Her blood pressure is 150/65 (abnormal) and her pulse is 96. Her respiration is 16 and oxygen saturation is 94%.  In general this is a well appearing Caucasian female in no acute distress. She is alert and oriented x4 and appropriate throughout the examination.  Skin is intact without any evidence of gross lesions. Cardiovascular exam reveals a regular rate and rhythm, no clicks rubs or murmurs are auscultated. Chest is clear to auscultation bilaterally without wheezing or consolidation. The skin on her anterior and  posterior chest is examined, and reveals no evidence of desquamation.  Lab Findings: Lab Results  Component Value Date   WBC 3.0 (L) 06/20/2016   HGB 13.0 06/20/2016   HCT 38.9 06/20/2016   MCV 88.2 06/20/2016   PLT 245 06/20/2016     Radiographic Findings: Ct Chest W Contrast  Result Date: 07/21/2016 CLINICAL DATA:  Non-small-cell lung cancer diagnosed in February this year. Chemotherapy and XRT are now complete her patient. EXAM: CT CHEST WITH CONTRAST TECHNIQUE: Multidetector CT imaging of the chest was performed during intravenous contrast administration. CONTRAST:  49m ISOVUE-300 IOPAMIDOL (ISOVUE-300) INJECTION 61% COMPARISON:  PET-CT 03/15/2016 FINDINGS: Cardiovascular: The heart size is normal. No pericardial effusion. Coronary artery calcification is noted. Atherosclerotic calcification is noted in the wall of the throracic aorta. Mediastinum/Nodes: Lymph nodes are seen scattered through the mediastinum and while no individual lymph node meet CT criteria for pathologic enlargement, some of these have progressed in the interval. 6 mm short axis AP window lymph node on image 50 series 2 today was 3-4 mm short axis previously. High paraesophageal lymph nodes (see images 18 and 20 of series 2) are new in the interval. Numerous other scattered mediastinal lymph nodes are stable. No right hilar lymphadenopathy. Abnormal soft tissue attenuation tracking into the inferior left hilum is not substantially changed. There is no axillary lymphadenopathy. The mid and distal esophagus has mild circumferential wall thickening, new in the interval. Lungs/Pleura: Emphysema again noted bilaterally. Interval development of interstitial and alveolar opacity extending through the parahilar left lung, compatible with post radiation change. The dominant large left lower lobe mass lesion has become less solid in the interval and measures about 4.7 cm in the same dimension today that it measured 6.5 cm on  02/16/2016. Stable right middle lobe scarring. Upper Abdomen: Small hiatal hernia noted. Left kidney is atrophic. Atherosclerotic calcification noted abdominal aorta. 2.2 cm probable cyst upper pole right kidney. Musculoskeletal: Bone windows reveal no worrisome lytic or sclerotic osseous lesions. Probable sebaceous cyst posterior left paramidline lower back. IMPRESSION: 1. Interval development of post radiation changes in the medial left lung with decrease in size in confluent appearance of the dominant left lower lobe pulmonary mass seen previously. The lesion now appears much less bulky. 2. Numerous stable nonenlarged mediastinal lymph nodes with some increase in tiny lymph node seen previously. Close attention on follow-up imaging recommended. 3. Coronary artery and thoracoabdominal atherosclerosis. 4. Emphysema. 5. Mild circumferential wall thickening in the mid to distal esophagus associated with small hiatal hernia. Esophagitis would be a consideration. Electronically Signed   By: EMisty StanleyM.D.   On: 07/21/2016 17:08   Ct Angio Chest W/cm &/or Wo Cm  Result Date: 08/03/2016 CLINICAL DATA:  Shortness of breath for past 3 days. History of lung carcinoma EXAM: CT ANGIOGRAPHY CHEST WITH CONTRAST TECHNIQUE: Multidetector CT imaging of the chest was performed using the standard protocol during bolus  administration of intravenous contrast. Multiplanar CT image reconstructions and MIPs were obtained to evaluate the vascular anatomy. CONTRAST:  80 mL Isovue 370 nonionic. COMPARISON:  Chest CT July 21, 2016 FINDINGS: Cardiovascular: There is no demonstrable pulmonary embolus. There is a localized saccular aneurysm arising from the medial aspect of the mid descending thoracic aorta, best seen on axial slice 55 series 4 and coronal slice 75 series 7. This localized saccular aneurysm measures 1.3 cm from superior to inferior, 1.3 cm from right to left, and 1.1 cm from anterior to posterior dimension. There is  calcification in the periphery of this small saccular aneurysm arising in the descending thoracic aorta. There is no dilatation of the ascending thoracic aorta. There is no appreciable thoracic aortic is that dissection. There is extensive atherosclerotic calcification in the proximal great vessels, most severe in the proximal left common carotid and left subclavian arteries. There is extensive coronary artery calcification. The pericardium is not appreciably thickened. Mediastinum/Nodes: Visualized thyroid appears normal. There are scattered subcentimeter mediastinal lymph nodes. There is no adenopathy by size criteria. There is a small hiatal hernia. Lungs/Pleura: There is underlying centrilobular emphysematous change. There is persistent consolidation with probable mass in the superior segment left lower lobe measuring 4.0 x 3.2 cm, slightly smaller compared to previous study. There are changes of radiation therapy on the left with areas of fibrosis in the periphery of the left lung. No new areas of opacity are identified. There is a small left pleural effusion, stable. Upper Abdomen: In the visualized upper abdomen, there is atherosclerotic calcification in the aorta. The aorta is ectatic. Visualized left kidney is atrophic. There is reflux of contrast into the inferior vena cava and hepatic veins. Musculoskeletal: There is no evidence of blastic or lytic bone lesion. Review of the MIP images confirms the above findings. IMPRESSION: No demonstrable pulmonary embolus. Small saccular aneurysm arising from the medial aspect of the midportion descending thoracic aorta. There is no appreciable dilatation of the ascending thoracic aorta. No dissection. There is extensive atherosclerotic calcification throughout the aorta and great vessels as well as extensive coronary artery calcification. Underlying emphysema. Residual mass with consolidation left lower lobe, slightly smaller compared to recent prior study. Post  radiation therapy change noted on the left. No new opacity. No adenopathy by size criteria. Small hiatal hernia.  Atrophic left kidney. Reflux of contrast into the inferior vena cava and hepatic veins may be indicative of a degree of elevated right heart pressure. Electronically Signed   By: Lowella Grip III M.D.   On: 08/03/2016 08:57    Impression/Plan: 1. Stage IIIA, T2b, N2, M0, NSCLC, squamous cell carcinoma of the left lower lobe, lung. The patient tolerated radiotherapy. She will be following up with medical oncology in a few weeks. We are pleased with the improvement in her tumor size and characteristics. We will see her back in 6 weeks to ensure her pneumonitis has resolved. 2. Radiation esophagitis. This has improved, and the patient is tolerating a regular diet. She is encouraged to keep [place] informed of any worsening symptoms. 3. Radiation Pneumonitis. After reviewing the patient's symptoms and clinical improvement with prednisone '40mg'$  since last week, I would agree that this picture appears to be consistent with pneumonitis due to radiotherapy. We would recommend tapering her steroids by '10mg'$  each week and will plan to see her back in 6 weeks to ensure that she is doing well and clinically stable. She will call if she has questions or concerns prior to that  visit, and is counseled on the possibility of developing thrush in the meantime. If she does develop this from her steroids, I would be happy to prescribe [facility] mouthwash.    Carola Rhine, PAC

## 2016-08-09 NOTE — Progress Notes (Signed)
Carol [name] reforts pain and SOB when ambulating.  She reports that she is eating without any discomfort, small amounts at a time.  Mild hyperpigmentation and dryness in the mid back region.  Travel by W/C with family present.   BP (!) 150/65 (BP Location: Right Arm, Patient Position: Sitting, Cuff Size: Normal)   Pulse 96   Temp 97.9 F (36.6 C)   Resp 16   Ht '5\' 3"'$  (1.6 m)   Wt 110 lb 8 oz (50.1 kg)   SpO2 94%   BMI 19.57 kg/m     Wt Readings from Last 3 Encounters:  08/09/16 110 lb 8 oz (50.1 kg)  08/01/16 112 lb (50.8 kg)  07/27/16 111 lb 8 oz (50.6 kg)

## 2016-08-10 ENCOUNTER — Telehealth: Payer: Self-pay | Admitting: *Deleted

## 2016-08-10 NOTE — Telephone Encounter (Signed)
CALLED PATIENT TO INFORM OF FU WITH ALISON PERKINS ON 09-21-16 @ 2 PM , SPOKE WITH JERRY MCGUIRE AND HE IS AWARE OF THIS APPT.

## 2016-08-16 ENCOUNTER — Encounter: Payer: Self-pay | Admitting: Pulmonary Disease

## 2016-08-16 ENCOUNTER — Ambulatory Visit (INDEPENDENT_AMBULATORY_CARE_PROVIDER_SITE_OTHER): Payer: Medicare Other | Admitting: Pulmonary Disease

## 2016-08-16 VITALS — BP 130/68 | HR 92 | Ht 63.0 in | Wt 108.4 lb

## 2016-08-16 DIAGNOSIS — Z23 Encounter for immunization: Secondary | ICD-10-CM

## 2016-08-16 DIAGNOSIS — C3490 Malignant neoplasm of unspecified part of unspecified bronchus or lung: Secondary | ICD-10-CM | POA: Diagnosis not present

## 2016-08-16 DIAGNOSIS — J449 Chronic obstructive pulmonary disease, unspecified: Secondary | ICD-10-CM

## 2016-08-16 DIAGNOSIS — J700 Acute pulmonary manifestations due to radiation: Secondary | ICD-10-CM

## 2016-08-16 DIAGNOSIS — C3492 Malignant neoplasm of unspecified part of left bronchus or lung: Secondary | ICD-10-CM | POA: Diagnosis not present

## 2016-08-16 MED ORDER — TIOTROPIUM BROMIDE MONOHYDRATE 2.5 MCG/ACT IN AERS: 2.0000 | INHALATION_SPRAY | Freq: Every day | RESPIRATORY_TRACT | 6 refills | Status: AC

## 2016-08-16 MED ORDER — PREDNISONE 10 MG PO TABS: ORAL_TABLET | ORAL | 0 refills | Status: DC

## 2016-08-16 MED ORDER — HYDROCODONE-ACETAMINOPHEN 7.5-325 MG/15ML PO SOLN: 15.0000 mL | Freq: Four times a day (QID) | ORAL | 0 refills | Status: DC | PRN

## 2016-08-16 NOTE — Patient Instructions (Addendum)
   Continue taking your Symbicort 160/4.5 - 2 puff twice daily as prescribed.  We are restarting your Spiriva Respimat. Make sure you do this daily.  Call me if you have any questions about your medications.  I'm changing your Prednisone to the following taper:  4 pills daily for 5 days  Then 3 pills daily for 5 days  Then 2 pills daily for 5 days  Then 1 pill daily for 5 days  I will see you back in October as scheduled. Call me if you need to be seen sooner.

## 2016-08-16 NOTE — Progress Notes (Signed)
Subjective:    Patient ID: Carol [name], female    DOB: 02-Jul-1932, 82 y.o.   MRN: [id number]  C.C.:  Follow-up for Severe COPD, Stage IIIA NSCLC (T2b,N2,M0) & Radiation Pneumonitis.  HPI Severe COPD:  Currently on Symbicort 160/4.5 with increased dose at last appointment. She was started on Spiriva Respimat at last appointment as well. She denies any coughing. She has intermittent wheezing. She reports she took the Spiriva but isn't sure it helped her dyspnea. She reports her dyspnea was worse last week but seems to have stabilized. She is using her rescue inhaler intermittently, not daily.   Stage IIIA NSCLC:  Diagnosed with Squamous Cell Carcinoma of left lower lobe in May 2017. Treated w/ concurrent chemoradiation w/ Carboplatin & Paclitaxel for 3 cycles ending 06/20/16. She tolerated treatment well with minimal nausea and fatigue.  Radiation Pneumonitis: Started on prednisone 40 mg daily at last appointment on 8/28 but patient only taking 20 mg at present. She reports she has pain in her left chest that is worse with movement but this seems to be improving some.   Review of Systems Reports pain is better with her Hycet. No other chest pain or pressure. She reports a "low-grade" fever last week but no chills or sweats. No nausea, emesis, or diarrhea. No rashes. Bruises easily. She did have esophagitis that seems to have improved.   No Known Allergies  Current Outpatient Prescriptions on File Prior to Visit  Medication Sig Dispense Refill  . albuterol (PROVENTIL HFA) 108 (90 Base) MCG/ACT inhaler Inhale 2 puffs into the lungs 2 (two) times daily as needed for shortness of breath. 8 g 5  . amLODipine (NORVASC) 5 MG tablet Take 1 tablet (5 mg total) by mouth daily. 90 tablet 0  . aspirin EC 81 MG tablet Take 81 mg by mouth daily.    Marland Kitchen atorvastatin (LIPITOR) 10 MG tablet Take 1 tablet (10 mg total) by mouth daily at 6 PM. Reported on 01/29/2016 30 tablet 11  . budesonide-formoterol  (SYMBICORT) 160-4.5 MCG/ACT inhaler Inhale 2 puffs into the lungs 2 (two) times daily. 1 Inhaler 5  . omeprazole (PRILOSEC) 40 MG capsule Take 1 capsule (40 mg total) by mouth 2 (two) times daily. 60 capsule 2  . predniSONE (DELTASONE) 20 MG tablet Take 2 tablets (40 mg total) by mouth daily with breakfast. 30 tablet 0  . Sennosides (EX-LAX) 15 MG TABS Take 15 mg by mouth daily as needed (For constipation.).     Marland Kitchen HYDROcodone-acetaminophen (HYCET) 7.5-325 mg/15 ml solution Take 15 mLs by mouth 4 (four) times daily as needed for moderate pain. (Patient not taking: Reported on 08/16/2016) 120 mL 0   No current facility-administered medications on file prior to visit.     Past Medical History:  Diagnosis Date  . Abdominal aortic aneurysm (AAA) ([place])   . Cataract   . COPD (chronic obstructive pulmonary disease) ([place])   . Coronary artery disease   . Encounter for antineoplastic chemotherapy 05/16/2016  . Fall   . GERD (gastroesophageal reflux disease)   . Hiatal hernia   . History of esophageal stricture   . Hypertension   . Lung cancer ([place])    squamous cell carcinoma LLL  . Osteoporosis   . Peripheral arterial disease [facility])     Past Surgical History:  Procedure Laterality Date  . CATARACT EXTRACTION    . COLONOSCOPY    . ENDOBRONCHIAL ULTRASOUND Bilateral 03/28/2016   Procedure: ENDOBRONCHIAL ULTRASOUND;  Surgeon: [name], MD;  Location: [facility] ENDOSCOPY;  Service: Cardiopulmonary;  Laterality: Bilateral;  dr. Ashok Cordia insists on mac not general anesthesia  . ESOPHAGOGASTRODUODENOSCOPY (EGD) WITH ESOPHAGEAL DILATION    . esopheal dilation  02-26-16  . EYE SURGERY      Family History  Problem Relation Age of Onset  . Breast cancer Mother 70  . Alcohol abuse Father   . Emphysema Father   . Hypertension Paternal Grandmother   . Early death Brother     MVA  . Rheumatologic disease Neg Hx     Social History   Social History  . Marital status: Divorced    Spouse name: N/A    . Number of children: 54  . Years of education: N/A   Occupational History  . Retired     Glass blower/designer, Clinical cytogeneticist   Social History Main Topics  . Smoking status: Former Smoker    Packs/day: 1.50    Years: 60.00    Types: Cigarettes    Quit date: 02/01/2016  . Smokeless tobacco: Never Used  . Alcohol use 0.0 oz/week     Comment: 1-2 glasses of wine daily  . Drug use: No  . Sexual activity: No   Other Topics Concern  . None   Social History Narrative   Divorced. Recently moved to [place] from [place], and lives with her daughter and son-in-law.   HS diploma. Retired.    Former smoker: Quit February 2017, with at least 55-pack-year history.   Occasional glass of wine, no drug use.   Caffeinated beverages, takes a daily vitamin.   Wears her seatbelt. Has dentures.   Smoke detector in the home.   Feels safe in her relationships.      [facility]:   From [place], [place]. Previously has lived in [place]. She moved to [facility] in 2013. 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 No pets currently. No bird exposure. No mold exposure.       Objective:   Physical Exam BP 130/68 (BP Location: Left Arm, Cuff Size: Normal)   Pulse 92   Ht '5\' 3"'$  (1.6 m)   Wt 108 lb 6.4 oz (49.2 kg)   SpO2 97%   BMI 19.20 kg/m  General:  Awake. Alert. No distress. Accompanied by family today. Integument:  Warm & dry. No rash on exposed skin.  Lymphatics:  No appreciated cervical or supraclavicular lymphadenoapthy. HEENT:  Moist mucus membranes. No oral ulcers. No scleral icterus.  Cardiovascular:  Regular rate. No edema. Normal S1 & S2. Pulmonary: Clear to auscultation bilaterally. Normal work of breathing on room air. Abdomen: Soft. Normal bowel sounds. Nondistended.   PFT 08/01/16: FVC 1.66 L (68%) FEV1 0.66 L (37%) FEV1/FVC 0.40 FEF 25-75 0.22 L (17%) 03/11/16: FVC 1.76 L (68%) FEV1 0.88 L (46%) FEV1/FVC 0.50 FEF 25-75 0.34 L (25%) no  bronchodilator response TLC 4.96 L (98%) RV 83% ERV 245% DLCO uncorrected 23%  6MWT 03/31/16: Walked 156 meters / Baseline Sat 92% on RA / Nadir Sat 90% on RA (stopped twice for calf pain)  IMAGING  CTA CHEST 08/03/16 (personally reviewed by me): No pulmonary embolus. Apical predominant centrilobular emphysema noted. Left lower lobe mass measuring approximately 4.0 x 3.2 cm with slight reduction in size. Small left pleural effusion. No pleural thickening or pathologic mediastinal adenopathy. Patchy adjacent opacification and groundglass near the mass itself.  MRI BRAIN W/ & W/O 04/22/16 (per radiologist): 5 mm focus of nodular enhancement along  hypothalamus eccentric to the right. No other abnormal focus of enhancement intracranially. Small vessel ischemic change in the cerebral white matter. Tiny cystic spaces in the left pons with susceptibility, presumably remote infarcts. No generalized hemorrhage.  PET CT 03/15/16 (per radiologist): Hypermetabolic posterior mediastinal lymph node between left atrium & descending thoracic aorta measuring 9 mm with maximum SUV 5.87. Increased uptake left hilar region with maximum SUV 4.47. Right hilar lymph node with a max SUV 3.6. Subcarinal lymph node with maximum SUV 3.4. Left lower lobe mass measuring 6.5 cm with maximum SUV 16.6. No other areas of metastasis identified.  CXR PA/LAT 12/10/13 (personally reviewed by me): No evidence of nodule or opacity on my review of imaging. No pleural effusion. Heart normal in size. Mediastinum normal in contour.  CT CHEST W/ 02/16/16 (personally reviewed by me): 6-1/2 cm mass in left lower lobe. No pathologic mediastinal or hilar adenopathy appreciated. No pleural effusion or thickening. No other parenchymal nodule or opacity appreciated. No pericardial effusion.  CARDIAC TTE (01/30/16): LV normal in size. EF 65-70%. Normal wall motion. Grade 1 diastolic dysfunction. LA mildly dilated. RA normal in size. RV normal in size and  function. Pulmonary artery systolic pressure 20 mmHg. Moderate aortic stenosis without regurgitation. Mild mitral regurgitation. No pulmonic regurgitation. Trivial tricuspid regurgitation. No pericardial effusion.   PATHOLOGY LLL MASS BIOPSY (04/08/16):  Squamous Cell Carcinoma EBUS FNA 11L & 7 (03/28/16): No malignant cells identified  LABS 02/10/16 CBC: 6.7/14.9/44.0/452 BMP: 137/3.9/99/24/7/0.58/84/8.9 LFT: 3.7/6.8/0.4/102/15/12    Assessment & Plan:  82 y.o. female with Stage IIIA NSCLC in her left lower lobe and severe COPD. Patient does seem to have developed a mild case of radiation pneumonitis with left chest wall pain that is responding to prednisone therapy. I am refilling her pain medication prescription today to help combat her discomfort. Despite absence of symptomatic improvement on Spiriva I do feel that given the severity of her underlying lung disease maximal bronchodilatation is essential and I did communicate this to the patient today. I instructed her to contact my office if she had any new breathing problems or questions before her next appointment.  1. Severe COPD:  Already scheduled for PFTs in October. Continuing Symbicort 160/4.5 & restarting Spiriva Respimat.  2. Stage IIIA NSCLC:  S/P Chemo/XRT. Has follow-up with Medical Oncology & Radiation Oncology.  3. Radiation Pneumonitis:  Changing patient to a Prednisone taper starting at '40mg'$  & decreasing by '10mg'$  every 5 days until she is off. 4. Health Maintenance:  Administering Pneumovax 23 today. Patient declines Influenza Vaccine. 5. Follow-up: Return to clinic in October as scheduled.  Sonia Baller Ashok Cordia, M.D. [facility] Pager:  [phone] After 3pm or if no response, call [phone] 10:41 AM 08/16/16

## 2016-08-30 ENCOUNTER — Other Ambulatory Visit: Payer: Self-pay | Admitting: Family Medicine

## 2016-08-30 ENCOUNTER — Telehealth: Payer: Self-pay | Admitting: Pulmonary Disease

## 2016-08-30 DIAGNOSIS — C3492 Malignant neoplasm of unspecified part of left bronchus or lung: Secondary | ICD-10-CM

## 2016-08-30 MED ORDER — HYDROCODONE-ACETAMINOPHEN 7.5-325 MG/15ML PO SOLN: 15.0000 mL | Freq: Four times a day (QID) | ORAL | 0 refills | Status: DC | PRN

## 2016-08-30 MED ORDER — PREDNISONE 10 MG PO TABS: 40.0000 mg | ORAL_TABLET | Freq: Every day | ORAL | 0 refills | Status: DC

## 2016-08-30 NOTE — Telephone Encounter (Signed)
Ok , per Dr. Ashok Cordia note that is ok

## 2016-08-30 NOTE — Telephone Encounter (Signed)
Restart Prednisone taper w/ '10mg'$  tablet - 4 tablets daily until she sees me in clinic. Let's see how she feels closer to her appointment to see if she can do the PFTs. Thanks.

## 2016-08-30 NOTE — Telephone Encounter (Signed)
Spoke with [name] and he states that pt finished oral pred taper given at 08/16/16 OV. Pt did improve with this. He now reports that pt is having significant back and chest pain when breathing, same as before. Pt reports pain in upper left side that occasionally radiates to the middle of her chest. Pt denies radiating pain to arm or jaw, ShOB, wheeze or chest tightness. They would like to have another oral pred taper sent in to pharmacy. He also has concerns about her being able to complete PFT at next OV 09/07/16.   JN - Please advise. Thanks!

## 2016-08-30 NOTE — Telephone Encounter (Signed)
Called spoke with pt's son. Informed him of JN's recs. He states that he can come by on 08/31/16 to pick up rx. I informed him that Durene Cal is out of the office and unable to sign printed rx. Will send message to TP as she is doc of the afternoon to see if she will sign rx in JN's absence.   TP please advise

## 2016-08-30 NOTE — Telephone Encounter (Signed)
Spoke with [name] and advised of prednisone recommendation. He is also asking for refill of the Hycet solution for pain relief and to help her sleep.   Rx for prednisone sent to pharmacy.  JN - Please advise as to Hycet refill. Thanks!

## 2016-08-30 NOTE — Telephone Encounter (Signed)
Per verbal order from TP  Okay to print rx   Rx printed, signed and placed at the front for pick up.  Called spoke with pt's daughter-in-law. Informed her that Rx is ready for pick up. She voiced understanding and had no further questions.

## 2016-08-30 NOTE — Telephone Encounter (Signed)
It's fine to give her another Rx for the Hycet. No refills on it though.

## 2016-08-31 ENCOUNTER — Other Ambulatory Visit: Payer: Self-pay | Admitting: Family Medicine

## 2016-09-06 ENCOUNTER — Telehealth: Payer: Self-pay | Admitting: Pulmonary Disease

## 2016-09-06 NOTE — Telephone Encounter (Signed)
Pt using Hycet for pain. Pt having a lot of pain with inhalation. Pt son-in-law [name] states that he does not feel that she can do that PFT d/t the pain. There is question on whether or not to cancel the PFT for 09/07/06. Pt is scheduled to see JN on 09/07/16 also.  Please advise Dr Ashok Cordia. Thanks.

## 2016-09-06 NOTE — Telephone Encounter (Signed)
We can hold off on her PFTs.

## 2016-09-06 NOTE — Telephone Encounter (Signed)
I spoke with [name] and notified ok to hodl off on PFT  Appt was cancelled

## 2016-09-07 ENCOUNTER — Ambulatory Visit (INDEPENDENT_AMBULATORY_CARE_PROVIDER_SITE_OTHER)
Admission: RE | Admit: 2016-09-07 | Discharge: 2016-09-07 | Disposition: A | Discharge status: Home / Self Care | Payer: Medicare Other | Source: Ambulatory Visit | Attending: Pulmonary Disease | Admitting: Pulmonary Disease

## 2016-09-07 ENCOUNTER — Ambulatory Visit (INDEPENDENT_AMBULATORY_CARE_PROVIDER_SITE_OTHER): Payer: Medicare Other | Admitting: Pulmonary Disease

## 2016-09-07 ENCOUNTER — Encounter: Payer: Self-pay | Admitting: Pulmonary Disease

## 2016-09-07 VITALS — BP 132/66 | HR 73 | Ht 63.0 in | Wt 111.0 lb

## 2016-09-07 DIAGNOSIS — J449 Chronic obstructive pulmonary disease, unspecified: Secondary | ICD-10-CM

## 2016-09-07 DIAGNOSIS — C3490 Malignant neoplasm of unspecified part of unspecified bronchus or lung: Secondary | ICD-10-CM

## 2016-09-07 DIAGNOSIS — J700 Acute pulmonary manifestations due to radiation: Secondary | ICD-10-CM

## 2016-09-07 DIAGNOSIS — R079 Chest pain, unspecified: Secondary | ICD-10-CM | POA: Diagnosis not present

## 2016-09-07 MED ORDER — OXYCODONE HCL 5 MG PO CAPS: 5.0000 mg | ORAL_CAPSULE | ORAL | 0 refills | Status: DC | PRN

## 2016-09-07 NOTE — Patient Instructions (Signed)
   Start taking Prednisone '40mg'$  (4 tablets of '10mg'$ ) daily x4 days then decrease by 1 tablet every 4 days until they are gone. Call me if you need a refill or notice that your pain gets better or worse with a particular dose.  I am referring you to a pain specialist to help manage your pain medications.  Pay attention to your bowel movements on the pain medication. You may need to start using Senna 1-2 times daily to keep your bowels regular.  I will see you back in 8 weeks or sooner if needed.   IMAGING 1. CXR PA/LAT TODAY

## 2016-09-07 NOTE — Progress Notes (Signed)
Subjective:    Patient ID: Carol [name], female    DOB: Dec 31, 1931, 82 y.o.   MRN: [id number]  C.C.:  Follow-up for Severe COPD, Stage IIIA NSCLC (T2b,N2,M0) & Radiation Pneumonitis.  HPI Severe COPD:  On Symbicort 160/4.5 & Spiriva. Unable to perform PFTs due to chest pain. No coughing or wheezing. No exacerbations since last appointment. She has used her rescue inhaler more frequently the last couple of weeks.   Stage IIIA NSCLC:  Diagnosed with Squamous Cell Carcinoma of left lower lobe in May 2017. Treated w/ concurrent chemoradiation w/ Carboplatin & Paclitaxel for 3 cycles ending 06/20/16. Denies any headaches or acute vision changes.   Radiation Pneumonitis: Continued on Prednisone taper at last appointment. However, patient had more back & chest pain with breathing that was reported on 9/26 and was restarted on Prednisone '40mg'$  daily until she came today. Patient's Hycet was refilled.  Chest Pain:  Pain is primarily in the center of her chest. The pain is worse with breathing and with movement. She has pain even with getting dressed. She reports that the Hycet seems to give her some temporary relief. She reports her pain initially improved on her higher dose of Prednisone.   Review of Systems No fever, chills, or sweats. No syncope or near syncope. She has had some unsteady gait and is using her walker. No adenopathy in her neck, groin, or axilla. She reports only infrequent abdominal pain. No constipation.   No Known Allergies  Current Outpatient Prescriptions on File Prior to Visit  Medication Sig Dispense Refill  . albuterol (PROVENTIL HFA) 108 (90 Base) MCG/ACT inhaler Inhale 2 puffs into the lungs 2 (two) times daily as needed for shortness of breath. 8 g 5  . amLODipine (NORVASC) 5 MG tablet Take 1 tablet (5 mg total) by mouth daily. 90 tablet 0  . aspirin EC 81 MG tablet Take 81 mg by mouth daily.    Marland Kitchen atorvastatin (LIPITOR) 10 MG tablet Take 1 tablet (10 mg total) by mouth  daily at 6 PM. Reported on 01/29/2016 30 tablet 11  . budesonide-formoterol (SYMBICORT) 160-4.5 MCG/ACT inhaler Inhale 2 puffs into the lungs 2 (two) times daily. 1 Inhaler 5  . HYDROcodone-acetaminophen (HYCET) 7.5-325 mg/15 ml solution Take 15 mLs by mouth 4 (four) times daily as needed for moderate pain. 120 mL 0  . omeprazole (PRILOSEC) 40 MG capsule Take 1 capsule (40 mg total) by mouth 2 (two) times daily. 60 capsule 2  . predniSONE (DELTASONE) 10 MG tablet Take 4 tablets (40 mg total) by mouth daily with breakfast. 120 tablet 0  . Sennosides (EX-LAX) 15 MG TABS Take 15 mg by mouth daily as needed (For constipation.).     Marland Kitchen Tiotropium Bromide Monohydrate (SPIRIVA RESPIMAT) 2.5 MCG/ACT AERS Inhale 2 puffs into the lungs daily. 1 Inhaler 6   No current facility-administered medications on file prior to visit.     Past Medical History:  Diagnosis Date  . Abdominal aortic aneurysm (AAA) ([place])   . Cataract   . COPD (chronic obstructive pulmonary disease) ([place])   . Coronary artery disease   . Encounter for antineoplastic chemotherapy 05/16/2016  . Fall   . GERD (gastroesophageal reflux disease)   . Hiatal hernia   . History of esophageal stricture   . Hypertension   . Lung cancer ([place])    squamous cell carcinoma LLL  . Osteoporosis   . Peripheral arterial disease [facility])     Past Surgical History:  Procedure Laterality Date  .  CATARACT EXTRACTION    . COLONOSCOPY    . ENDOBRONCHIAL ULTRASOUND Bilateral 03/28/2016   Procedure: ENDOBRONCHIAL ULTRASOUND;  Surgeon: [name], MD;  Location: [facility] ENDOSCOPY;  Service: Cardiopulmonary;  Laterality: Bilateral;  dr. Ashok Cordia insists on mac not general anesthesia  . ESOPHAGOGASTRODUODENOSCOPY (EGD) WITH ESOPHAGEAL DILATION    . esopheal dilation  02-26-16  . EYE SURGERY      Family History  Problem Relation Age of Onset  . Breast cancer Mother 41  . Alcohol abuse Father   . Emphysema Father   . Hypertension Paternal Grandmother   .  Early death Brother     MVA  . Rheumatologic disease Neg Hx     Social History   Social History  . Marital status: Divorced    Spouse name: N/A  . Number of children: 30  . Years of education: N/A   Occupational History  . Retired     Glass blower/designer, Clinical cytogeneticist   Social History Main Topics  . Smoking status: Former Smoker    Packs/day: 1.50    Years: 60.00    Types: Cigarettes    Quit date: 02/01/2016  . Smokeless tobacco: Never Used  . Alcohol use 0.0 oz/week     Comment: 1-2 glasses of wine daily  . Drug use: No  . Sexual activity: No   Other Topics Concern  . None   Social History Narrative   Divorced. Recently moved to [place] from [place], and lives with her daughter and son-in-law.   HS diploma. Retired.    Former smoker: Quit February 2017, with at least 55-pack-year history.   Occasional glass of wine, no drug use.   Caffeinated beverages, takes a daily vitamin.   Wears her seatbelt. Has dentures.   Smoke detector in the home.   Feels safe in her relationships.      [facility]:   From [place], [place]. Previously has lived in [place]. She moved to [facility] in 2013. 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 No pets currently. No bird exposure. No mold exposure.       Objective:   Physical Exam BP 132/66 (BP Location: Right Arm, Cuff Size: Normal)   Pulse 73   Ht '5\' 3"'$  (1.6 m)   Wt 111 lb (50.3 kg)   SpO2 94% Comment: 83% on finger probe  BMI 19.66 kg/m  General:  Awake. Alert. No distress. Accompanied by her son-in-law today. Integument:  Warm & dry. No rash on exposed skin.  Lymphatics:  No appreciated cervical or supraclavicular lymphadenoapthy. HEENT:  Moist mucus membranes. No oral ulcers. No scleral injection.  Cardiovascular:  Regular rate. No edema. Normal S1 & S2. Pulmonary: Clear to auscultation bilaterally. Good aeration. Normal work of breathing without accessory muscle use on room  air. Abdomen: Soft. Normal bowel sounds. Nondistended.  Musculoskeletal: No chest wall deformity or pain with palpation of the thorax. Neurological: Cranial nerves grossly intact. Alert and oriented 4. Moving all 4 extremities equally.  PFT 08/01/16: FVC 1.66 L (68%) FEV1 0.66 L (37%) FEV1/FVC 0.40 FEF 25-75 0.22 L (17%) 03/11/16: FVC 1.76 L (68%) FEV1 0.88 L (46%) FEV1/FVC 0.50 FEF 25-75 0.34 L (25%) no bronchodilator response TLC 4.96 L (98%) RV 83% ERV 245% DLCO uncorrected 23%  6MWT 03/31/16: Walked 156 meters / Baseline Sat 92% on RA / Nadir Sat 90% on RA (stopped twice for calf pain)  IMAGING  CTA CHEST 08/03/16 (previously reviewed  by me): No pulmonary embolus. Apical predominant centrilobular emphysema noted. Left lower lobe mass measuring approximately 4.0 x 3.2 cm with slight reduction in size. Small left pleural effusion. No pleural thickening or pathologic mediastinal adenopathy. Patchy adjacent opacification and groundglass near the mass itself.  MRI BRAIN W/ & W/O 04/22/16 (per radiologist): 5 mm focus of nodular enhancement along hypothalamus eccentric to the right. No other abnormal focus of enhancement intracranially. Small vessel ischemic change in the cerebral white matter. Tiny cystic spaces in the left pons with susceptibility, presumably remote infarcts. No generalized hemorrhage.  PET CT 03/15/16 (per radiologist): Hypermetabolic posterior mediastinal lymph node between left atrium & descending thoracic aorta measuring 9 mm with maximum SUV 5.87. Increased uptake left hilar region with maximum SUV 4.47. Right hilar lymph node with a max SUV 3.6. Subcarinal lymph node with maximum SUV 3.4. Left lower lobe mass measuring 6.5 cm with maximum SUV 16.6. No other areas of metastasis identified.  CXR PA/LAT 12/10/13 (previously reviewed by me): No evidence of nodule or opacity on my review of imaging. No pleural effusion. Heart normal in size. Mediastinum normal in contour.  CT  CHEST W/ 02/16/16 (previously reviewed by me): 6-1/2 cm mass in left lower lobe. No pathologic mediastinal or hilar adenopathy appreciated. No pleural effusion or thickening. No other parenchymal nodule or opacity appreciated. No pericardial effusion.  CARDIAC TTE (01/30/16): LV normal in size. EF 65-70%. Normal wall motion. Grade 1 diastolic dysfunction. LA mildly dilated. RA normal in size. RV normal in size and function. Pulmonary artery systolic pressure 20 mmHg. Moderate aortic stenosis without regurgitation. Mild mitral regurgitation. No pulmonic regurgitation. Trivial tricuspid regurgitation. No pericardial effusion.   PATHOLOGY LLL MASS BIOPSY (04/08/16):  Squamous Cell Carcinoma EBUS FNA 11L & 7 (03/28/16): No malignant cells identified  LABS 02/10/16 CBC: 6.7/14.9/44.0/452 BMP: 137/3.9/99/24/7/0.58/84/8.9 LFT: 3.7/6.8/0.4/102/15/12    Assessment & Plan:  82 y.o. female with Stage IIIA NSCLC in her left lower lobe and severe COPD. It's possible patient's chest wall pain is due to pleurisy from her radiation pneumonitis. I do question whether or not she may have a pathologic fracture and I'm ordering chest imaging today. Her current dose of liquid hydrocodone seems insufficient for pain control and I am prescribing her oxycodone with specific caution as to the potential for respiratory depression and constipation with excessive narcotic use. I'm also referring the patient to a pain specialist for further treatment. She does have follow-up with radiation and medical oncology. I instructed the patient to contact my office if she had any new breathing problems or concerns before her next appointment.  1. Chest Pain:  Checking chest x-ray PA/LAT today. Possibly secondary to radiation pneumonitis. Continuing Hycet when necessary. Prescribing oxycodone 5 mg by mouth every 4 hours when necessary. Referring the patient to pain management for further treatment. 2. Severe COPD:  Continuing Symbicort &  Spiriva. No changes. 3. Stage IIIA NSCLC:  S/P Chemo/XRT. Has follow-up with Medical Oncology & Radiation Oncology.  4. Radiation Pneumonitis: Patient to increase to 40 mg daily on prednisone and then taper by 10 mg every 4 days thereafter. Checking chest x-ray PA/LAT today. 5. Health Maintenance:  S/P Pnemovax 27 August 2016. Previously declined Influenza Vaccine. 6. Follow-up: Return to clinic in 8 weeks or sooner if needed.  Sonia Baller Ashok Cordia, M.D. [facility] Pager:  [phone] After 3pm or if no response, call [phone] 1:56 PM 09/07/16

## 2016-09-09 ENCOUNTER — Telehealth: Payer: Self-pay | Admitting: Pulmonary Disease

## 2016-09-09 ENCOUNTER — Other Ambulatory Visit: Payer: Self-pay

## 2016-09-09 DIAGNOSIS — Z01812 Encounter for preprocedural laboratory examination: Secondary | ICD-10-CM

## 2016-09-09 DIAGNOSIS — M4854XA Collapsed vertebra, not elsewhere classified, thoracic region, initial encounter for fracture: Secondary | ICD-10-CM

## 2016-09-09 NOTE — Telephone Encounter (Signed)
Carol [name] called and stated that the pt is scheduled for MRI on 10/11 and will need something to help her with claustrophobia. JN please advise of what can be sent in to the pharmacy.  thanks

## 2016-09-09 NOTE — Telephone Encounter (Signed)
Ativan 0.'5mg'$  tablet - 1 tablet 30 minutes prior to MRI & may take second tablet if still too anxious at the time of the MRI. #2 No refills.

## 2016-09-09 NOTE — Addendum Note (Signed)
Addended by: Len Blalock on: 09/09/2016 02:30 PM   Modules accepted: Orders

## 2016-09-09 NOTE — Telephone Encounter (Signed)
Mri ordered.  Nothing further needed.

## 2016-09-09 NOTE — Telephone Encounter (Signed)
[name] called back about the pt and he is aware of cxr results per JN.  He stated that the pt does not have any allergies to IV dye and does well with MRI's since she has had numerous recently.  He is aware that we will get this ordered for her.  Will forward to [place].

## 2016-09-10 ENCOUNTER — Emergency Department (HOSPITAL_COMMUNITY): Payer: Medicare Other

## 2016-09-10 ENCOUNTER — Inpatient Hospital Stay (HOSPITAL_COMMUNITY)
Admission: EM | Admit: 2016-09-10 | Discharge: 2016-09-15 | DRG: 205 | Disposition: A | Discharge status: Home Health | Payer: Medicare Other | Attending: Family Medicine | Admitting: Family Medicine

## 2016-09-10 ENCOUNTER — Encounter (HOSPITAL_COMMUNITY): Payer: Self-pay | Admitting: Emergency Medicine

## 2016-09-10 DIAGNOSIS — I5032 Chronic diastolic (congestive) heart failure: Secondary | ICD-10-CM | POA: Diagnosis present

## 2016-09-10 DIAGNOSIS — J9601 Acute respiratory failure with hypoxia: Secondary | ICD-10-CM | POA: Diagnosis not present

## 2016-09-10 DIAGNOSIS — E440 Moderate protein-calorie malnutrition: Secondary | ICD-10-CM | POA: Diagnosis not present

## 2016-09-10 DIAGNOSIS — Y842 Radiological procedure and radiotherapy as the cause of abnormal reaction of the patient, or of later complication, without mention of misadventure at the time of the procedure: Secondary | ICD-10-CM | POA: Diagnosis present

## 2016-09-10 DIAGNOSIS — Z7982 Long term (current) use of aspirin: Secondary | ICD-10-CM | POA: Diagnosis not present

## 2016-09-10 DIAGNOSIS — I2510 Atherosclerotic heart disease of native coronary artery without angina pectoris: Secondary | ICD-10-CM | POA: Diagnosis present

## 2016-09-10 DIAGNOSIS — I714 Abdominal aortic aneurysm, without rupture: Secondary | ICD-10-CM | POA: Diagnosis present

## 2016-09-10 DIAGNOSIS — C3492 Malignant neoplasm of unspecified part of left bronchus or lung: Secondary | ICD-10-CM | POA: Diagnosis not present

## 2016-09-10 DIAGNOSIS — I4891 Unspecified atrial fibrillation: Secondary | ICD-10-CM | POA: Diagnosis present

## 2016-09-10 DIAGNOSIS — J700 Acute pulmonary manifestations due to radiation: Secondary | ICD-10-CM | POA: Diagnosis not present

## 2016-09-10 DIAGNOSIS — I700 Atherosclerosis of aorta: Secondary | ICD-10-CM | POA: Diagnosis present

## 2016-09-10 DIAGNOSIS — J189 Pneumonia, unspecified organism: Secondary | ICD-10-CM | POA: Diagnosis not present

## 2016-09-10 DIAGNOSIS — I10 Essential (primary) hypertension: Secondary | ICD-10-CM | POA: Diagnosis not present

## 2016-09-10 DIAGNOSIS — Z825 Family history of asthma and other chronic lower respiratory diseases: Secondary | ICD-10-CM

## 2016-09-10 DIAGNOSIS — Z681 Body mass index (BMI) 19 or less, adult: Secondary | ICD-10-CM

## 2016-09-10 DIAGNOSIS — I739 Peripheral vascular disease, unspecified: Secondary | ICD-10-CM | POA: Diagnosis present

## 2016-09-10 DIAGNOSIS — I110 Hypertensive heart disease with heart failure: Secondary | ICD-10-CM | POA: Diagnosis present

## 2016-09-10 DIAGNOSIS — R1310 Dysphagia, unspecified: Secondary | ICD-10-CM | POA: Diagnosis not present

## 2016-09-10 DIAGNOSIS — C3432 Malignant neoplasm of lower lobe, left bronchus or lung: Secondary | ICD-10-CM | POA: Diagnosis present

## 2016-09-10 DIAGNOSIS — M810 Age-related osteoporosis without current pathological fracture: Secondary | ICD-10-CM | POA: Diagnosis present

## 2016-09-10 DIAGNOSIS — Z7951 Long term (current) use of inhaled steroids: Secondary | ICD-10-CM | POA: Diagnosis not present

## 2016-09-10 DIAGNOSIS — Z9221 Personal history of antineoplastic chemotherapy: Secondary | ICD-10-CM

## 2016-09-10 DIAGNOSIS — K449 Diaphragmatic hernia without obstruction or gangrene: Secondary | ICD-10-CM | POA: Diagnosis present

## 2016-09-10 DIAGNOSIS — Z8719 Personal history of other diseases of the digestive system: Secondary | ICD-10-CM | POA: Diagnosis not present

## 2016-09-10 DIAGNOSIS — R1312 Dysphagia, oropharyngeal phase: Secondary | ICD-10-CM

## 2016-09-10 DIAGNOSIS — Z8249 Family history of ischemic heart disease and other diseases of the circulatory system: Secondary | ICD-10-CM

## 2016-09-10 DIAGNOSIS — Z23 Encounter for immunization: Secondary | ICD-10-CM | POA: Diagnosis not present

## 2016-09-10 DIAGNOSIS — Z66 Do not resuscitate: Secondary | ICD-10-CM | POA: Diagnosis present

## 2016-09-10 DIAGNOSIS — Z79899 Other long term (current) drug therapy: Secondary | ICD-10-CM

## 2016-09-10 DIAGNOSIS — R079 Chest pain, unspecified: Secondary | ICD-10-CM

## 2016-09-10 DIAGNOSIS — R0902 Hypoxemia: Secondary | ICD-10-CM | POA: Diagnosis not present

## 2016-09-10 DIAGNOSIS — R918 Other nonspecific abnormal finding of lung field: Secondary | ICD-10-CM | POA: Diagnosis not present

## 2016-09-10 DIAGNOSIS — Z87891 Personal history of nicotine dependence: Secondary | ICD-10-CM

## 2016-09-10 DIAGNOSIS — K219 Gastro-esophageal reflux disease without esophagitis: Secondary | ICD-10-CM | POA: Diagnosis present

## 2016-09-10 DIAGNOSIS — Z923 Personal history of irradiation: Secondary | ICD-10-CM | POA: Diagnosis not present

## 2016-09-10 DIAGNOSIS — E43 Unspecified severe protein-calorie malnutrition: Secondary | ICD-10-CM | POA: Insufficient documentation

## 2016-09-10 DIAGNOSIS — J441 Chronic obstructive pulmonary disease with (acute) exacerbation: Secondary | ICD-10-CM | POA: Diagnosis present

## 2016-09-10 DIAGNOSIS — E876 Hypokalemia: Secondary | ICD-10-CM | POA: Diagnosis present

## 2016-09-10 DIAGNOSIS — Z7952 Long term (current) use of systemic steroids: Secondary | ICD-10-CM

## 2016-09-10 DIAGNOSIS — I350 Nonrheumatic aortic (valve) stenosis: Secondary | ICD-10-CM | POA: Diagnosis present

## 2016-09-10 LAB — I-STAT CG4 LACTIC ACID, ED
LACTIC ACID, VENOUS: 1.47 mmol/L (ref 0.5–1.9)
LACTIC ACID, VENOUS: 4.27 mmol/L — AB (ref 0.5–1.9)

## 2016-09-10 LAB — CBC
HEMATOCRIT: 39.2 % (ref 36.0–46.0)
HEMOGLOBIN: 13 g/dL (ref 12.0–15.0)
MCH: 29 pg (ref 26.0–34.0)
MCHC: 33.2 g/dL (ref 30.0–36.0)
MCV: 87.3 fL (ref 78.0–100.0)
Platelets: 231 10*3/uL (ref 150–400)
RBC: 4.49 MIL/uL (ref 3.87–5.11)
RDW: 18.5 % — ABNORMAL HIGH (ref 11.5–15.5)
WBC: 5.9 10*3/uL (ref 4.0–10.5)

## 2016-09-10 LAB — BASIC METABOLIC PANEL
ANION GAP: 10 (ref 5–15)
BUN: 19 mg/dL (ref 6–20)
CO2: 26 mmol/L (ref 22–32)
Calcium: 8.4 mg/dL — ABNORMAL LOW (ref 8.9–10.3)
Chloride: 102 mmol/L (ref 101–111)
Creatinine, Ser: 0.58 mg/dL (ref 0.44–1.00)
GFR calc Af Amer: >60 mL/min (ref >60)
GFR calc non Af Amer: >60 mL/min (ref >60)
Glucose, Bld: 134 mg/dL — ABNORMAL HIGH (ref 65–99)
POTASSIUM: 3.6 mmol/L (ref 3.5–5.1)
SODIUM: 138 mmol/L (ref 135–145)

## 2016-09-10 LAB — I-STAT TROPONIN, ED: TROPONIN I, POC: 0.03 ng/mL (ref 0.00–0.08)

## 2016-09-10 LAB — MRSA PCR SCREENING: MRSA BY PCR: NEGATIVE

## 2016-09-10 MED ORDER — ORAL CARE MOUTH RINSE
15.0000 mL | Freq: Two times a day (BID) | OROMUCOSAL | Status: DC
  Administered 2016-09-11 – 2016-09-15 (×8): 15 mL via OROMUCOSAL

## 2016-09-10 MED ORDER — INFLUENZA VAC SPLIT QUAD 0.5 ML IM SUSY
0.5000 mL | PREFILLED_SYRINGE | INTRAMUSCULAR | Status: DC
Start: 2016-09-11 — End: 2016-09-15
  Filled 2016-09-10: qty 0.5

## 2016-09-10 MED ORDER — ONDANSETRON HCL 4 MG/2ML IJ SOLN: 4.0000 mg | Freq: Four times a day (QID) | INTRAMUSCULAR | Status: DC | PRN

## 2016-09-10 MED ORDER — PIPERACILLIN-TAZOBACTAM 3.375 G IVPB
3.3750 g | Freq: Three times a day (TID) | INTRAVENOUS | Status: DC
  Administered 2016-09-11 – 2016-09-13 (×8): 3.375 g via INTRAVENOUS
  Filled 2016-09-10 (×8): qty 50

## 2016-09-10 MED ORDER — IOPAMIDOL (ISOVUE-370) INJECTION 76%
100.0000 mL | Freq: Once | INTRAVENOUS | Status: AC | PRN
  Administered 2016-09-10: 100 mL via INTRAVENOUS

## 2016-09-10 MED ORDER — IPRATROPIUM BROMIDE 0.02 % IN SOLN
0.5000 mg | Freq: Four times a day (QID) | RESPIRATORY_TRACT | Status: DC
  Administered 2016-09-10 – 2016-09-12 (×5): 0.5 mg via RESPIRATORY_TRACT
  Filled 2016-09-10 (×5): qty 2.5

## 2016-09-10 MED ORDER — ENSURE ENLIVE PO LIQD
237.0000 mL | Freq: Two times a day (BID) | ORAL | Status: DC
  Administered 2016-09-11 – 2016-09-15 (×9): 237 mL via ORAL

## 2016-09-10 MED ORDER — ONDANSETRON HCL 4 MG PO TABS: 4.0000 mg | ORAL_TABLET | Freq: Four times a day (QID) | ORAL | Status: DC | PRN

## 2016-09-10 MED ORDER — METHYLPREDNISOLONE SODIUM SUCC 125 MG IJ SOLR
125.0000 mg | Freq: Once | INTRAMUSCULAR | Status: AC
  Administered 2016-09-10: 125 mg via INTRAVENOUS
  Filled 2016-09-10: qty 2

## 2016-09-10 MED ORDER — VANCOMYCIN HCL IN DEXTROSE 1-5 GM/200ML-% IV SOLN
1000.0000 mg | Freq: Once | INTRAVENOUS | Status: AC
  Administered 2016-09-10: 1000 mg via INTRAVENOUS
  Filled 2016-09-10: qty 200

## 2016-09-10 MED ORDER — PANTOPRAZOLE SODIUM 40 MG PO TBEC
40.0000 mg | DELAYED_RELEASE_TABLET | Freq: Every day | ORAL | Status: DC
  Administered 2016-09-11 – 2016-09-15 (×5): 40 mg via ORAL
  Filled 2016-09-10 (×5): qty 1

## 2016-09-10 MED ORDER — ASPIRIN EC 81 MG PO TBEC
81.0000 mg | DELAYED_RELEASE_TABLET | Freq: Every day | ORAL | Status: DC
  Administered 2016-09-11 – 2016-09-15 (×5): 81 mg via ORAL
  Filled 2016-09-10 (×5): qty 1

## 2016-09-10 MED ORDER — SODIUM CHLORIDE 0.9% FLUSH
3.0000 mL | Freq: Two times a day (BID) | INTRAVENOUS | Status: DC
  Administered 2016-09-10 – 2016-09-15 (×9): 3 mL via INTRAVENOUS

## 2016-09-10 MED ORDER — ALBUTEROL SULFATE (2.5 MG/3ML) 0.083% IN NEBU
5.0000 mg | INHALATION_SOLUTION | Freq: Once | RESPIRATORY_TRACT | Status: AC
  Administered 2016-09-10: 5 mg via RESPIRATORY_TRACT
  Filled 2016-09-10: qty 6

## 2016-09-10 MED ORDER — ATORVASTATIN CALCIUM 10 MG PO TABS
10.0000 mg | ORAL_TABLET | Freq: Every day | ORAL | Status: DC
  Administered 2016-09-11 – 2016-09-15 (×5): 10 mg via ORAL
  Filled 2016-09-10 (×5): qty 1

## 2016-09-10 MED ORDER — DEXTROSE 5 % IV SOLN
3.3750 g | Freq: Once | INTRAVENOUS | Status: AC
  Administered 2016-09-10: 3.375 g via INTRAVENOUS
  Filled 2016-09-10: qty 3.38

## 2016-09-10 MED ORDER — GUAIFENESIN ER 600 MG PO TB12
600.0000 mg | ORAL_TABLET | Freq: Two times a day (BID) | ORAL | Status: DC
  Administered 2016-09-10 – 2016-09-15 (×10): 600 mg via ORAL
  Filled 2016-09-10 (×10): qty 1

## 2016-09-10 MED ORDER — SODIUM CHLORIDE 0.9% FLUSH
3.0000 mL | INTRAVENOUS | Status: DC | PRN
Start: 2016-09-10 — End: 2016-09-15

## 2016-09-10 MED ORDER — SODIUM CHLORIDE 0.9 % IV BOLUS (SEPSIS)
1000.0000 mL | Freq: Once | INTRAVENOUS | Status: AC
  Administered 2016-09-10: 1000 mL via INTRAVENOUS

## 2016-09-10 MED ORDER — FUROSEMIDE 20 MG PO TABS
20.0000 mg | ORAL_TABLET | Freq: Every day | ORAL | Status: DC
  Administered 2016-09-10 – 2016-09-15 (×6): 20 mg via ORAL
  Filled 2016-09-10 (×6): qty 1

## 2016-09-10 MED ORDER — IPRATROPIUM-ALBUTEROL 0.5-2.5 (3) MG/3ML IN SOLN
3.0000 mL | Freq: Once | RESPIRATORY_TRACT | Status: AC
  Administered 2016-09-10: 3 mL via RESPIRATORY_TRACT
  Filled 2016-09-10: qty 3

## 2016-09-10 MED ORDER — SODIUM CHLORIDE 0.9 % IV SOLN: 250.0000 mL | INTRAVENOUS | Status: DC | PRN

## 2016-09-10 MED ORDER — FENTANYL CITRATE (PF) 100 MCG/2ML IJ SOLN
100.0000 ug | Freq: Once | INTRAMUSCULAR | Status: AC
  Administered 2016-09-10: 100 ug via INTRAVENOUS
  Filled 2016-09-10: qty 2

## 2016-09-10 MED ORDER — MORPHINE SULFATE (PF) 2 MG/ML IV SOLN
2.0000 mg | INTRAVENOUS | Status: DC | PRN
Start: 2016-09-10 — End: 2016-09-15
  Administered 2016-09-10 – 2016-09-15 (×9): 2 mg via INTRAVENOUS
  Filled 2016-09-10 (×11): qty 1

## 2016-09-10 MED ORDER — ENOXAPARIN SODIUM 40 MG/0.4ML ~~LOC~~ SOLN
40.0000 mg | SUBCUTANEOUS | Status: DC
  Administered 2016-09-10 – 2016-09-14 (×5): 40 mg via SUBCUTANEOUS
  Filled 2016-09-10 (×5): qty 0.4

## 2016-09-10 MED ORDER — VANCOMYCIN HCL IN DEXTROSE 1-5 GM/200ML-% IV SOLN
1000.0000 mg | INTRAVENOUS | Status: DC
  Administered 2016-09-11: 1000 mg via INTRAVENOUS
  Filled 2016-09-10: qty 200

## 2016-09-10 MED ORDER — DILTIAZEM HCL-DEXTROSE 100-5 MG/100ML-% IV SOLN (PREMIX)
5.0000 mg/h | INTRAVENOUS | Status: DC
  Administered 2016-09-10: 5 mg/h via INTRAVENOUS
  Administered 2016-09-11: 7.5 mg/h via INTRAVENOUS
  Administered 2016-09-11 – 2016-09-12 (×2): 10 mg/h via INTRAVENOUS
  Filled 2016-09-10 (×5): qty 100

## 2016-09-10 MED ORDER — LEVALBUTEROL HCL 0.63 MG/3ML IN NEBU
0.6300 mg | INHALATION_SOLUTION | Freq: Four times a day (QID) | RESPIRATORY_TRACT | Status: DC
  Administered 2016-09-10 – 2016-09-12 (×5): 0.63 mg via RESPIRATORY_TRACT
  Filled 2016-09-10 (×5): qty 3

## 2016-09-10 MED ORDER — METHYLPREDNISOLONE SODIUM SUCC 125 MG IJ SOLR
60.0000 mg | Freq: Four times a day (QID) | INTRAMUSCULAR | Status: DC
  Administered 2016-09-10 – 2016-09-12 (×6): 60 mg via INTRAVENOUS
  Filled 2016-09-10 (×6): qty 2

## 2016-09-10 MED ORDER — HYDRALAZINE HCL 25 MG PO TABS
25.0000 mg | ORAL_TABLET | Freq: Four times a day (QID) | ORAL | Status: DC | PRN
Start: 2016-09-10 — End: 2016-09-11

## 2016-09-10 NOTE — ED Notes (Signed)
Respiratory notified of nebulizer.

## 2016-09-10 NOTE — ED Notes (Signed)
Patient transported to CT 

## 2016-09-10 NOTE — ED Notes (Signed)
Patient given water to drink per MD request

## 2016-09-10 NOTE — H&P (Signed)
TRH H&P    Patient Demographics:    Carol [name], is a 82 y.o. female  MRN: [id number]  DOB - 12-11-1931  Admit Date - 09/10/2016  Referring MD/NP/PA: Dr. Dolly Rias  Outpatient Primary MD for the patient is [name], DO  Patient coming from: Home  Chief Complaint  Patient presents with  . Shortness of Breath  . Chest Pain      HPI:    Carol [name]  is a 82 y.o. female, With history of non-small cell lung cancer, esophageal stricture status post dilation, COPD, diastolic heart failure, atrial fibrillation, hypertension who came to the hospital for worsening shortness of breath and chest pain ongoing for past 1 week. Patient underwent chemotherapy and radiation for left lung mass, hard last radiation treatment was in July. Patient was diagnosed with radiation pneumonitis and was prescribed steroids, and the dose of steroid was tapered to 20 mg daily she started having symptoms of pain and shortness of breath. She denies fever, has been coughing, but unable to cough up phlegm.  In the ED CT chest shows ground glass opacity near the left lower lobe mass, inflammatory versus infectious. Patient empirically started on vancomycin and Zosyn for possible healthcare associated pneumonia. Also found to be in A. fib with RVR, uncontrolled hypertension. She denies nausea vomiting or diarrhea. No dysuria. Denies odynophagia    Review of systems:     A full 10 point Review of Systems was done, except as stated above, all other Review of Systems were negative.   With Past History of the following :    Past Medical History:  Diagnosis Date  . Abdominal aortic aneurysm (AAA) ([place])   . Cataract   . COPD (chronic obstructive pulmonary disease) ([place])   . Coronary artery disease   . Encounter for antineoplastic chemotherapy 05/16/2016  . Fall   . GERD (gastroesophageal reflux disease)   . Hiatal hernia   .  History of esophageal stricture   . Hypertension   . Lung cancer ([place])    squamous cell carcinoma LLL  . Osteoporosis   . Peripheral arterial disease [facility])       Past Surgical History:  Procedure Laterality Date  . CATARACT EXTRACTION    . COLONOSCOPY    . ENDOBRONCHIAL ULTRASOUND Bilateral 03/28/2016   Procedure: ENDOBRONCHIAL ULTRASOUND;  Surgeon: [name], MD;  Location: [facility] ENDOSCOPY;  Service: Cardiopulmonary;  Laterality: Bilateral;  dr. Ashok Cordia insists on mac not general anesthesia  . ESOPHAGOGASTRODUODENOSCOPY (EGD) WITH ESOPHAGEAL DILATION    . esopheal dilation  02-26-16  . EYE SURGERY        Social History:      Social History  Substance Use Topics  . Smoking status: Former Smoker    Packs/day: 1.50    Years: 60.00    Types: Cigarettes    Quit date: 02/01/2016  . Smokeless tobacco: Never Used  . Alcohol use 0.0 oz/week     Comment: 1-2 glasses of wine daily       Family History :  Family History  Problem Relation Age of Onset  . Breast cancer Mother 81  . Alcohol abuse Father   . Emphysema Father   . Hypertension Paternal Grandmother   . Early death Brother     MVA  . Rheumatologic disease Neg Hx      Home Medications:   Prior to Admission medications   Medication Sig Start Date End Date Taking? Authorizing Provider  albuterol (PROVENTIL HFA) 108 (90 Base) MCG/ACT inhaler Inhale 2 puffs into the lungs 2 (two) times daily as needed for shortness of breath. 02/10/16 02/09/17  Renee A Kuneff, DO  amLODipine (NORVASC) 5 MG tablet Take 1 tablet (5 mg total) by mouth daily. 07/05/16 07/05/17  Renee A Kuneff, DO  aspirin EC 81 MG tablet Take 81 mg by mouth daily.    Historical Provider, MD  atorvastatin (LIPITOR) 10 MG tablet Take 1 tablet (10 mg total) by mouth daily at 6 PM. Reported on 01/29/2016 02/19/16   Minus Breeding, MD  budesonide-formoterol [facility]) 160-4.5 MCG/ACT inhaler Inhale 2 puffs into the lungs 2 (two) times daily. 08/01/16   Tammy S  Parrett, NP  HYDROcodone-acetaminophen (HYCET) 7.5-325 mg/15 ml solution Take 15 mLs by mouth 4 (four) times daily as needed for moderate pain. 08/30/16 08/30/17  Tammy S Parrett, NP  omeprazole (PRILOSEC) 40 MG capsule Take 1 capsule (40 mg total) by mouth 2 (two) times daily. 07/13/16   Levin Erp, PA  oxycodone (OXY-IR) 5 MG capsule Take 1 capsule (5 mg total) by mouth every 4 (four) hours as needed. 09/07/16   [name], MD  predniSONE (DELTASONE) 10 MG tablet Take 4 tablets (40 mg total) by mouth daily with breakfast. 08/30/16   [name], MD  Sennosides (EX-LAX) 15 MG TABS Take 15 mg by mouth daily as needed (For constipation.).     Historical Provider, MD  Tiotropium Bromide Monohydrate (SPIRIVA RESPIMAT) 2.5 MCG/ACT AERS Inhale 2 puffs into the lungs daily. 08/16/16   [name], MD     Allergies:    No Known Allergies   Physical Exam:   Vitals  Blood pressure 161/85, pulse 106, temperature 98 F (36.7 C), temperature source Oral, resp. rate 18, height '5\' 3"'$  (1.6 m), weight 50.3 kg (111 lb), SpO2 92 %.  1.  General: Elderly female in moderate respiratory distress  2. Psychiatric:  Intact judgement and  insight, awake alert, oriented x 3.  3. Neurologic: No focal neurological deficits, all cranial nerves intact.Strength 5/5 all 4 extremities, sensation intact all 4 extremities, plantars down going.  4. Eyes :  anicteric sclerae, moist conjunctivae with no lid lag. PERRLA.  5. ENMT:  Oropharynx clear with moist mucous membranes and good dentition  6. Neck:  supple, no cervical lymphadenopathy appriciated, No thyromegaly  7. Respiratory : Moderate respiratory distress, scattered rhonchi bilaterally  8. Cardiovascular : Irregular rhythm no gallops, rubs or murmurs, no leg edema  9. Gastrointestinal:  Positive bowel sounds, abdomen soft, non-tender to palpation,no hepatosplenomegaly, no rigidity or guarding       10. Skin:  No cyanosis,  normal texture and turgor, no rash, lesions or ulcers  11.Musculoskeletal:  Good muscle tone,  joints appear normal , no effusions,  normal range of motion    Data Review:    CBC  Recent Labs Lab 09/10/16 1349  WBC 5.9  HGB 13.0  HCT 39.2  PLT 231  MCV 87.3  MCH 29.0  MCHC 33.2  RDW 18.5*   ------------------------------------------------------------------------------------------------------------------  Chemistries   Recent Labs Lab 09/10/16 1349  NA 138  K 3.6  CL 102  CO2 26  GLUCOSE 134*  BUN 19  CREATININE 0.58  CALCIUM 8.4*   ------------------------------------------------------------------------------------------------------------------  BNP (last 3 results)  Recent Labs  08/01/16 1048  PROBNP 106.0*    --------------------------------------------------------------------------------------------------------------- Urine analysis:    Component Value Date/Time   COLORURINE AMBER (A) 01/29/2016 2052   APPEARANCEUR CLOUDY (A) 01/29/2016 2052   LABSPEC 1.046 (H) 01/29/2016 2052   PHURINE 5.0 01/29/2016 2052   GLUCOSEU NEGATIVE 01/29/2016 2052   HGBUR NEGATIVE 01/29/2016 2052   BILIRUBINUR MODERATE (A) 01/29/2016 2052   KETONESUR 15 (A) 01/29/2016 2052   PROTEINUR 30 (A) 01/29/2016 2052   NITRITE NEGATIVE 01/29/2016 2052   LEUKOCYTESUR TRACE (A) 01/29/2016 2052      Imaging Results:    Dg Chest 2 View  Result Date: 09/10/2016 CLINICAL DATA:  History of lung carcinoma. Chest pain, cough and congestion for 4 weeks. EXAM: CHEST  2 VIEW COMPARISON:  09/07/2016 FINDINGS: Left suprahilar opacity, with linear opacity extending to the lateral pleural margin, is stable consistent with lung cancer treatment related scarring. Lungs are hyperexpanded but otherwise clear. No pleural effusion or pneumothorax. Cardiac silhouette is top-normal in size. No mediastinal or right hilar masses. No convincing adenopathy. Skeletal structures are demineralized. There is  mild depression of the upper endplate of an upper thoracic vertebra, stable. IMPRESSION: 1. No acute cardiopulmonary disease. No change from the prior study. Electronically Signed   By: Lajean Manes M.D.   On: 09/10/2016 14:55   Ct Angio Chest Pe W And/or Wo Contrast  Result Date: 09/10/2016 CLINICAL DATA:  patient has had increased shortness of breath/chest pain/weakness over the last 3 weeks. 84% oxygen on room air. Hx of lung cancer. Last chemo/radiation was 6-7 weeks ago. EXAM: CT ANGIOGRAPHY CHEST WITH CONTRAST TECHNIQUE: Multidetector CT imaging of the chest was performed using the standard protocol during bolus administration of intravenous contrast. Multiplanar CT image reconstructions and MIPs were obtained to evaluate the vascular anatomy. CONTRAST:  100 mL of Isovue 370 intravenous contrast COMPARISON:  Current chest radiograph.  Chest CTA, 08/03/2016. FINDINGS: Cardiovascular: Study is suboptimal for evaluation of pulmonary embolus. There is less enhancement of the pulmonary arteries in of the aorta. Allowing for the above limitation, there is no convincing pulmonary embolus. The heart is normal in size and configuration. There is a stable intra atrial lipoma. There are dense coronary artery calcifications. Dense atherosclerotic calcifications are noted along the aortic arch and the arch branch vessels. There is a moderate to severe stenosis of the left common carotid artery at its origin. There is a severe stenosis versus occlusion of the left subclavian artery. The aorta is ectatic. No aneurysm. No dissection. Mediastinum/Nodes: No mediastinal or hilar masses or adenopathy. Trachea and esophagus are unremarkable. Lungs/Pleura: Focal opacity in the superior segment left lower lobe is without significant change from the prior CT. There is a band of opacity at the level of the superior left hilum extends across the left upper lobe to the left lower lobe superior segment. This is consistent with  radiation treatment related scarring. There is also similar to the prior CT. There is additional ground-glass and coarse reticular type opacity in the left lower lobe, below the superior segment. This is new. It may reflect new scarring or pneumonia/ pneumonitis. Elsewhere, the lungs show stable areas of peripheral interstitial thickening. Changes of emphysema are stable. No pulmonary edema. No pleural effusion or pneumothorax.  Upper Abdomen: No acute abnormality. Musculoskeletal: No osteoblastic or osteolytic lesions. Review of the MIP images confirms the above findings. IMPRESSION: 1. Study is suboptimal for the evaluation of pulmonary emboli with lower enhancement in the pulmonary arteries than in the aorta. Allowing for this, there is no evidence of a pulmonary embolus. 2. New area of ground-glass and reticular type opacity in the mid aspect of the left lower lobe. This may reflect infection or post radiation induced inflammation. No other evidence of an acute abnormality within the chest. 3. Focal opacity in the superior segment left lower lobe with a bandlike area of opacity across the left upper lobe and superior segment left lower lobe is stable reflecting treatment related scarring. 4. Significant atherosclerotic disease of the aorta and branch vessels and significant coronary artery calcifications. Electronically Signed   By: Lajean Manes M.D.   On: 09/10/2016 16:04    My personal review of EKG: Irregular rhythm, multifocal atrial tachycardia versus A. fib   Assessment & Plan:    Active Problems:   HTN (hypertension)   Hypoxia   Dysphagia   History of esophageal stricture   Mass of lower lobe of left lung   Non-small cell cancer of left lung (HCC)   Radiation pneumonitis (HCC)   COPD exacerbation ([employer])   1. Acute hypoxic respiratory failure- secondary to COPD exacerbation and? Pneumonia, will start Solu-Medrol 60 g IV every 6 hours, Xopenex neb Moses every 6 hours, ipratropium nebs every  6 hours, Mucinex 1 tablet by mouth twice a day. Patient is a DO NOT RESUSCITATE, but agreeable for BiPAP in case needed. 2. ? Healthcare associated pneumonia versus radiation pneumonitis- CT chest showed groundglass opacity near the left lower lobe, radiation pneumonitis versus infectious etiology. Patient has been empirically started on vancomycin and Zosyn, follow blood cultures. If suspicion remain low for pneumonia consider changing to by mouth antibiotics in next 24 hours 3. Atrial fibrillation with RVR- we'll start patient on Cardizem infusion, once patient off Cardizem consider starting calcium channel blocker or beta blocker. Patient is on aspirin, and no anticoagulation. 4. Hypertension- currently on Cardizem, will start hydralazine 25 mg every 6 hours when necessary for BP greater than 160/100. 5. Non-small cell lung cancer of left lung-patient is status post chemotherapy, radiation treatment. Followed by oncology as outpatient 6. History of esophageal stricture- status post dilation, no odynophagia at this time.   DVT Prophylaxis-   Lovenox  AM Labs Ordered, also please review Full Orders  Family Communication: Admission, patients condition and plan of care including tests being ordered have been discussed with the patient and her son-in-law at bedside* who indicate understanding and agree with the plan and Code Status.  Code Status:  DO NOT RESUSCITATE  Admission status: Inpatient    Time spent in minutes : 60 minutes   Parker Sawatzky S M.D on 09/10/2016 at 5:03 PM  Between 7am to 7pm - Pager - [phone]. After 7pm go to [email or web address] - password [facility]  [facility] - Office  [phone]

## 2016-09-10 NOTE — ED Notes (Addendum)
Patient's oxygen saturation on room air dropped to 85%. Patient placed back on 2 L [place]. Patient's oxygen 93%

## 2016-09-10 NOTE — ED Notes (Signed)
Respiratory notified of nebulization.

## 2016-09-10 NOTE — Progress Notes (Signed)
Pharmacy Antibiotic Note  Carol [name] is a 82 y.o. female admitted on 09/10/2016 with pneumonia.  Pt with history of non-small cell lung cancer, esophageal stricture status post dilation, COPD, diastolic heart failure, atrial fibrillation, hypertension who came to the hospital for worsening shortness of breath and chest pain ongoing for past 1 week.  Pharmacy has been consulted for vancomycin and zosyn dosing.  Plan: Vancomycin 1gm IV x 1 then continue 1gm IV q24h Zosyn 3.375g IV Q8H infused over 4hrs. Follow renal function, cultures, clinical course  Height: '5\' 3"'$  (160 cm) Weight: 111 lb (50.3 kg) IBW/kg (Calculated) : 52.4  Temp (24hrs), Avg:98.3 F (36.8 C), Min:98 F (36.7 C), Max:98.6 F (37 C)   Recent Labs Lab 09/10/16 1349 09/10/16 1410 09/10/16 1728  WBC 5.9  --   --   CREATININE 0.58  --   --   LATICACIDVEN  --  1.47 4.27*    Estimated Creatinine Clearance: 43.8 mL/min (by C-G formula based on SCr of 0.58 mg/dL).    No Known Allergies  Antimicrobials this admission: 10/7 >> vancomycin 10/7 >> zosyn   Microbiology results: 10/7 BCx: sent   Thank you for allowing pharmacy to be a part of this patient's care.  Dolly Rias RPh 09/10/2016, 6:44 PM Pager [phone]

## 2016-09-10 NOTE — ED Notes (Addendum)
Hospitalist Darrick Meigs, MD paged patient's lactic level.

## 2016-09-10 NOTE — ED Notes (Signed)
Hospitalist at bedside 

## 2016-09-10 NOTE — Progress Notes (Signed)
Pt arrived in room 1228 at 1830.  Pt was tachypnic and tachycardiac with afib rvr.  Increased cardizem to 10 and put her on 6L [place].  [name], RN

## 2016-09-10 NOTE — ED Notes (Signed)
Patient transported to radiology

## 2016-09-10 NOTE — ED Provider Notes (Signed)
[place] DEPT Provider Note   CSN: [id number] Arrival date & time: 09/10/16  1323     History   Chief Complaint Chief Complaint  Patient presents with  . Shortness of Breath  . Chest Pain    HPI Carol [name] is a 82 y.o. female.   Shortness of Breath  This is a new problem. The average episode lasts 3 days. The problem occurs continuously.The current episode started more than 2 days ago. The problem has been gradually worsening. Associated symptoms include coryza, rhinorrhea, cough, sputum production and wheezing. Risk factors include recent prolonged sitting.    Past Medical History:  Diagnosis Date  . Abdominal aortic aneurysm (AAA) ([place])   . Cataract   . COPD (chronic obstructive pulmonary disease) ([place])   . Coronary artery disease   . Encounter for antineoplastic chemotherapy 05/16/2016  . Fall   . GERD (gastroesophageal reflux disease)   . Hiatal hernia   . History of esophageal stricture   . Hypertension   . Lung cancer ([place])    squamous cell carcinoma LLL  . Osteoporosis   . Peripheral arterial disease [facility])     Patient Active Problem List   Diagnosis Date Noted  . COPD exacerbation ([place]) 09/10/2016  . Chest pain 09/07/2016  . Radiation pneumonitis ([place]) 08/16/2016  . Encounter for antineoplastic chemotherapy 05/16/2016  . Non-small cell cancer of left lung ([place]) 04/25/2016  . Mass of lower lobe of left lung   . PAD (peripheral artery disease) ([place]) 03/14/2016  . Dysphagia 02/22/2016  . History of esophageal stricture 02/22/2016  . H/O hiatal hernia 02/22/2016  . Coronary atherosclerosis of native coronary artery 02/19/2016  . Atherosclerosis of aorta ([place]) 02/19/2016  . Former smoker 02/10/2016  . Hypoxia 02/10/2016  . Aortic stenosis 02/10/2016  . Hernia, hiatal 02/10/2016  . Acute renal failure (ARF) ([place]) 01/29/2016  . COPD (chronic obstructive pulmonary disease) ([place]) 01/29/2016  . HTN (hypertension) 01/29/2016  . Hyperlipidemia  01/29/2016  . Abnormal EKG 01/29/2016  . Lung mass 01/29/2016  . AAA (abdominal aortic aneurysm) ([place]) 01/29/2016    Past Surgical History:  Procedure Laterality Date  . CATARACT EXTRACTION    . COLONOSCOPY    . ENDOBRONCHIAL ULTRASOUND Bilateral 03/28/2016   Procedure: ENDOBRONCHIAL ULTRASOUND;  Surgeon: [name], MD;  Location: [facility] ENDOSCOPY;  Service: Cardiopulmonary;  Laterality: Bilateral;  dr. Ashok Cordia insists on mac not general anesthesia  . ESOPHAGOGASTRODUODENOSCOPY (EGD) WITH ESOPHAGEAL DILATION    . esopheal dilation  02-26-16  . EYE SURGERY      OB History    No data available       Home Medications    Prior to Admission medications   Medication Sig Start Date End Date Taking? Authorizing Provider  albuterol (PROVENTIL HFA) 108 (90 Base) MCG/ACT inhaler Inhale 2 puffs into the lungs 2 (two) times daily as needed for shortness of breath. 02/10/16 02/09/17  Renee A Kuneff, DO  amLODipine (NORVASC) 5 MG tablet Take 1 tablet (5 mg total) by mouth daily. 07/05/16 07/05/17  Renee A Kuneff, DO  aspirin EC 81 MG tablet Take 81 mg by mouth daily.    Historical Provider, MD  atorvastatin (LIPITOR) 10 MG tablet Take 1 tablet (10 mg total) by mouth daily at 6 PM. Reported on 01/29/2016 02/19/16   Minus Breeding, MD  budesonide-formoterol [facility]) 160-4.5 MCG/ACT inhaler Inhale 2 puffs into the lungs 2 (two) times daily. 08/01/16   Tammy S Parrett, NP  HYDROcodone-acetaminophen (HYCET) 7.5-325 mg/15 ml solution Take  15 mLs by mouth 4 (four) times daily as needed for moderate pain. 08/30/16 08/30/17  Tammy S Parrett, NP  omeprazole (PRILOSEC) 40 MG capsule Take 1 capsule (40 mg total) by mouth 2 (two) times daily. 07/13/16   Levin Erp, PA  oxycodone (OXY-IR) 5 MG capsule Take 1 capsule (5 mg total) by mouth every 4 (four) hours as needed. 09/07/16   [name], MD  predniSONE (DELTASONE) 10 MG tablet Take 4 tablets (40 mg total) by mouth daily with breakfast. 08/30/16    [name], MD  Sennosides (EX-LAX) 15 MG TABS Take 15 mg by mouth daily as needed (For constipation.).     Historical Provider, MD  Tiotropium Bromide Monohydrate (SPIRIVA RESPIMAT) 2.5 MCG/ACT AERS Inhale 2 puffs into the lungs daily. 08/16/16   [name], MD    Family History Family History  Problem Relation Age of Onset  . Breast cancer Mother 57  . Alcohol abuse Father   . Emphysema Father   . Hypertension Paternal Grandmother   . Early death Brother     MVA  . Rheumatologic disease Neg Hx     Social History Social History  Substance Use Topics  . Smoking status: Former Smoker    Packs/day: 1.50    Years: 60.00    Types: Cigarettes    Quit date: 02/01/2016  . Smokeless tobacco: Never Used  . Alcohol use 0.0 oz/week     Comment: 1-2 glasses of wine daily     Allergies   Review of patient's allergies indicates no known allergies.   Review of Systems Review of Systems  Constitutional: Positive for appetite change and fatigue.  HENT: Positive for rhinorrhea.   Respiratory: Positive for cough, sputum production, shortness of breath and wheezing.   All other systems reviewed and are negative.    Physical Exam Updated Vital Signs BP 161/85 (BP Location: Right Arm)   Pulse 106   Temp 98 F (36.7 C) (Oral)   Resp 18   Ht '5\' 3"'$  (1.6 m)   Wt 111 lb (50.3 kg)   SpO2 92%   BMI 19.66 kg/m   Physical Exam  Constitutional: She is oriented to person, place, and time. She appears well-developed and well-nourished.  HENT:  Head: Normocephalic and atraumatic.  Eyes: Right eye exhibits no discharge.  Neck: Normal range of motion.  Cardiovascular: An irregular rhythm present. Tachycardia present.   No murmur heard. Pulmonary/Chest: Effort normal. No stridor. Tachypnea noted. No respiratory distress. She has wheezes. She has rales.  Abdominal: She exhibits no distension.  Musculoskeletal: She exhibits no edema or deformity.  Neurological: She is alert  and oriented to person, place, and time.  Skin: No erythema. No pallor.  Nursing note and vitals reviewed.    ED Treatments / Results  Labs (all labs ordered are listed, but only abnormal results are displayed) Labs Reviewed  BASIC METABOLIC PANEL - Abnormal; Notable for the following:       Result Value   Glucose, Bld 134 (*)    Calcium 8.4 (*)    All other components within normal limits  CBC - Abnormal; Notable for the following:    RDW 18.5 (*)    All other components within normal limits  CULTURE, BLOOD (ROUTINE X 2)  CULTURE, BLOOD (ROUTINE X 2)  I-STAT TROPOININ, ED  I-STAT CG4 LACTIC ACID, ED  I-STAT CG4 LACTIC ACID, ED    EKG  EKG Interpretation  Date/Time:  Saturday September 10 2016  13:36:27 EDT Ventricular Rate:  113 PR Interval:    QRS Duration: 99 QT Interval:  327 QTC Calculation: 411 R Axis:   61 Text Interpretation:  Sinus tachycardia Multiform ventricular premature complexes Probable left atrial enlargement Low voltage, extremity leads Abnormal T, consider ischemia, diffuse leads Baseline wander in lead(s) V6 Confirmed by [facility] MD, Corene Cornea [phone]) on 09/10/2016 2:29:38 PM       Radiology Dg Chest 2 View  Result Date: 09/10/2016 CLINICAL DATA:  History of lung carcinoma. Chest pain, cough and congestion for 4 weeks. EXAM: CHEST  2 VIEW COMPARISON:  09/07/2016 FINDINGS: Left suprahilar opacity, with linear opacity extending to the lateral pleural margin, is stable consistent with lung cancer treatment related scarring. Lungs are hyperexpanded but otherwise clear. No pleural effusion or pneumothorax. Cardiac silhouette is top-normal in size. No mediastinal or right hilar masses. No convincing adenopathy. Skeletal structures are demineralized. There is mild depression of the upper endplate of an upper thoracic vertebra, stable. IMPRESSION: 1. No acute cardiopulmonary disease. No change from the prior study. Electronically Signed   By: Lajean Manes M.D.   On:  09/10/2016 14:55   Ct Angio Chest Pe W And/or Wo Contrast  Result Date: 09/10/2016 CLINICAL DATA:  patient has had increased shortness of breath/chest pain/weakness over the last 3 weeks. 84% oxygen on room air. Hx of lung cancer. Last chemo/radiation was 6-7 weeks ago. EXAM: CT ANGIOGRAPHY CHEST WITH CONTRAST TECHNIQUE: Multidetector CT imaging of the chest was performed using the standard protocol during bolus administration of intravenous contrast. Multiplanar CT image reconstructions and MIPs were obtained to evaluate the vascular anatomy. CONTRAST:  100 mL of Isovue 370 intravenous contrast COMPARISON:  Current chest radiograph.  Chest CTA, 08/03/2016. FINDINGS: Cardiovascular: Study is suboptimal for evaluation of pulmonary embolus. There is less enhancement of the pulmonary arteries in of the aorta. Allowing for the above limitation, there is no convincing pulmonary embolus. The heart is normal in size and configuration. There is a stable intra atrial lipoma. There are dense coronary artery calcifications. Dense atherosclerotic calcifications are noted along the aortic arch and the arch branch vessels. There is a moderate to severe stenosis of the left common carotid artery at its origin. There is a severe stenosis versus occlusion of the left subclavian artery. The aorta is ectatic. No aneurysm. No dissection. Mediastinum/Nodes: No mediastinal or hilar masses or adenopathy. Trachea and esophagus are unremarkable. Lungs/Pleura: Focal opacity in the superior segment left lower lobe is without significant change from the prior CT. There is a band of opacity at the level of the superior left hilum extends across the left upper lobe to the left lower lobe superior segment. This is consistent with radiation treatment related scarring. There is also similar to the prior CT. There is additional ground-glass and coarse reticular type opacity in the left lower lobe, below the superior segment. This is new. It may  reflect new scarring or pneumonia/ pneumonitis. Elsewhere, the lungs show stable areas of peripheral interstitial thickening. Changes of emphysema are stable. No pulmonary edema. No pleural effusion or pneumothorax. Upper Abdomen: No acute abnormality. Musculoskeletal: No osteoblastic or osteolytic lesions. Review of the MIP images confirms the above findings. IMPRESSION: 1. Study is suboptimal for the evaluation of pulmonary emboli with lower enhancement in the pulmonary arteries than in the aorta. Allowing for this, there is no evidence of a pulmonary embolus. 2. New area of ground-glass and reticular type opacity in the mid aspect of the left lower lobe. This may  reflect infection or post radiation induced inflammation. No other evidence of an acute abnormality within the chest. 3. Focal opacity in the superior segment left lower lobe with a bandlike area of opacity across the left upper lobe and superior segment left lower lobe is stable reflecting treatment related scarring. 4. Significant atherosclerotic disease of the aorta and branch vessels and significant coronary artery calcifications. Electronically Signed   By: Lajean Manes M.D.   On: 09/10/2016 16:04    Procedures Procedures (including critical care time)  CRITICAL CARE Performed by: Merrily Pew Total critical care time: 35 minutes Critical care time was exclusive of separately billable procedures and treating other patients. Critical care was necessary to treat or prevent imminent or life-threatening deterioration. Critical care was time spent personally by me on the following activities: development of treatment plan with patient and/or surrogate as well as nursing, discussions with consultants, evaluation of patient's response to treatment, examination of patient, obtaining history from patient or surrogate, ordering and performing treatments and interventions, ordering and review of laboratory studies, ordering and review of  radiographic studies, pulse oximetry and re-evaluation of patient's condition.   Medications Ordered in ED Medications  vancomycin (VANCOCIN) IVPB 1000 mg/200 mL premix (1,000 mg Intravenous New Bag/Given 09/10/16 1642)  levalbuterol (XOPENEX) nebulizer solution 0.63 mg (0.63 mg Nebulization Not Given 09/10/16 1701)  diltiazem (CARDIZEM) 100 mg in dextrose 5% 129m (1 mg/mL) infusion (not administered)  albuterol (PROVENTIL) (2.5 MG/3ML) 0.083% nebulizer solution 5 mg (5 mg Nebulization Given 09/10/16 1403)  fentaNYL (SUBLIMAZE) injection 100 mcg (100 mcg Intravenous Given 09/10/16 1543)  iopamidol (ISOVUE-370) 76 % injection 100 mL (100 mLs Intravenous Contrast Given 09/10/16 1521)  ipratropium-albuterol (DUONEB) 0.5-2.5 (3) MG/3ML nebulizer solution 3 mL (3 mLs Nebulization Given 09/10/16 1625)  piperacillin-tazobactam (ZOSYN) 3.375 g in dextrose 5 % 50 mL IVPB (0 g Intravenous Stopped 09/10/16 1717)  methylPREDNISolone sodium succinate (SOLU-MEDROL) 125 mg/2 mL injection 125 mg (125 mg Intravenous Given 09/10/16 1617)  sodium chloride 0.9 % bolus 1,000 mL (1,000 mLs Intravenous New Bag/Given 09/10/16 1619)     Initial Impression / Assessment and Plan / ED Course  I have reviewed the triage vital signs and the nursing notes.  Pertinent labs & imaging results that were available during my care of the patient were reviewed by me and considered in my medical decision making (see chart for details).  Clinical Course    Concern for HCAP v PE. New hypoxia. Pleuritic pain. Will CT for PE. otherwise admit for iv antibiotics.   Ct with e/o infiltrate concerning for pneumonia given clinical situation. Covered w/ vanc/zosyn for HCAP (in setting of cancer).  D/w medicine and will admit.   Final Clinical Impressions(s) / ED Diagnoses   Final diagnoses:  Hypoxia  Chest pain, unspecified type    New Prescriptions New Prescriptions   No medications on file     JMerrily Pew MD 09/10/16 1721

## 2016-09-10 NOTE — ED Triage Notes (Signed)
Per EMS, patient has had increased shortness of breath/chest pain/weakness over the last 3 weeks. 84% oxygen on room air. Placed on 4 L [place], oxygen saturation 95% Hx of lung cancer. Last chemo/radiation was 6-7 weeks ago. Patient is from home.

## 2016-09-10 NOTE — ED Notes (Signed)
Bed: TP12 Expected date:  Expected time:  Means of arrival:  Comments: EMS - cancer pt, weakness

## 2016-09-10 NOTE — ED Notes (Signed)
First I stat Lactic was normal -  Second Lactic was Critical.  RN is going to page the hospitalist.  Dr Venora Maples also informed.

## 2016-09-11 DIAGNOSIS — J9621 Acute and chronic respiratory failure with hypoxia: Secondary | ICD-10-CM

## 2016-09-11 DIAGNOSIS — I4891 Unspecified atrial fibrillation: Secondary | ICD-10-CM

## 2016-09-11 DIAGNOSIS — R0902 Hypoxemia: Secondary | ICD-10-CM

## 2016-09-11 LAB — COMPREHENSIVE METABOLIC PANEL
ALBUMIN: 3 g/dL — AB (ref 3.5–5.0)
ALT: 37 U/L (ref 14–54)
AST: 30 U/L (ref 15–41)
Alkaline Phosphatase: 100 U/L (ref 38–126)
Anion gap: 11 (ref 5–15)
BUN: 16 mg/dL (ref 6–20)
CHLORIDE: 103 mmol/L (ref 101–111)
CO2: 25 mmol/L (ref 22–32)
CREATININE: 0.58 mg/dL (ref 0.44–1.00)
Calcium: 8.1 mg/dL — ABNORMAL LOW (ref 8.9–10.3)
GFR calc Af Amer: >60 mL/min (ref >60)
GFR calc non Af Amer: >60 mL/min (ref >60)
Glucose, Bld: 172 mg/dL — ABNORMAL HIGH (ref 65–99)
POTASSIUM: 3.7 mmol/L (ref 3.5–5.1)
Sodium: 139 mmol/L (ref 135–145)
TOTAL PROTEIN: 6.8 g/dL (ref 6.5–8.1)
Total Bilirubin: 0.6 mg/dL (ref 0.3–1.2)

## 2016-09-11 LAB — LACTIC ACID, PLASMA
LACTIC ACID, VENOUS: 1.3 mmol/L (ref 0.5–1.9)
LACTIC ACID, VENOUS: 2.5 mmol/L — AB (ref 0.5–1.9)

## 2016-09-11 LAB — CBC
HEMATOCRIT: 38.1 % (ref 36.0–46.0)
HEMOGLOBIN: 12.2 g/dL (ref 12.0–15.0)
MCH: 28.7 pg (ref 26.0–34.0)
MCHC: 32 g/dL (ref 30.0–36.0)
MCV: 89.6 fL (ref 78.0–100.0)
Platelets: 207 10*3/uL (ref 150–400)
RBC: 4.25 MIL/uL (ref 3.87–5.11)
RDW: 18.7 % — ABNORMAL HIGH (ref 11.5–15.5)
WBC: 5.3 10*3/uL (ref 4.0–10.5)

## 2016-09-11 MED ORDER — LEVALBUTEROL HCL 0.63 MG/3ML IN NEBU: 0.6300 mg | INHALATION_SOLUTION | RESPIRATORY_TRACT | Status: DC | PRN

## 2016-09-11 MED ORDER — BUDESONIDE 0.25 MG/2ML IN SUSP
0.2500 mg | Freq: Two times a day (BID) | RESPIRATORY_TRACT | Status: DC
  Administered 2016-09-11 – 2016-09-14 (×8): 0.25 mg via RESPIRATORY_TRACT
  Filled 2016-09-11 (×9): qty 2

## 2016-09-11 NOTE — Progress Notes (Signed)
[facility] PROGRESS NOTE  Carol [name] MBW:[id number] DOB: 19-Mar-1932 DOA: 09/10/2016 PCP: [name], DO  Interim summary and HPI 82 y.o. female, With history of non-small cell lung cancer, esophageal stricture status post dilation, COPD, diastolic heart failure, atrial fibrillation, hypertension who came to the hospital for worsening shortness of breath and chest pain ongoing for past 1 week. Patient underwent chemotherapy and radiation for left lung mass, hard last radiation treatment was in July. Patient was diagnosed with radiation pneumonitis and was prescribed steroids, and the dose of steroid was tapered to 20 mg daily she started having symptoms of pain and shortness of breath. She denies fever, has been coughing, but unable to cough up phlegm.  In the ED CT chest shows ground glass opacity near the left lower lobe mass, inflammatory versus infectious. Patient empirically started on vancomycin and Zosyn for possible healthcare associated pneumonia. Also found to be in A. fib with RVR, uncontrolled hypertension.  Assessment/Plan: 1. Acute hypoxic respiratory failure- secondary to COPD exacerbation and Pneumonia. Will continue Solu-Medrol 60 g IV every 6 hours, Xopenex neb q6 hours, along with ipratropium nebs every 6 hours. Will also continue mucinex 1 tablet by mouth twice a day. Will start pulmicort and flutter valve. Continue antibiotics as described below. 2. Healthcare associated pneumonia versus radiation pneumonitis- CT chest showed groundglass opacity near the left lower lobe, radiation pneumonitis versus infectious etiology considered. Will continue empirically vancomycin and Zosyn, follow blood cultures and sputum cx's.will transition to oral regimen in 24-48 hours, base on clinical response. Continue flutte rvalve and start pulmicort. 3. Atrial fibrillation with RVR- will continue cardizem infusion, once patient HR stable; will transition to oral regimen. Continue ASA, no  anticoagulation candidate given age and fall risk. CHADsVASC score 3  4. Hypertension- stable now. Continue on Cardizem, will monitor and adjust as needed. 5. Non-small cell lung cancer of left lung-patient is status post chemotherapy, radiation treatment. will continue follow up by oncology as outpatient 6. History of esophageal stricture- status post dilation, no odynophagia at this time. Continue PPI.   Code Status: DNR Family Communication: no family at bedside  Disposition Plan: remains in stepdown unit; continue ID diltiazem, steroids, nebulizer treatment and current antibiotics. Will follow clinical response.   Consultants:  None   Procedures:  See below for x-ray reports   Antibiotics:  Vancomycin and zosyn: 09/10/16  HPI/Subjective: Still SOB (even improved), unable to speak in full sentences and mils intercostal muscles use. Patient is on A. Fib and HR in low to mid 100's.  Objective: Vitals:   09/11/16 1200 09/11/16 1250  BP:  109/73  Pulse:  (!) 58  Resp:  (!) 25  Temp: 97.8 F (36.6 C)     Intake/Output Summary (Last 24 hours) at 09/11/16 1537 Last data filed at 09/11/16 1132  Gross per 24 hour  Intake          1455.12 ml  Output              350 ml  Net          1105.12 ml   Filed Weights   09/10/16 1332  Weight: 50.3 kg (111 lb)    Exam:   General:  Afebrile, no CP; reports palpitations; even improved breathing overall, still unable to speak in full sentences and requiring nasal canal oxygen supplementation 4L.  Cardiovascular: irregular, no rubs, no gallops, no JVD  Respiratory: diffuse wheezing, positive rhonchi, fair air movement, no crackles. Mild use of intercostal muscles.  Abdomen: soft, NT, ND, positive BS  Musculoskeletal: no cyanosis, no clubbing   Data Reviewed: Basic Metabolic Panel:  Recent Labs Lab 09/10/16 1349 09/11/16 0330  NA 138 139  K 3.6 3.7  CL 102 103  CO2 26 25  GLUCOSE 134* 172*  BUN 19 16  CREATININE  0.58 0.58  CALCIUM 8.4* 8.1*   Liver Function Tests:  Recent Labs Lab 09/11/16 0330  AST 30  ALT 37  ALKPHOS 100  BILITOT 0.6  PROT 6.8  ALBUMIN 3.0*   CBC:  Recent Labs Lab 09/10/16 1349 09/11/16 0330  WBC 5.9 5.3  HGB 13.0 12.2  HCT 39.2 38.1  MCV 87.3 89.6  PLT 231 207   ProBNP (last 3 results)  Recent Labs  08/01/16 1048  PROBNP 106.0*    CBG: No results for input(s): GLUCAP in the last 168 hours.  Recent Results (from the past 240 hour(s))  Blood culture (routine x 2)     Status: None (Preliminary result)   Collection Time: 09/10/16  1:48 PM  Result Value Ref Range Status   Specimen Description BLOOD RIGHT ANTECUBITAL  Final   Special Requests BOTTLES DRAWN AEROBIC AND ANAEROBIC 5CC  Final   Culture   Final    NO GROWTH < 24 HOURS Performed at [facility]    Report Status PENDING  Incomplete  Blood culture (routine x 2)     Status: None (Preliminary result)   Collection Time: 09/10/16  4:13 PM  Result Value Ref Range Status   Specimen Description BLOOD RIGHT ANTECUBITAL  Final   Special Requests BOTTLES DRAWN AEROBIC AND ANAEROBIC 5CC  Final   Culture   Final    NO GROWTH < 24 HOURS Performed at [facility]    Report Status PENDING  Incomplete  MRSA PCR Screening     Status: None   Collection Time: 09/10/16  6:45 PM  Result Value Ref Range Status   MRSA by PCR NEGATIVE NEGATIVE Final    Comment:        The GeneXpert MRSA Assay (FDA approved for NASAL specimens only), is one component of a comprehensive MRSA colonization surveillance program. It is not intended to diagnose MRSA infection nor to guide or monitor treatment for MRSA infections.      Studies: Dg Chest 2 View  Result Date: 09/10/2016 CLINICAL DATA:  History of lung carcinoma. Chest pain, cough and congestion for 4 weeks. EXAM: CHEST  2 VIEW COMPARISON:  09/07/2016 FINDINGS: Left suprahilar opacity, with linear opacity extending to the lateral pleural  margin, is stable consistent with lung cancer treatment related scarring. Lungs are hyperexpanded but otherwise clear. No pleural effusion or pneumothorax. Cardiac silhouette is top-normal in size. No mediastinal or right hilar masses. No convincing adenopathy. Skeletal structures are demineralized. There is mild depression of the upper endplate of an upper thoracic vertebra, stable. IMPRESSION: 1. No acute cardiopulmonary disease. No change from the prior study. Electronically Signed   By: Lajean Manes M.D.   On: 09/10/2016 14:55   Ct Angio Chest Pe W And/or Wo Contrast  Result Date: 09/10/2016 CLINICAL DATA:  patient has had increased shortness of breath/chest pain/weakness over the last 3 weeks. 84% oxygen on room air. Hx of lung cancer. Last chemo/radiation was 6-7 weeks ago. EXAM: CT ANGIOGRAPHY CHEST WITH CONTRAST TECHNIQUE: Multidetector CT imaging of the chest was performed using the standard protocol during bolus administration of intravenous contrast. Multiplanar CT image reconstructions and MIPs were obtained to evaluate the  vascular anatomy. CONTRAST:  100 mL of Isovue 370 intravenous contrast COMPARISON:  Current chest radiograph.  Chest CTA, 08/03/2016. FINDINGS: Cardiovascular: Study is suboptimal for evaluation of pulmonary embolus. There is less enhancement of the pulmonary arteries in of the aorta. Allowing for the above limitation, there is no convincing pulmonary embolus. The heart is normal in size and configuration. There is a stable intra atrial lipoma. There are dense coronary artery calcifications. Dense atherosclerotic calcifications are noted along the aortic arch and the arch branch vessels. There is a moderate to severe stenosis of the left common carotid artery at its origin. There is a severe stenosis versus occlusion of the left subclavian artery. The aorta is ectatic. No aneurysm. No dissection. Mediastinum/Nodes: No mediastinal or hilar masses or adenopathy. Trachea and  esophagus are unremarkable. Lungs/Pleura: Focal opacity in the superior segment left lower lobe is without significant change from the prior CT. There is a band of opacity at the level of the superior left hilum extends across the left upper lobe to the left lower lobe superior segment. This is consistent with radiation treatment related scarring. There is also similar to the prior CT. There is additional ground-glass and coarse reticular type opacity in the left lower lobe, below the superior segment. This is new. It may reflect new scarring or pneumonia/ pneumonitis. Elsewhere, the lungs show stable areas of peripheral interstitial thickening. Changes of emphysema are stable. No pulmonary edema. No pleural effusion or pneumothorax. Upper Abdomen: No acute abnormality. Musculoskeletal: No osteoblastic or osteolytic lesions. Review of the MIP images confirms the above findings. IMPRESSION: 1. Study is suboptimal for the evaluation of pulmonary emboli with lower enhancement in the pulmonary arteries than in the aorta. Allowing for this, there is no evidence of a pulmonary embolus. 2. New area of ground-glass and reticular type opacity in the mid aspect of the left lower lobe. This may reflect infection or post radiation induced inflammation. No other evidence of an acute abnormality within the chest. 3. Focal opacity in the superior segment left lower lobe with a bandlike area of opacity across the left upper lobe and superior segment left lower lobe is stable reflecting treatment related scarring. 4. Significant atherosclerotic disease of the aorta and branch vessels and significant coronary artery calcifications. Electronically Signed   By: Lajean Manes M.D.   On: 09/10/2016 16:04    Scheduled Meds: . aspirin EC  81 mg Oral Daily  . atorvastatin  10 mg Oral q1800  . budesonide (PULMICORT) nebulizer solution  0.25 mg Nebulization BID  . enoxaparin (LOVENOX) injection  40 mg Subcutaneous Q24H  . feeding  supplement (ENSURE ENLIVE)  237 mL Oral BID BM  . furosemide  20 mg Oral Daily  . guaiFENesin  600 mg Oral BID  . Influenza vac split quadrivalent PF  0.5 mL Intramuscular Tomorrow-1000  . ipratropium  0.5 mg Nebulization Q6H  . levalbuterol  0.63 mg Nebulization Q6H  . mouth rinse  15 mL Mouth Rinse BID  . methylPREDNISolone (SOLU-MEDROL) injection  60 mg Intravenous Q6H  . pantoprazole  40 mg Oral Daily  . piperacillin-tazobactam (ZOSYN)  IV  3.375 g Intravenous Q8H  . sodium chloride flush  3 mL Intravenous Q12H  . vancomycin  1,000 mg Intravenous Q24H   Continuous Infusions: . diltiazem (CARDIZEM) infusion 10 mg/hr (09/11/16 1132)    Active Problems:   HTN (hypertension)   Hypoxia   Dysphagia   History of esophageal stricture   Mass of lower lobe of left  lung   Non-small cell cancer of left lung (HCC)   Radiation pneumonitis ([place])   COPD exacerbation ([place])    Time spent: 35 minutes (> 50% of the time dedicated to face to evaluation, medical records reviewed, decision making and coordinating/explaining plan of care)    Barton Dubois  [facility] Pager [phone]. If 7PM-7AM, please contact night-coverage at [email or web address], password [facility] 09/11/2016, 3:37 PM  LOS: 1 day

## 2016-09-12 ENCOUNTER — Telehealth: Payer: Self-pay | Admitting: Pulmonary Disease

## 2016-09-12 LAB — BASIC METABOLIC PANEL
ANION GAP: 10 (ref 5–15)
BUN: 19 mg/dL (ref 6–20)
CHLORIDE: 101 mmol/L (ref 101–111)
CO2: 27 mmol/L (ref 22–32)
CREATININE: 0.66 mg/dL (ref 0.44–1.00)
Calcium: 8.3 mg/dL — ABNORMAL LOW (ref 8.9–10.3)
GFR calc non Af Amer: >60 mL/min (ref >60)
Glucose, Bld: 171 mg/dL — ABNORMAL HIGH (ref 65–99)
POTASSIUM: 3.2 mmol/L — AB (ref 3.5–5.1)
SODIUM: 138 mmol/L (ref 135–145)

## 2016-09-12 MED ORDER — ADULT MULTIVITAMIN W/MINERALS CH
1.0000 | ORAL_TABLET | Freq: Every day | ORAL | Status: DC
  Administered 2016-09-13 – 2016-09-15 (×3): 1 via ORAL
  Filled 2016-09-12 (×3): qty 1

## 2016-09-12 MED ORDER — DILTIAZEM HCL 30 MG PO TABS
30.0000 mg | ORAL_TABLET | Freq: Three times a day (TID) | ORAL | Status: DC
  Administered 2016-09-12 – 2016-09-13 (×4): 30 mg via ORAL
  Filled 2016-09-12 (×4): qty 1

## 2016-09-12 MED ORDER — IPRATROPIUM BROMIDE 0.02 % IN SOLN
0.5000 mg | Freq: Three times a day (TID) | RESPIRATORY_TRACT | Status: DC
  Administered 2016-09-12 – 2016-09-15 (×9): 0.5 mg via RESPIRATORY_TRACT
  Filled 2016-09-12 (×10): qty 2.5

## 2016-09-12 MED ORDER — VANCOMYCIN HCL 500 MG IV SOLR
500.0000 mg | Freq: Two times a day (BID) | INTRAVENOUS | Status: DC
  Administered 2016-09-12 – 2016-09-13 (×2): 500 mg via INTRAVENOUS
  Filled 2016-09-12 (×2): qty 500

## 2016-09-12 MED ORDER — POTASSIUM CHLORIDE CRYS ER 20 MEQ PO TBCR
40.0000 meq | EXTENDED_RELEASE_TABLET | ORAL | Status: AC
Start: 2016-09-12 — End: 2016-09-12
  Administered 2016-09-12 (×3): 40 meq via ORAL
  Filled 2016-09-12 (×3): qty 2

## 2016-09-12 MED ORDER — METHYLPREDNISOLONE SODIUM SUCC 125 MG IJ SOLR
60.0000 mg | Freq: Three times a day (TID) | INTRAMUSCULAR | Status: DC
  Administered 2016-09-12 – 2016-09-14 (×6): 60 mg via INTRAVENOUS
  Filled 2016-09-12 (×6): qty 2

## 2016-09-12 MED ORDER — DILTIAZEM HCL 30 MG PO TABS: 30.0000 mg | ORAL_TABLET | Freq: Three times a day (TID) | ORAL | Status: DC

## 2016-09-12 MED ORDER — LEVALBUTEROL HCL 0.63 MG/3ML IN NEBU
0.6300 mg | INHALATION_SOLUTION | Freq: Three times a day (TID) | RESPIRATORY_TRACT | Status: DC
  Administered 2016-09-12 – 2016-09-15 (×9): 0.63 mg via RESPIRATORY_TRACT
  Filled 2016-09-12 (×10): qty 3

## 2016-09-12 NOTE — Telephone Encounter (Signed)
I will inquire with her attending physician first then touch base with the [facility]. Thanks for letting me know.

## 2016-09-12 NOTE — Evaluation (Signed)
Physical Therapy Evaluation Patient Details Name: Carol [name] MRN: [id number] DOB: 03/08/1932 Today's Date: 09/12/2016   History of Present Illness  Pt admitted with respiratory failure 2* PNA and COPD exacerbation.  Pt with hx of CAD, COPD, AAA, and lung CA  Clinical Impression  Pt admitted as above and presenting with functional mobility limitations 2* generalized weakness, poor endurance and ambulatory balance deficits.  Pt should progress to dc home with family assist and HHPT follow up.    Follow Up Recommendations Home health PT    Equipment Recommendations  None recommended by PT    Recommendations for Other Services OT consult     Precautions / Restrictions Precautions Precautions: Fall Restrictions Weight Bearing Restrictions: No      Mobility  Bed Mobility Overal bed mobility: Needs Assistance Bed Mobility: Supine to Sit     Supine to sit: Min assist;Mod assist     General bed mobility comments: cues for log roll and assist to manage LEs over EOB and bring trunk to upright sitting  Transfers Overall transfer level: Needs assistance Equipment used: Rolling walker (2 wheeled) Transfers: Sit to/from Stand Sit to Stand: Min assist;Mod assist Stand pivot transfers: Min assist       General transfer comment: cues for transition position and use of UEs to self assist  Ambulation/Gait Ambulation/Gait assistance: Min assist;+2 safety/equipment Ambulation Distance (Feet): 55 Feet Assistive device: Rolling walker (2 wheeled) Gait Pattern/deviations: Step-through pattern;Decreased step length - right;Decreased step length - left;Shuffle;Trunk flexed Gait velocity: decr Gait velocity interpretation: Below normal speed for age/gender General Gait Details: cues for posture, pace and position from RW.  Distance ltd by fatigue and increasing SOB  Stairs            Wheelchair Mobility    Modified Rankin (Stroke Patients Only)       Balance Overall  balance assessment: Needs assistance Sitting-balance support: No upper extremity supported;Feet supported Sitting balance-Leahy Scale: Good     Standing balance support: Bilateral upper extremity supported Standing balance-Leahy Scale: Poor                               Pertinent Vitals/Pain Pain Assessment: No/denies pain    Home Living Family/patient expects to be discharged to:: Private residence Living Arrangements: Children Available Help at Discharge: Family Type of Home: House Home Access: Stairs to enter Entrance Stairs-Rails: Psychiatric nurse of Steps: 3 Home Layout: One level Home Equipment: Environmental consultant - 2 wheels;Bedside commode      Prior Function Level of Independence: Independent         Comments: Ambulates only short distances due to LE weakness/pain and fatigue.  Does not drive.  Uses power buggy in stores     Hand Dominance        Extremity/Trunk Assessment   Upper Extremity Assessment: Generalized weakness           Lower Extremity Assessment: Generalized weakness      Cervical / Trunk Assessment: Kyphotic  Communication   Communication: No difficulties  Cognition Arousal/Alertness: Awake/alert Behavior During Therapy: WFL for tasks assessed/performed Overall Cognitive Status: Within Functional Limits for tasks assessed                      General Comments      Exercises     Assessment/Plan    PT Assessment Patient needs continued PT services  PT Problem List Decreased strength;Decreased range of motion;Decreased  activity tolerance;Decreased balance;Decreased mobility;Decreased knowledge of use of DME;Pain          PT Treatment Interventions DME instruction;Gait training;Stair training;Functional mobility training;Therapeutic activities;Therapeutic exercise;Patient/family education    PT Goals (Current goals can be found in the Care Plan section)  Acute Rehab PT Goals Patient Stated Goal:  Regain level of IND needed to resume previous lifestyle PT Goal Formulation: With patient Time For Goal Achievement: 2016-09-29 Potential to Achieve Goals: Good    Frequency Min 3X/week   Barriers to discharge        Co-evaluation               End of Session Equipment Utilized During Treatment: Gait belt;Oxygen Activity Tolerance: Patient tolerated treatment well Patient left: in chair;with call bell/phone within reach Nurse Communication: Mobility status         Time: 1595-3967 PT Time Calculation (min) (ACUTE ONLY): 26 min   Charges:   PT Evaluation $PT Eval Low Complexity: 1 Procedure PT Treatments $Gait Training: 8-22 mins   PT G Codes:        Carol [name] 2016/09/17, 1:03 PM

## 2016-09-12 NOTE — Progress Notes (Signed)
[facility] PROGRESS NOTE  Carol [name] DPO:[id number] DOB: [birth year] DOA: 09/10/2016 PCP: [name], DO  Interim summary and HPI 82 y.o. female, With history of non-small cell lung cancer, esophageal stricture status post dilation, COPD, diastolic heart failure, atrial fibrillation, hypertension who came to the hospital for worsening shortness of breath and chest pain ongoing for past 1 week. Patient underwent chemotherapy and radiation for left lung mass, hard last radiation treatment was in July. Patient was diagnosed with radiation pneumonitis and was prescribed steroids, and the dose of steroid was tapered to 20 mg daily she started having symptoms of pain and shortness of breath. She denies fever, has been coughing, but unable to cough up phlegm.  In the ED CT chest shows ground glass opacity near the left lower lobe mass, inflammatory versus infectious. Patient empirically started on vancomycin and Zosyn for possible healthcare associated pneumonia. Also found to be in A. fib with RVR, uncontrolled hypertension.  Assessment/Plan: 1. Acute hypoxic respiratory failure- secondary to COPD exacerbation and Pneumonia. Will continue Solu-Medrol 60 mg IV every 8 hours now (in order to initiate tapering), Xopenex neb q6 hours, along with ipratropium nebs every 6 hours. Will also continue mucinex 1 tablet by mouth twice a day. Will continue pulmicort and flutter valve. Continue antibiotics as described below. 2. Healthcare associated pneumonia versus radiation pneumonitis- CT chest showed groundglass opacity near the left lower lobe, radiation pneumonitis versus infectious etiology considered. Will continue empirically vancomycin and Zosyn, follow blood cultures and sputum cx's. Will transition to oral regimen in 24 hour if remains stable and afebrile. Continue flutter valve and pulmicort. Will weaned O2 supplementation as toelrated 3. Atrial fibrillation with RVR- will continue cardizem; but  will transition to oral regimen. Continue ASA, no anticoagulation candidate given age and fall risk. CHADsVASC score 3  4. Hypertension- stable now. Continue on Cardizem, will monitor and adjust as needed. 5. Non-small cell lung cancer of left lung-patient is status post chemotherapy, radiation treatment. will continue follow up by oncology as outpatient 6. History of esophageal stricture- status post dilation, no odynophagia at this time. Continue PPI.   Code Status: DNR Family Communication: no family at bedside  Disposition Plan: will transfer to telemetry bed; transition to oral diltiazem; will continue steroids (starting tapering now), nebulizer treatment and current antibiotics. Will follow clinical response.   Consultants:  None   Procedures:  See below for x-ray reports   Antibiotics:  Vancomycin and zosyn: 09/10/16  HPI/Subjective: Breathing a lot easier and feeling much better. No CP, no nausea, no vomiting and afebrile. HR is now controlled.  Objective: Vitals:   09/12/16 0600 09/12/16 0700  BP: (!) 108/56 108/70  Pulse: (!) 37 72  Resp: (!) 9 (!) 8  Temp:      Intake/Output Summary (Last 24 hours) at 09/12/16 0858 Last data filed at 09/12/16 0500  Gross per 24 hour  Intake           738.25 ml  Output             1250 ml  Net          -511.75 ml   Filed Weights   09/10/16 1332  Weight: 50.3 kg (111 lb)    Exam:   General:  Afebrile, no CP; reports no palpitations; endorses breathing a lot easier and feeling comfortable. Capable to speak in almost full sentences and requiring nasal canal oxygen supplementation 3L now.  Cardiovascular: irregular, no rubs, no gallops, no JVD  Respiratory: mild  exp wheezing, positive rhonchi, improvement in air movement, no crackles. No using accessory muscles currently.  Abdomen: soft, NT, ND, positive BS  Musculoskeletal: no cyanosis, no clubbing   Data Reviewed: Basic Metabolic Panel:  Recent Labs Lab  09/10/16 1349 09/11/16 0330 09/12/16 0321  NA 138 139 138  K 3.6 3.7 3.2*  CL 102 103 101  CO2 '26 25 27  '$ GLUCOSE 134* 172* 171*  BUN '19 16 19  '$ CREATININE 0.58 0.58 0.66  CALCIUM 8.4* 8.1* 8.3*   Liver Function Tests:  Recent Labs Lab 09/11/16 0330  AST 30  ALT 37  ALKPHOS 100  BILITOT 0.6  PROT 6.8  ALBUMIN 3.0*   CBC:  Recent Labs Lab 09/10/16 1349 09/11/16 0330  WBC 5.9 5.3  HGB 13.0 12.2  HCT 39.2 38.1  MCV 87.3 89.6  PLT 231 207   ProBNP (last 3 results)  Recent Labs  08/01/16 1048  PROBNP 106.0*    CBG: No results for input(s): GLUCAP in the last 168 hours.  Recent Results (from the past 240 hour(s))  Blood culture (routine x 2)     Status: None (Preliminary result)   Collection Time: 09/10/16  1:48 PM  Result Value Ref Range Status   Specimen Description BLOOD RIGHT ANTECUBITAL  Final   Special Requests BOTTLES DRAWN AEROBIC AND ANAEROBIC 5CC  Final   Culture   Final    NO GROWTH < 24 HOURS Performed at [facility]    Report Status PENDING  Incomplete  Blood culture (routine x 2)     Status: None (Preliminary result)   Collection Time: 09/10/16  4:13 PM  Result Value Ref Range Status   Specimen Description BLOOD RIGHT ANTECUBITAL  Final   Special Requests BOTTLES DRAWN AEROBIC AND ANAEROBIC 5CC  Final   Culture   Final    NO GROWTH < 24 HOURS Performed at [facility]    Report Status PENDING  Incomplete  MRSA PCR Screening     Status: None   Collection Time: 09/10/16  6:45 PM  Result Value Ref Range Status   MRSA by PCR NEGATIVE NEGATIVE Final    Comment:        The GeneXpert MRSA Assay (FDA approved for NASAL specimens only), is one component of a comprehensive MRSA colonization surveillance program. It is not intended to diagnose MRSA infection nor to guide or monitor treatment for MRSA infections.      Studies: Dg Chest 2 View  Result Date: 09/10/2016 CLINICAL DATA:  History of lung carcinoma. Chest  pain, cough and congestion for 4 weeks. EXAM: CHEST  2 VIEW COMPARISON:  09/07/2016 FINDINGS: Left suprahilar opacity, with linear opacity extending to the lateral pleural margin, is stable consistent with lung cancer treatment related scarring. Lungs are hyperexpanded but otherwise clear. No pleural effusion or pneumothorax. Cardiac silhouette is top-normal in size. No mediastinal or right hilar masses. No convincing adenopathy. Skeletal structures are demineralized. There is mild depression of the upper endplate of an upper thoracic vertebra, stable. IMPRESSION: 1. No acute cardiopulmonary disease. No change from the prior study. Electronically Signed   By: Lajean Manes M.D.   On: 09/10/2016 14:55   Ct Angio Chest Pe W And/or Wo Contrast  Result Date: 09/10/2016 CLINICAL DATA:  patient has had increased shortness of breath/chest pain/weakness over the last 3 weeks. 84% oxygen on room air. Hx of lung cancer. Last chemo/radiation was 6-7 weeks ago. EXAM: CT ANGIOGRAPHY CHEST WITH CONTRAST TECHNIQUE: Multidetector CT imaging  of the chest was performed using the standard protocol during bolus administration of intravenous contrast. Multiplanar CT image reconstructions and MIPs were obtained to evaluate the vascular anatomy. CONTRAST:  100 mL of Isovue 370 intravenous contrast COMPARISON:  Current chest radiograph.  Chest CTA, 08/03/2016. FINDINGS: Cardiovascular: Study is suboptimal for evaluation of pulmonary embolus. There is less enhancement of the pulmonary arteries in of the aorta. Allowing for the above limitation, there is no convincing pulmonary embolus. The heart is normal in size and configuration. There is a stable intra atrial lipoma. There are dense coronary artery calcifications. Dense atherosclerotic calcifications are noted along the aortic arch and the arch branch vessels. There is a moderate to severe stenosis of the left common carotid artery at its origin. There is a severe stenosis versus  occlusion of the left subclavian artery. The aorta is ectatic. No aneurysm. No dissection. Mediastinum/Nodes: No mediastinal or hilar masses or adenopathy. Trachea and esophagus are unremarkable. Lungs/Pleura: Focal opacity in the superior segment left lower lobe is without significant change from the prior CT. There is a band of opacity at the level of the superior left hilum extends across the left upper lobe to the left lower lobe superior segment. This is consistent with radiation treatment related scarring. There is also similar to the prior CT. There is additional ground-glass and coarse reticular type opacity in the left lower lobe, below the superior segment. This is new. It may reflect new scarring or pneumonia/ pneumonitis. Elsewhere, the lungs show stable areas of peripheral interstitial thickening. Changes of emphysema are stable. No pulmonary edema. No pleural effusion or pneumothorax. Upper Abdomen: No acute abnormality. Musculoskeletal: No osteoblastic or osteolytic lesions. Review of the MIP images confirms the above findings. IMPRESSION: 1. Study is suboptimal for the evaluation of pulmonary emboli with lower enhancement in the pulmonary arteries than in the aorta. Allowing for this, there is no evidence of a pulmonary embolus. 2. New area of ground-glass and reticular type opacity in the mid aspect of the left lower lobe. This may reflect infection or post radiation induced inflammation. No other evidence of an acute abnormality within the chest. 3. Focal opacity in the superior segment left lower lobe with a bandlike area of opacity across the left upper lobe and superior segment left lower lobe is stable reflecting treatment related scarring. 4. Significant atherosclerotic disease of the aorta and branch vessels and significant coronary artery calcifications. Electronically Signed   By: Lajean Manes M.D.   On: 09/10/2016 16:04    Scheduled Meds: . aspirin EC  81 mg Oral Daily  .  atorvastatin  10 mg Oral q1800  . budesonide (PULMICORT) nebulizer solution  0.25 mg Nebulization BID  . diltiazem  30 mg Oral Q8H  . enoxaparin (LOVENOX) injection  40 mg Subcutaneous Q24H  . feeding supplement (ENSURE ENLIVE)  237 mL Oral BID BM  . furosemide  20 mg Oral Daily  . guaiFENesin  600 mg Oral BID  . Influenza vac split quadrivalent PF  0.5 mL Intramuscular Tomorrow-1000  . ipratropium  0.5 mg Nebulization TID  . levalbuterol  0.63 mg Nebulization TID  . mouth rinse  15 mL Mouth Rinse BID  . methylPREDNISolone (SOLU-MEDROL) injection  60 mg Intravenous Q8H  . pantoprazole  40 mg Oral Daily  . piperacillin-tazobactam (ZOSYN)  IV  3.375 g Intravenous Q8H  . potassium chloride  40 mEq Oral Q4H  . sodium chloride flush  3 mL Intravenous Q12H  . vancomycin  1,000 mg  Intravenous Q24H   Continuous Infusions:    Active Problems:   HTN (hypertension)   Hypoxia   Dysphagia   History of esophageal stricture   Mass of lower lobe of left lung   Non-small cell cancer of left lung (HCC)   Radiation pneumonitis (HCC)   COPD exacerbation ([place])    Time spent: 30 minutes (> 50% of the time dedicated to face to evaluation, medical records reviewed, decision making and coordinating/explaining plan of care)    Barton Dubois  [facility] Pager [phone]. If 7PM-7AM, please contact night-coverage at [email or web address], password [facility] 09/12/2016, 8:58 AM  LOS: 2 days

## 2016-09-12 NOTE — Telephone Encounter (Signed)
Spoke with pt's son-in-law, [name]. He states that pt was admitted to [facility] over the weekend. He would like someone from our office to come and see her ASAP.  [place], Thanks!

## 2016-09-12 NOTE — Progress Notes (Signed)
Initial Nutrition Assessment  DOCUMENTATION CODES:   Non-severe (moderate) malnutrition in context of acute illness/injury  INTERVENTION:  - Continue Ensure Enlive BID, each supplement provides 350 kcal and 20 grams of protein - Continue to encourage PO intakes of meals and supplements.  - Will order daily multivitamin with minerals.  - RD will continue to monitor for additional nutrition-related needs.  NUTRITION DIAGNOSIS:   Inadequate oral intake related to poor appetite as evidenced by per patient/family report.  GOAL:   Patient will meet greater than or equal to 90% of their needs  MONITOR:   PO intake, Supplement acceptance, Weight trends, Labs, I & O's  REASON FOR ASSESSMENT:   Malnutrition Screening Tool  ASSESSMENT:   82 y.o. female with history of non-small cell lung cancer, esophageal stricture status post dilation, COPD, diastolic heart failure, atrial fibrillation, hypertension who came to the hospital for worsening shortness of breath and chest pain ongoing for past 1 week. Patient underwent chemotherapy and radiation for left lung mass, hard last radiation treatment was in July. Patient was diagnosed with radiation pneumonitis and was prescribed steroids, and the dose of steroid was tapered to 20 mg daily she started having symptoms of pain and shortness of breath. She denies fever, has been coughing, but unable to cough up phlegm. In the ED CT chest shows ground glass opacity near the left lower lobe mass, inflammatory versus infectious.   Pt seen for MST. BMI indicates normal weight status. No intakes documented since admission. Pt reports eating part of a chicken salad sandwich and cup of soup for lunch. She states her appetite has slightly increased since admission. PTA, pt had a poor appetite x1-2 weeks and stated that she did not feel hungry but was forcing her self to eat. She denies difficulty chewing or swallowing. She states during radiation (which ended in  July) she had pain with swallowing and that prior to esophageal dilation she had difficulty swallowing; infrequently she does feel that it takes longer to swallow.  Pt states since undergoing treatment she has decreased taste sensation which has not worsened in the past few weeks. She states that she was encouraged by oncologist PTA to drink Ensure 2-3 times/day d/t wt loss, lack of appetite, and mainly consuming items such as soup when she would eat.   She states that prior to starting treatment for lung cancer her UBW was 125 lbs. Based on review of chart, pt has lost 10 lbs (8.3% body weight) in the past 3 months which is significant for time frame. Mild muscle and fat wasting noted to clavicle and shoulder and upper arm; lower body not assessed as pt was cold and covered in blankets. She denies any nutrition-related questions or concerns at this time.   Medications reviewed; 20 mg oral Lasix/day, MEDLINE mouth rinse BID, 60 mg IV Solu-medrol TID, PRN Zofran, 40 mg oral Protonix/day, 40 mEq oral KCl x3 doses today.  Labs reviewed; K: 3.2 mmol/L, Ca: 8.3 mmol/L.    Diet Order:  Diet Heart Room service appropriate? Yes; Fluid consistency: Thin  Skin:  Reviewed, no issues  Last BM:  10/7  Height:   Ht Readings from Last 1 Encounters:  09/10/16 '5\' 3"'$  (1.6 m)    Weight:   Wt Readings from Last 1 Encounters:  09/10/16 111 lb (50.3 kg)    Ideal Body Weight:  52.27 kg  BMI:  Body mass index is 19.66 kg/m.  Estimated Nutritional Needs:   Kcal:  1500-1710 (30-34 kcal/kg)  Protein:  60-70 grams (1.2-1.4 grams/kg)  Fluid:  >/= 1.5 L/day  EDUCATION NEEDS:   No education needs identified at this time    Jarome Matin, MS, RD, LDN Inpatient Clinical Dietitian Pager # [phone] After hours/weekend pager # [phone]

## 2016-09-12 NOTE — Progress Notes (Signed)
Pharmacy Antibiotic Note  Carol [name] is a 82 y.o. female admitted on 09/10/2016 with acute hypoxic respiratory in setting of AECOPD along with HCAP vs radiation pneumonitis. Pt with history of non-small cell lung cancer, esophageal stricture status post dilation, COPD, diastolic heart failure, atrial fibrillation, hypertension who came to the hospital for worsening shortness of breath and chest pain ongoing for one week.  Pharmacy has been consulted for vancomycin and Zosyn dosing.  Plan: Vancomycin 500 mg IV q12h Zosyn 3.375g IV Q8H infused over 4hrs. Follow renal function, cultures, clinical course Given negative MRSA PCR screen, suggest de-escalation by discontinuation of vancomycin.  However, attending MD notes planning to switch to PO regimen tomorrow if remains stable and afebrile  Height: '5\' 3"'$  (160 cm) Weight: 111 lb (50.3 kg) IBW/kg (Calculated) : 52.4  Temp (24hrs), Avg:97.8 F (36.6 C), Min:97.5 F (36.4 C), Max:98.4 F (36.9 C)   Recent Labs Lab 09/10/16 1349 09/10/16 1410 09/10/16 1728 09/11/16 0020 09/11/16 0330 09/11/16 0811 09/12/16 0321  WBC 5.9  --   --   --  5.3  --   --   CREATININE 0.58  --   --   --  0.58  --  0.66  LATICACIDVEN  --  1.47 4.27* 2.5*  --  1.3  --     Estimated Creatinine Clearance: 43.8 mL/min (by C-G formula based on SCr of 0.66 mg/dL).    No Known Allergies  Antimicrobials this admission:  10/7 >> vancomycin 10/7 >> Zosyn  Dose adjustments this admission:  10/9 Switched vanco from 1g q24h to '500mg'$  q12h based on latest patient weight  Microbiology results:  10/7 BCx x2: NGTD 10/7 MRSA PCR: neg   Thank you for allowing pharmacy to be a part of this patient's care.  Clayburn Pert, PharmD, BCPS Pager: [phone] 09/12/2016  9:59 AM

## 2016-09-12 NOTE — Telephone Encounter (Signed)
Pt currently admitted at [facility] ICU. See TE 09/12/16.

## 2016-09-13 ENCOUNTER — Telehealth: Payer: Self-pay | Admitting: Pulmonary Disease

## 2016-09-13 DIAGNOSIS — E43 Unspecified severe protein-calorie malnutrition: Secondary | ICD-10-CM | POA: Insufficient documentation

## 2016-09-13 DIAGNOSIS — J441 Chronic obstructive pulmonary disease with (acute) exacerbation: Secondary | ICD-10-CM

## 2016-09-13 DIAGNOSIS — J189 Pneumonia, unspecified organism: Secondary | ICD-10-CM

## 2016-09-13 DIAGNOSIS — E876 Hypokalemia: Secondary | ICD-10-CM

## 2016-09-13 DIAGNOSIS — E440 Moderate protein-calorie malnutrition: Secondary | ICD-10-CM

## 2016-09-13 DIAGNOSIS — J9601 Acute respiratory failure with hypoxia: Secondary | ICD-10-CM

## 2016-09-13 DIAGNOSIS — R5381 Other malaise: Secondary | ICD-10-CM

## 2016-09-13 DIAGNOSIS — J700 Acute pulmonary manifestations due to radiation: Principal | ICD-10-CM

## 2016-09-13 LAB — BASIC METABOLIC PANEL
ANION GAP: 9 (ref 5–15)
BUN: 17 mg/dL (ref 6–20)
CHLORIDE: 104 mmol/L (ref 101–111)
CO2: 28 mmol/L (ref 22–32)
Calcium: 8.4 mg/dL — ABNORMAL LOW (ref 8.9–10.3)
Creatinine, Ser: 0.68 mg/dL (ref 0.44–1.00)
GFR calc Af Amer: >60 mL/min (ref >60)
GFR calc non Af Amer: >60 mL/min (ref >60)
GLUCOSE: 139 mg/dL — AB (ref 65–99)
POTASSIUM: 4.1 mmol/L (ref 3.5–5.1)
SODIUM: 141 mmol/L (ref 135–145)

## 2016-09-13 MED ORDER — DILTIAZEM HCL 60 MG PO TABS
60.0000 mg | ORAL_TABLET | Freq: Three times a day (TID) | ORAL | Status: DC
Start: 2016-09-13 — End: 2016-09-15
  Administered 2016-09-13 – 2016-09-15 (×7): 60 mg via ORAL
  Filled 2016-09-13 (×7): qty 1

## 2016-09-13 MED ORDER — AMOXICILLIN-POT CLAVULANATE 875-125 MG PO TABS
1.0000 | ORAL_TABLET | Freq: Two times a day (BID) | ORAL | Status: DC
  Administered 2016-09-13 – 2016-09-15 (×5): 1 via ORAL
  Filled 2016-09-13 (×5): qty 1

## 2016-09-13 NOTE — Progress Notes (Signed)
Physical Therapy Treatment Patient Details Name: Carol [name] MRN: [id number] DOB: 1932/10/27 Today's Date: 09/13/2016    History of Present Illness Pt admitted with respiratory failure 2* PNA and COPD exacerbation.  Pt with hx of CAD, COPD, AAA, and lung CA    PT Comments    Pt sitting EOB Indep on arrival on 4 lts sats at rest 96%.  Pt required + 2 assist to amb due to pt's anxiety and equipment.  Pt remained on 4 lts nasal sats decreased to 90-91% with noted 2/4 DOE.  Unable to wean O2 at this time.    Follow Up Recommendations  Home health PT     Equipment Recommendations  None recommended by PT    Recommendations for Other Services       Precautions / Restrictions Precautions Precautions: Fall Precaution Comments: required 4 lts nasal O2 Restrictions Weight Bearing Restrictions: No    Mobility  Bed Mobility               General bed mobility comments: NT   Pt sitting EOB Indep on arrival  Transfers Overall transfer level: Needs assistance Equipment used: Rolling walker (2 wheeled) Transfers: Sit to/from Stand Sit to Stand: +2 safety/equipment;Mod assist;Max assist         General transfer comment: cues for transition position and use of UEs to self assist.  required x 2 attempts as B knees buckled with first  Ambulation/Gait Ambulation/Gait assistance: Min assist;+2 physical assistance;+2 safety/equipment Ambulation Distance (Feet): 47 Feet Assistive device: Rolling walker (2 wheeled) Gait Pattern/deviations: Step-to pattern;Step-through pattern;Decreased stride length;Trunk flexed Gait velocity: decr   General Gait Details: required 4 lt.  Noted 2/4 DOE. O2 to achieve 90-91% with avg HR 109.  Limited activity tolerance   Stairs            Wheelchair Mobility    Modified Rankin (Stroke Patients Only)       Balance                                    Cognition Arousal/Alertness: Awake/alert Behavior During Therapy:  WFL for tasks assessed/performed;Anxious Overall Cognitive Status: Within Functional Limits for tasks assessed                      Exercises      General Comments        Pertinent Vitals/Pain Pain Assessment: No/denies pain    Home Living                      Prior Function            PT Goals (current goals can now be found in the care plan section) Progress towards PT goals: Progressing toward goals    Frequency    Min 3X/week      PT Plan Current plan remains appropriate    Co-evaluation             End of Session Equipment Utilized During Treatment: Gait belt;Oxygen Activity Tolerance: Patient limited by fatigue Patient left: in chair;with call bell/phone within reach     Time: 1028-1052 PT Time Calculation (min) (ACUTE ONLY): 24 min  Charges:  $Gait Training: 8-22 mins $Therapeutic Activity: 8-22 mins                    G Codes:      Rica Koyanagi  PTA  [facility]  Acute  Rehab Pager      [phone]

## 2016-09-13 NOTE — Care Management Note (Signed)
Case Management Note  Patient Details  Name: Carol [name] MRN: [id number] Date of Birth: 04-Feb-1932  Subjective/Objective:  [facility] aware of [place] orders. Await if home 02 needed, & orders.                  Action/Plan:d/c home w/HHC.   Expected Discharge Date:                  Expected Discharge Plan:  [place]  In-House Referral:     Discharge planning Services  CM Consult  Post Acute Care Choice:  Durable Medical Equipment (rw,raised toilet seat) Choice offered to:  Patient  DME Arranged:    DME Agency:     HH Arranged:  RN, PT, Nurse's Aide [place] Agency:  [facility]  Status of Service:  In process, will continue to follow  If discussed at Long Length of Stay Meetings, dates discussed:    Additional Comments:  Dessa Phi, RN 09/13/2016, 11:39 AM

## 2016-09-13 NOTE — Progress Notes (Signed)
[facility] PROGRESS NOTE  Carol [name] IOX:[id number] DOB: 1932-11-17 DOA: 09/10/2016 PCP: [name], DO  Interim summary and HPI 82 y.o. female, With history of non-small cell lung cancer, esophageal stricture status post dilation, COPD, diastolic heart failure, atrial fibrillation, hypertension who came to the hospital for worsening shortness of breath and chest pain ongoing for past 1 week. Patient underwent chemotherapy and radiation for left lung mass, hard last radiation treatment was in July. Patient was diagnosed with radiation pneumonitis and was prescribed steroids, and the dose of steroid was tapered to 20 mg daily she started having symptoms of pain and shortness of breath. She denies fever, has been coughing, but unable to cough up phlegm.  In the ED CT chest shows ground glass opacity near the left lower lobe mass, inflammatory versus infectious. Patient empirically started on vancomycin and Zosyn for possible healthcare associated pneumonia. Also found to be in A. fib with RVR, uncontrolled hypertension.  Assessment/Plan: 1. Acute hypoxic respiratory failure- secondary to COPD exacerbation and Pneumonia. Will continue Solu-Medrol 60 mg IV every 8 hours now (appreciate PCCM inputs; will need slow tapering), Xopenex neb q6 hours, along with ipratropium nebs every 6 hours. Will also continue mucinex 1 tablet by mouth twice a day. Will continue pulmicort and flutter valve. Continue antibiotics as described below, now transition to PO regimen. 2. Healthcare associated pneumonia versus radiation pneumonitis- CT chest showed groundglass opacity near the left lower lobe, radiation pneumonitis versus infectious etiology considered. Will transition to antibiotics oral regimen (Augmentin) and will follow culture data. Continue flutter valve and pulmicort. Will weaned O2 supplementation as tolerated and assess for home oxygen requirement.  3. Atrial fibrillation with RVR- will continue  cardizem PO; HR much better controlled, but still with episodes of HR in 115's-120's. Will adjust to '80mg'$  TID and will plan to transition to extended release form once dose demonstrated complete stabilization of HR. Continue ASA, no anticoagulation candidate given age and fall risk. CHADsVASC score 3  4. Hypertension- stable overall. Continue on Cardizem, will monitor and adjust as needed. 5. Non-small cell lung cancer of left lung-patient is status post chemotherapy, radiation treatment. will continue follow up by oncology as outpatient 6. History of esophageal stricture- status post dilation, no odynophagia at this time. Continue PPI. 7. Moderate protein calorie malnutrition: nutrition service on board, will follow rec's on feeding supplements. 8. Hypokalemia: replete and WNL currently; will monitor intermittently    Code Status: DNR Family Communication: no family at bedside  Disposition Plan: will transfer to telemetry bed; transition to oral diltiazem; will continue steroids (starting tapering now), nebulizer treatment and current antibiotics. Will follow clinical response.   Consultants:  None   Procedures:  See below for x-ray reports   Antibiotics:  Vancomycin and zosyn: 09/10/16  HPI/Subjective: Breathing continue to improved according to patient. Denies CP and palpitations. Patient is also afebrile.  Objective: Vitals:   09/12/16 2139 09/13/16 0556  BP: (!) 147/53 (!) 171/96  Pulse: (!) 102 (!) 52  Resp: 17 18  Temp: 97.3 F (36.3 C) 97.7 F (36.5 C)    Intake/Output Summary (Last 24 hours) at 09/13/16 1017 Last data filed at 09/13/16 0909  Gross per 24 hour  Intake              760 ml  Output              825 ml  Net              -65 ml  Filed Weights   09/10/16 1332  Weight: 50.3 kg (111 lb)    Exam:   General:  Frail and chronically ill in appearance. Afebrile, no CP; reports no palpitations; endorses that her breathing continue improving. Capable  to speak in almost full sentences; but still requiring O2 [place] (4L) supplementation and desaturated to 84% with ambulation on RA this morning.   Cardiovascular: irregular, no rubs, no gallops, no JVD  Respiratory: mild exp wheezing, positive rhonchi, improvement in air flow, no crackles. No using accessory muscles currently.  Abdomen: soft, NT, ND, positive BS  Musculoskeletal: no cyanosis, no clubbing   Data Reviewed: Basic Metabolic Panel:  Recent Labs Lab 09/10/16 1349 09/11/16 0330 09/12/16 0321 09/13/16 0511  NA 138 139 138 141  K 3.6 3.7 3.2* 4.1  CL 102 103 101 104  CO2 '26 25 27 28  '$ GLUCOSE 134* 172* 171* 139*  BUN '19 16 19 17  '$ CREATININE 0.58 0.58 0.66 0.68  CALCIUM 8.4* 8.1* 8.3* 8.4*   Liver Function Tests:  Recent Labs Lab 09/11/16 0330  AST 30  ALT 37  ALKPHOS 100  BILITOT 0.6  PROT 6.8  ALBUMIN 3.0*   CBC:  Recent Labs Lab 09/10/16 1349 09/11/16 0330  WBC 5.9 5.3  HGB 13.0 12.2  HCT 39.2 38.1  MCV 87.3 89.6  PLT 231 207   ProBNP (last 3 results)  Recent Labs  08/01/16 1048  PROBNP 106.0*    CBG: No results for input(s): GLUCAP in the last 168 hours.  Recent Results (from the past 240 hour(s))  Blood culture (routine x 2)     Status: None (Preliminary result)   Collection Time: 09/10/16  1:48 PM  Result Value Ref Range Status   Specimen Description BLOOD RIGHT ANTECUBITAL  Final   Special Requests BOTTLES DRAWN AEROBIC AND ANAEROBIC 5CC  Final   Culture   Final    NO GROWTH 2 DAYS Performed at [facility]    Report Status PENDING  Incomplete  Blood culture (routine x 2)     Status: None (Preliminary result)   Collection Time: 09/10/16  4:13 PM  Result Value Ref Range Status   Specimen Description BLOOD RIGHT ANTECUBITAL  Final   Special Requests BOTTLES DRAWN AEROBIC AND ANAEROBIC 5CC  Final   Culture   Final    NO GROWTH 2 DAYS Performed at [facility]    Report Status PENDING  Incomplete  MRSA PCR  Screening     Status: None   Collection Time: 09/10/16  6:45 PM  Result Value Ref Range Status   MRSA by PCR NEGATIVE NEGATIVE Final    Comment:        The GeneXpert MRSA Assay (FDA approved for NASAL specimens only), is one component of a comprehensive MRSA colonization surveillance program. It is not intended to diagnose MRSA infection nor to guide or monitor treatment for MRSA infections.      Studies: No results found.  Scheduled Meds: . amoxicillin-clavulanate  1 tablet Oral Q12H  . aspirin EC  81 mg Oral Daily  . atorvastatin  10 mg Oral q1800  . budesonide (PULMICORT) nebulizer solution  0.25 mg Nebulization BID  . diltiazem  60 mg Oral Q8H  . enoxaparin (LOVENOX) injection  40 mg Subcutaneous Q24H  . feeding supplement (ENSURE ENLIVE)  237 mL Oral BID BM  . furosemide  20 mg Oral Daily  . guaiFENesin  600 mg Oral BID  . Influenza vac split quadrivalent PF  0.5 mL Intramuscular Tomorrow-1000  . ipratropium  0.5 mg Nebulization TID  . levalbuterol  0.63 mg Nebulization TID  . mouth rinse  15 mL Mouth Rinse BID  . methylPREDNISolone (SOLU-MEDROL) injection  60 mg Intravenous Q8H  . multivitamin with minerals  1 tablet Oral Daily  . pantoprazole  40 mg Oral Daily  . sodium chloride flush  3 mL Intravenous Q12H   Continuous Infusions:    Active Problems:   HTN (hypertension)   Hypoxia   Dysphagia   History of esophageal stricture   Mass of lower lobe of left lung   Non-small cell cancer of left lung (HCC)   Radiation pneumonitis (HCC)   COPD exacerbation (HCC)   Malnutrition of moderate degree    Time spent: 25 minutes (> 50% of the time dedicated to face to evaluation, medical records reviewed, decision making and coordinating/explaining plan of care)    Barton Dubois  [facility] Pager [phone]. If 7PM-7AM, please contact night-coverage at [email or web address], password [facility] 09/13/2016, 10:17 AM  LOS: 3 days

## 2016-09-13 NOTE — Care Management Note (Signed)
Case Management Note  Patient Details  Name: Allegra Cerniglia MRN: [id number] Date of Birth: 05-31-1932  Subjective/Objective:  82 y/o f admitted w/Hypoxia. From home w/family. Has rw,raised toilet seat. On 02, noted 02 sats-currently qualifies for home 02-[facility] DME already following-awaiting home 02 order.[facility] rep aware, & accepted for HHPT.Await HHPT,f45forder.                  Action/Plan:d/c home w/HHC.   Expected Discharge Date:                  Expected Discharge Plan:  [facility] Referral:     Discharge planning Services  CM Consult  Post Acute Care Choice:    Choice offered to:  Patient  DME Arranged:    DME Agency:     HH Arranged:    [facility]  [facility] Status of Service:  In process, will continue to follow  If discussed at Long Length of Stay Meetings, dates discussed:    Additional Comments:  MDessa Phi RN 09/13/2016, 11:36 AM

## 2016-09-13 NOTE — Progress Notes (Signed)
SATURATION QUALIFICATIONS: (This note is used to comply with regulatory documentation for home oxygen)  Patient Saturations on Room Air at Rest = 88-92%  Patient Saturations on Room Air while Ambulating = 84  Patient Saturations on 4 Liters of oxygen while Ambulating = 94-98%  Please briefly explain why patient needs home oxygen: Pt ambulated with O2 off on RA sat decreased to 84%. Pt O2 replaced to 4 liters sats  ranged from 94-98%.   SRP, RN

## 2016-09-13 NOTE — Consult Note (Signed)
PULMONARY / CRITICAL CARE MEDICINE   Name: Carol [name] MRN: [id number] DOB: 10-08-1932    ADMISSION DATE:  09/10/2016 CONSULTATION DATE:  09/13/2016  REFERRING MD:  Dr. Dyann Kief, [facility]  CHIEF COMPLAINT:  Short of breath  HISTORY OF PRESENT ILLNESS:   82 yo female former smoker presented with dyspnea, chest pain, and weakness for about 3 weeks.  She was also noted to have SpO2 84% on RA.  She has hx of NSCLC lung cancer and had last XRT/chemo 7 weeks prior to admission.  She was dx'ed with radiation pneumonitis and started on prednisone >> her symptoms progressed once her prednisone dose was decreased to 20 mg.  There was concern for HCAP and she was started on antibiotics.  She is followed by Dr. Ashok Cordia in pulmonary office for COPD.  She was noted to have A fib with RVR and started on cardizem.  PAST MEDICAL HISTORY :  She  has a past medical history of Abdominal aortic aneurysm (AAA) ([place]); Cataract; COPD (chronic obstructive pulmonary disease) ([place]); Coronary artery disease; Encounter for antineoplastic chemotherapy (05/16/2016); Fall; GERD (gastroesophageal reflux disease); Hiatal hernia; History of esophageal stricture; Hypertension; Lung cancer ([place]); Osteoporosis; and Peripheral arterial disease ([place]).  PAST SURGICAL HISTORY: She  has a past surgical history that includes Eye surgery; Colonoscopy; Esophagogastroduodenoscopy (egd) with esophageal dilation; Cataract extraction; esopheal dilation (02-26-16); and Endobronchial ultrasound (Bilateral, 03/28/2016).  No Known Allergies  No current facility-administered medications on file prior to encounter.    Current Outpatient Prescriptions on File Prior to Encounter  Medication Sig  . albuterol (PROVENTIL HFA) 108 (90 Base) MCG/ACT inhaler Inhale 2 puffs into the lungs 2 (two) times daily as needed for shortness of breath.  Marland Kitchen amLODipine (NORVASC) 5 MG tablet Take 1 tablet (5 mg total) by mouth daily. (Patient taking differently: Take 5 mg  by mouth every morning. )  . aspirin EC 81 MG tablet Take 81 mg by mouth every morning.   Marland Kitchen atorvastatin (LIPITOR) 10 MG tablet Take 1 tablet (10 mg total) by mouth daily at 6 PM. Reported on 01/29/2016  . budesonide-formoterol (SYMBICORT) 160-4.5 MCG/ACT inhaler Inhale 2 puffs into the lungs 2 (two) times daily.  Marland Kitchen omeprazole (PRILOSEC) 40 MG capsule Take 1 capsule (40 mg total) by mouth 2 (two) times daily.  Marland Kitchen oxycodone (OXY-IR) 5 MG capsule Take 1 capsule (5 mg total) by mouth every 4 (four) hours as needed. (Patient taking differently: Take 5 mg by mouth every 4 (four) hours as needed for pain. )  . predniSONE (DELTASONE) 10 MG tablet Take 4 tablets (40 mg total) by mouth daily with breakfast.  . Sennosides (EX-LAX) 15 MG TABS Take 15 mg by mouth daily as needed (For constipation.).   Marland Kitchen Tiotropium Bromide Monohydrate (SPIRIVA RESPIMAT) 2.5 MCG/ACT AERS Inhale 2 puffs into the lungs daily. (Patient taking differently: Inhale 2 puffs into the lungs every morning. )  . HYDROcodone-acetaminophen (HYCET) 7.5-325 mg/15 ml solution Take 15 mLs by mouth 4 (four) times daily as needed for moderate pain. (Patient not taking: Reported on 09/10/2016)    FAMILY HISTORY:  Her indicated that her mother is deceased. She indicated that her father is deceased. She indicated that her sister is alive. She indicated that the status of her brother is unknown. She indicated that her maternal grandmother is deceased. She indicated that her maternal grandfather is deceased. She indicated that her paternal grandmother is deceased. She indicated that her paternal grandfather is deceased. She indicated that the status of her  neg hx is unknown.    SOCIAL HISTORY: She  reports that she quit smoking about 7 months ago. Her smoking use included Cigarettes. She has a 90.00 pack-year smoking history. She has never used smokeless tobacco. She reports that she drinks alcohol. She reports that she does not use drugs.  REVIEW OF  SYSTEMS:   Chest pain decreased.  Feels weak, but improved.  Breathing improved.  SUBJECTIVE:   VITAL SIGNS: BP (!) 171/96 (BP Location: Right Arm)   Pulse (!) 52   Temp 97.7 F (36.5 C) (Oral)   Resp 18   Ht '5\' 3"'$  (1.6 m)   Wt 111 lb (50.3 kg)   SpO2 94%   BMI 19.66 kg/m   INTAKE / OUTPUT: I/O last 3 completed shifts: In: 1151.8 [P.O.:700; I.V.:151.8; IV Piggyback:300] Out: 1625 [Urine:1625]  PHYSICAL EXAMINATION: General:  Alert, pleasant Neuro:  Normal strength, CN intact HEENT:  No sinus tenderness, no stridor, no LAN Cardiovascular:  Regular, no murmur Lungs:  Decreased BS, no wheeze, faint crackles Lt base Abdomen:  Soft, non tender Musculoskeletal:  Decreased muscle bulk, no edema Skin:  No rashes  LABS: CMP Latest Ref Rng & Units 09/13/2016 09/12/2016 09/11/2016  Glucose 65 - 99 mg/dL 139(H) 171(H) 172(H)  BUN 6 - 20 mg/dL '17 19 16  '$ Creatinine 0.44 - 1.00 mg/dL 0.68 0.66 0.58  Sodium 135 - 145 mmol/L 141 138 139  Potassium 3.5 - 5.1 mmol/L 4.1 3.2(L) 3.7  Chloride 101 - 111 mmol/L 104 101 103  CO2 22 - 32 mmol/L '28 27 25  '$ Calcium 8.9 - 10.3 mg/dL 8.4(L) 8.3(L) 8.1(L)  Total Protein 6.5 - 8.1 g/dL - - 6.8  Total Bilirubin 0.3 - 1.2 mg/dL - - 0.6  Alkaline Phos 38 - 126 U/L - - 100  AST 15 - 41 U/L - - 30  ALT 14 - 54 U/L - - 37    CBC Latest Ref Rng & Units 09/11/2016 09/10/2016 06/20/2016  WBC 4.0 - 10.5 K/uL 5.3 5.9 3.0(L)  Hemoglobin 12.0 - 15.0 g/dL 12.2 13.0 13.0  Hematocrit 36.0 - 46.0 % 38.1 39.2 38.9  Platelets 150 - 400 K/uL 207 231 245    Dg Chest 2 View  Result Date: 09/10/2016 CLINICAL DATA:  History of lung carcinoma. Chest pain, cough and congestion for 4 weeks. EXAM: CHEST  2 VIEW COMPARISON:  09/07/2016 FINDINGS: Left suprahilar opacity, with linear opacity extending to the lateral pleural margin, is stable consistent with lung cancer treatment related scarring. Lungs are hyperexpanded but otherwise clear. No pleural effusion or  pneumothorax. Cardiac silhouette is top-normal in size. No mediastinal or right hilar masses. No convincing adenopathy. Skeletal structures are demineralized. There is mild depression of the upper endplate of an upper thoracic vertebra, stable. IMPRESSION: 1. No acute cardiopulmonary disease. No change from the prior study. Electronically Signed   By: Lajean Manes M.D.   On: 09/10/2016 14:55   Ct Angio Chest Pe W And/or Wo Contrast  Result Date: 09/10/2016 CLINICAL DATA:  patient has had increased shortness of breath/chest pain/weakness over the last 3 weeks. 84% oxygen on room air. Hx of lung cancer. Last chemo/radiation was 6-7 weeks ago. EXAM: CT ANGIOGRAPHY CHEST WITH CONTRAST TECHNIQUE: Multidetector CT imaging of the chest was performed using the standard protocol during bolus administration of intravenous contrast. Multiplanar CT image reconstructions and MIPs were obtained to evaluate the vascular anatomy. CONTRAST:  100 mL of Isovue 370 intravenous contrast COMPARISON:  Current chest radiograph.  Chest  CTA, 08/03/2016. FINDINGS: Cardiovascular: Study is suboptimal for evaluation of pulmonary embolus. There is less enhancement of the pulmonary arteries in of the aorta. Allowing for the above limitation, there is no convincing pulmonary embolus. The heart is normal in size and configuration. There is a stable intra atrial lipoma. There are dense coronary artery calcifications. Dense atherosclerotic calcifications are noted along the aortic arch and the arch branch vessels. There is a moderate to severe stenosis of the left common carotid artery at its origin. There is a severe stenosis versus occlusion of the left subclavian artery. The aorta is ectatic. No aneurysm. No dissection. Mediastinum/Nodes: No mediastinal or hilar masses or adenopathy. Trachea and esophagus are unremarkable. Lungs/Pleura: Focal opacity in the superior segment left lower lobe is without significant change from the prior CT.  There is a band of opacity at the level of the superior left hilum extends across the left upper lobe to the left lower lobe superior segment. This is consistent with radiation treatment related scarring. There is also similar to the prior CT. There is additional ground-glass and coarse reticular type opacity in the left lower lobe, below the superior segment. This is new. It may reflect new scarring or pneumonia/ pneumonitis. Elsewhere, the lungs show stable areas of peripheral interstitial thickening. Changes of emphysema are stable. No pulmonary edema. No pleural effusion or pneumothorax. Upper Abdomen: No acute abnormality. Musculoskeletal: No osteoblastic or osteolytic lesions. Review of the MIP images confirms the above findings. IMPRESSION: 1. Study is suboptimal for the evaluation of pulmonary emboli with lower enhancement in the pulmonary arteries than in the aorta. Allowing for this, there is no evidence of a pulmonary embolus. 2. New area of ground-glass and reticular type opacity in the mid aspect of the left lower lobe. This may reflect infection or post radiation induced inflammation. No other evidence of an acute abnormality within the chest. 3. Focal opacity in the superior segment left lower lobe with a bandlike area of opacity across the left upper lobe and superior segment left lower lobe is stable reflecting treatment related scarring. 4. Significant atherosclerotic disease of the aorta and branch vessels and significant coronary artery calcifications. Electronically Signed   By: Lajean Manes M.D.   On: 09/10/2016 16:04    STUDIES:  Echo 01/30/16 >> EF 65 to 01%, grade 1 diastolic CHF, mild MR PFT 03/11/16 >> FEV1 0.95 (50%), FEV1% 50, TLC 4.96 (98%), DLCO 23% Needle Bx LLL mass 04/08/16 >> NSCLC (Squamous cell) CT angio chest 09/10/16 >> XRT changes Lt hilum to LUL and LLL, new GGO LLL, centrilobular emphysema  CULTURES: Blood 10/07 >>  ANTIBIOTICS: Vancomycin 10/07 >> Zosyn 10/07  >>  ASSESSMENT / PLAN:  Acute hypoxic respiratory failure 2nd to HCAP, radiation pneumonitis, and AECOPD. - oxygen to keep SpO2 90 to 95% >> assess for home oxygen set up - pulmicort, atrovent, xopenex - continue solumedrol >> using for AECOPD and radiation pneumonitis; likely will need to do slow taper  A fib with RVR. - per primary team  Hx of Stage IIIA (T2b, N2, M0) NSCLC - Squamous cell. - last chemo 06/20/16 >> carboplatin, paclitaxel - last XRT 06/24/16 - f/u with oncology, radiation oncology as outpt  Goals of care. - DNR/DNI  Updated pt's son in law at bedside.  Chesley Mires, MD [facility] Pulmonary/Critical Care 09/13/2016, 8:38 AM Pager:  [phone] After 3pm call: [phone]

## 2016-09-13 NOTE — Telephone Encounter (Signed)
Spoke with pt's son-in-law, [name]. States that from the time he left this message, he spoke with Dr. Halford Chessman at the hospital. They need to have the MRI we scheduled canceled per VS. I will call [facility] radiology and get this canceled. Nothing further was needed.

## 2016-09-14 ENCOUNTER — Ambulatory Visit (HOSPITAL_COMMUNITY): Payer: Medicare Other

## 2016-09-14 DIAGNOSIS — J189 Pneumonia, unspecified organism: Secondary | ICD-10-CM

## 2016-09-14 DIAGNOSIS — R1310 Dysphagia, unspecified: Secondary | ICD-10-CM

## 2016-09-14 LAB — CBC
HCT: 33.6 % — ABNORMAL LOW (ref 36.0–46.0)
HEMOGLOBIN: 10.7 g/dL — AB (ref 12.0–15.0)
MCH: 28.7 pg (ref 26.0–34.0)
MCHC: 31.8 g/dL (ref 30.0–36.0)
MCV: 90.1 fL (ref 78.0–100.0)
PLATELETS: 217 10*3/uL (ref 150–400)
RBC: 3.73 MIL/uL — ABNORMAL LOW (ref 3.87–5.11)
RDW: 18.8 % — AB (ref 11.5–15.5)
WBC: 6.1 10*3/uL (ref 4.0–10.5)

## 2016-09-14 LAB — BASIC METABOLIC PANEL
Anion gap: 10 (ref 5–15)
BUN: 20 mg/dL (ref 6–20)
CALCIUM: 8.4 mg/dL — AB (ref 8.9–10.3)
CHLORIDE: 98 mmol/L — AB (ref 101–111)
CO2: 33 mmol/L — ABNORMAL HIGH (ref 22–32)
CREATININE: 0.65 mg/dL (ref 0.44–1.00)
Glucose, Bld: 153 mg/dL — ABNORMAL HIGH (ref 65–99)
Potassium: 2.8 mmol/L — ABNORMAL LOW (ref 3.5–5.1)
SODIUM: 141 mmol/L (ref 135–145)

## 2016-09-14 MED ORDER — BISACODYL 10 MG RE SUPP: 10.0000 mg | Freq: Every day | RECTAL | Status: DC | PRN

## 2016-09-14 MED ORDER — PREDNISONE 20 MG PO TABS
40.0000 mg | ORAL_TABLET | Freq: Every day | ORAL | Status: DC
  Administered 2016-09-14 – 2016-09-15 (×2): 40 mg via ORAL
  Filled 2016-09-14 (×3): qty 2

## 2016-09-14 MED ORDER — SENNOSIDES-DOCUSATE SODIUM 8.6-50 MG PO TABS
1.0000 | ORAL_TABLET | Freq: Two times a day (BID) | ORAL | Status: DC
  Administered 2016-09-14 – 2016-09-15 (×2): 1 via ORAL
  Filled 2016-09-14 (×2): qty 1

## 2016-09-14 MED ORDER — POTASSIUM CHLORIDE CRYS ER 20 MEQ PO TBCR
40.0000 meq | EXTENDED_RELEASE_TABLET | Freq: Three times a day (TID) | ORAL | Status: AC
  Administered 2016-09-14 (×3): 40 meq via ORAL
  Filled 2016-09-14 (×3): qty 2

## 2016-09-14 NOTE — Progress Notes (Signed)
SATURATION QUALIFICATIONS: (This note is used to comply with regulatory documentation for home oxygen)  Patient Saturations on Room Air at Rest = 92%  Patient Saturations on Room Air while Ambulating = 81%  Patient Saturations on 4 Liters of oxygen while Ambulating = 93%  Please briefly explain why patient needs home oxygen: pt desats without it

## 2016-09-14 NOTE — Care Management Important Message (Signed)
Important Message  Patient Details  Name: Carol [name] MRN: [id number] Date of Birth: 1932/11/01   Medicare Important Message Given:  Yes    Camillo Flaming 09/14/2016, 9:52 AMImportant Message  Patient Details  Name: Carol [name] MRN: [id number] Date of Birth: May 08, 1932   Medicare Important Message Given:  Yes    Camillo Flaming 09/14/2016, 9:52 AM

## 2016-09-14 NOTE — Progress Notes (Addendum)
PULMONARY / CRITICAL CARE MEDICINE   Name: Carol [name] MRN: [id number] DOB: [birth year]    ADMISSION DATE:  09/10/2016 CONSULTATION DATE:  09/13/2016  REFERRING MD:  Dr. Dyann Kief, [facility]  CHIEF COMPLAINT:  Short of breath  HISTORY OF PRESENT ILLNESS:   82 yo female former smoker presented with dyspnea, chest pain, and weakness for about 3 weeks.  She was also noted to have SpO2 84% on RA.  She has hx of NSCLC lung cancer and had last XRT/chemo in July 2017.  She was dx'ed with radiation pneumonitis and started on prednisone >> her symptoms progressed once her prednisone dose was decreased to 20 mg.  There was concern for HCAP and she was started on antibiotics.  She is followed by Dr. Ashok Cordia in pulmonary office for COPD.  She was noted to have A fib with RVR and started on cardizem.  SUBJECTIVE:  Breathing better.  Denies chest/back pain.  Cough better, and not bringing up sputum.  VITAL SIGNS: BP (!) 178/87 (BP Location: Right Arm) Comment: RN NOTIFIED  Pulse 96   Temp 97.7 F (36.5 C) (Oral)   Resp (!) 24   Ht '5\' 3"'$  (1.6 m)   Wt 111 lb (50.3 kg)   SpO2 97%   BMI 19.66 kg/m   INTAKE / OUTPUT: I/O last 3 completed shifts: In: 960 [P.O.:810; IV Piggyback:150] Out: 1500 [Urine:1500]  PHYSICAL EXAMINATION: General:  Alert, pleasant Neuro:  Normal strength HEENT:  No sinus tenderness, no stridor, no LAN Cardiovascular:  Regular, no murmur Lungs:  Decreased BS, no wheeze Abdomen:  Soft, non tender Musculoskeletal:  Decreased muscle bulk, no edema Skin:  No rashes  LABS: CMP Latest Ref Rng & Units 09/14/2016 09/13/2016 09/12/2016  Glucose 65 - 99 mg/dL 153(H) 139(H) 171(H)  BUN 6 - 20 mg/dL '20 17 19  '$ Creatinine 0.44 - 1.00 mg/dL 0.65 0.68 0.66  Sodium 135 - 145 mmol/L 141 141 138  Potassium 3.5 - 5.1 mmol/L 2.8(L) 4.1 3.2(L)  Chloride 101 - 111 mmol/L 98(L) 104 101  CO2 22 - 32 mmol/L 33(H) 28 27  Calcium 8.9 - 10.3 mg/dL 8.4(L) 8.4(L) 8.3(L)  Total Protein 6.5 - 8.1 g/dL  - - -  Total Bilirubin 0.3 - 1.2 mg/dL - - -  Alkaline Phos 38 - 126 U/L - - -  AST 15 - 41 U/L - - -  ALT 14 - 54 U/L - - -    CBC Latest Ref Rng & Units 09/14/2016 09/11/2016 09/10/2016  WBC 4.0 - 10.5 K/uL 6.1 5.3 5.9  Hemoglobin 12.0 - 15.0 g/dL 10.7(L) 12.2 13.0  Hematocrit 36.0 - 46.0 % 33.6(L) 38.1 39.2  Platelets 150 - 400 K/uL 217 207 231    Dg Chest 2 View  Result Date: 09/10/2016 CLINICAL DATA:  History of lung carcinoma. Chest pain, cough and congestion for 4 weeks. EXAM: CHEST  2 VIEW COMPARISON:  09/07/2016 FINDINGS: Left suprahilar opacity, with linear opacity extending to the lateral pleural margin, is stable consistent with lung cancer treatment related scarring. Lungs are hyperexpanded but otherwise clear. No pleural effusion or pneumothorax. Cardiac silhouette is top-normal in size. No mediastinal or right hilar masses. No convincing adenopathy. Skeletal structures are demineralized. There is mild depression of the upper endplate of an upper thoracic vertebra, stable. IMPRESSION: 1. No acute cardiopulmonary disease. No change from the prior study. Electronically Signed   By: Lajean Manes M.D.   On: 09/10/2016 14:55   Ct Angio Chest Pe W And/or Wo Contrast  Result Date: 09/10/2016 CLINICAL DATA:  patient has had increased shortness of breath/chest pain/weakness over the last 3 weeks. 84% oxygen on room air. Hx of lung cancer. Last chemo/radiation was 6-7 weeks ago. EXAM: CT ANGIOGRAPHY CHEST WITH CONTRAST TECHNIQUE: Multidetector CT imaging of the chest was performed using the standard protocol during bolus administration of intravenous contrast. Multiplanar CT image reconstructions and MIPs were obtained to evaluate the vascular anatomy. CONTRAST:  100 mL of Isovue 370 intravenous contrast COMPARISON:  Current chest radiograph.  Chest CTA, 08/03/2016. FINDINGS: Cardiovascular: Study is suboptimal for evaluation of pulmonary embolus. There is less enhancement of the pulmonary  arteries in of the aorta. Allowing for the above limitation, there is no convincing pulmonary embolus. The heart is normal in size and configuration. There is a stable intra atrial lipoma. There are dense coronary artery calcifications. Dense atherosclerotic calcifications are noted along the aortic arch and the arch branch vessels. There is a moderate to severe stenosis of the left common carotid artery at its origin. There is a severe stenosis versus occlusion of the left subclavian artery. The aorta is ectatic. No aneurysm. No dissection. Mediastinum/Nodes: No mediastinal or hilar masses or adenopathy. Trachea and esophagus are unremarkable. Lungs/Pleura: Focal opacity in the superior segment left lower lobe is without significant change from the prior CT. There is a band of opacity at the level of the superior left hilum extends across the left upper lobe to the left lower lobe superior segment. This is consistent with radiation treatment related scarring. There is also similar to the prior CT. There is additional ground-glass and coarse reticular type opacity in the left lower lobe, below the superior segment. This is new. It may reflect new scarring or pneumonia/ pneumonitis. Elsewhere, the lungs show stable areas of peripheral interstitial thickening. Changes of emphysema are stable. No pulmonary edema. No pleural effusion or pneumothorax. Upper Abdomen: No acute abnormality. Musculoskeletal: No osteoblastic or osteolytic lesions. Review of the MIP images confirms the above findings. IMPRESSION: 1. Study is suboptimal for the evaluation of pulmonary emboli with lower enhancement in the pulmonary arteries than in the aorta. Allowing for this, there is no evidence of a pulmonary embolus. 2. New area of ground-glass and reticular type opacity in the mid aspect of the left lower lobe. This may reflect infection or post radiation induced inflammation. No other evidence of an acute abnormality within the chest. 3.  Focal opacity in the superior segment left lower lobe with a bandlike area of opacity across the left upper lobe and superior segment left lower lobe is stable reflecting treatment related scarring. 4. Significant atherosclerotic disease of the aorta and branch vessels and significant coronary artery calcifications. Electronically Signed   By: Lajean Manes M.D.   On: 09/10/2016 16:04    STUDIES:  Echo 01/30/16 >> EF 65 to 14%, grade 1 diastolic CHF, mild MR PFT 03/11/16 >> FEV1 0.95 (50%), FEV1% 50, TLC 4.96 (98%), DLCO 23% Needle Bx LLL mass 04/08/16 >> NSCLC (Squamous cell) CT angio chest 09/10/16 >> XRT changes Lt hilum to LUL and LLL, new GGO LLL, centrilobular emphysema  CULTURES: Blood 10/07 >>  ANTIBIOTICS: Vancomycin 10/07 >> 10/10 Zosyn 10/07 >> 10/10 Augmentin 10/10 >>   ASSESSMENT / PLAN:  Acute hypoxic respiratory failure 2nd to HCAP, radiation pneumonitis, and AECOPD. - oxygen to keep SpO2 90 to 95% >> will need to arrange for home oxygen set up - pulmicort, atrovent, xopenex - change to prednisone 40 mg daily on 10/11 >> using  for AECOPD and radiation pneumonitis; likely will need to do slowly taper - day 5 of Abx - f/u CXR 10/12  A fib with RVR. - per primary team  Hx of Stage IIIA (T2b, N2, M0) NSCLC - Squamous cell. - last chemo 06/20/16 >> carboplatin, paclitaxel - last XRT 06/24/16 - f/u with oncology, radiation oncology as outpt  Goals of care. - DNR/DNI  Likely will be ready for d/c home from pulmonary standpoint on 10/12 if she tolerates transition from IV to po steroids and CXR shows improvement on 10/12.  Chesley Mires, MD [facility] Pulmonary/Critical Care 09/14/2016, 9:10 AM Pager:  [phone] After 3pm call: [phone]

## 2016-09-14 NOTE — Progress Notes (Signed)
[facility] PROGRESS NOTES  Carol [name] BJY:[id number] DOB: 19-Oct-1932 DOA: 09/10/2016 PCP: [name], DO   Interim summary and HPI 82 y.o. female, With history of non-small cell lung cancer, esophageal stricture status post dilation, COPD, diastolic heart failure, atrial fibrillation, hypertension who came to the hospital for worsening shortness of breath and chest pain ongoing for past 1 week. Patient underwent chemotherapy and radiation for left lung mass, hard last radiation treatment was in July. Patient was diagnosed with radiation pneumonitis and was prescribed steroids, and the dose of steroid was tapered to 20 mg daily she started having symptoms of pain and shortness of breath. She denies fever, has been coughing, but unable to cough up phlegm.  In the ED CT chest shows ground glass opacity near the left lower lobe mass, inflammatory versus infectious. Patient empirically started on vancomycin and Zosyn for possible healthcare associated pneumonia. Also found to be in A. fib with RVR, uncontrolled hypertension.  HOSPITAL COURSE / ASSESSMENT AND PLAN: 1. Acute hypoxic respiratory failure- secondary to COPD exacerbation and Pneumonia. Will continue Solu-Medrol 60 mg IV every 8 hours now (appreciate PCCM inputs; will need slow tapering), Xopenex neb q6 hours, along with ipratropium nebs every 6 hours. Will also continue mucinex 1 tablet by mouth twice a day. Will continue pulmicort and flutter valve. Continue antibiotics as described below, now transition to PO regimen. 2. Healthcare associated pneumonia versus radiation pneumonitis- CT chest showed groundglass opacity near the left lower lobe, radiation pneumonitis versus infectious etiology considered. Will transition to antibiotics oral regimen (Augmentin) and will follow culture data. Continue flutter valve and pulmicort. Will weaned O2 supplementation as tolerated and assess for home oxygen requirement.  3. Atrial fibrillation  with RVR- will continue cardizem PO; HR much better controlled, but still with episodes of HR in 115's-120's. Will continue '60mg'$  TID and will plan to transition to extended release form once dose demonstrated complete stabilization of HR. Continue ASA, no anticoagulation candidate given age and fall risk. CHADsVASC score 3  4. Hypertension- stable overall. Continue on Cardizem, will monitor and adjust as needed. 5. Non-small cell lung cancer of left lung-patient is status post chemotherapy, radiation treatment. will continue follow up by oncology as outpatient 6. History of esophageal stricture- status post dilation, no odynophagia at this time. Continue PPI. 7. Moderate protein calorie malnutrition: nutrition service on board, will follow rec's on feeding supplements. 8. Hypokalemia: replete and WNL currently; will monitor intermittently   Code Status: DNR Family Communication: no family at bedside  Disposition Plan: will transfer to telemetry bed; transition to oral diltiazem; will continue steroids (starting tapering now), nebulizer treatment and current antibiotics. Will follow clinical response.  Consultants:  None   Procedures:  See below for x-ray reports   Antibiotics:  Vancomycin and zosyn: 09/10/16 Anti-infectives    Start     Dose/Rate Route Frequency Ordered Stop   09/13/16 1100  amoxicillin-clavulanate (AUGMENTIN) 875-125 MG per tablet 1 tablet     1 tablet Oral Every 12 hours 09/13/16 1005     09/12/16 2000  vancomycin (VANCOCIN) 500 mg in sodium chloride 0.9 % 100 mL IVPB  Status:  Discontinued     500 mg 100 mL/hr over 60 Minutes Intravenous Every 12 hours 09/12/16 1002 09/13/16 1005   09/11/16 1600  vancomycin (VANCOCIN) IVPB 1000 mg/200 mL premix  Status:  Discontinued     1,000 mg 200 mL/hr over 60 Minutes Intravenous Every 24 hours 09/10/16 1847 09/12/16 1002   09/11/16 0000  piperacillin-tazobactam (  ZOSYN) IVPB 3.375 g  Status:  Discontinued     3.375 g 12.5  mL/hr over 240 Minutes Intravenous Every 8 hours 09/10/16 1847 09/13/16 1005   09/10/16 1600  vancomycin (VANCOCIN) IVPB 1000 mg/200 mL premix     1,000 mg 200 mL/hr over 60 Minutes Intravenous  Once 09/10/16 1557 09/10/16 1808   09/10/16 1600  piperacillin-tazobactam (ZOSYN) 3.375 g in dextrose 5 % 50 mL IVPB     3.375 g 100 mL/hr over 30 Minutes Intravenous  Once 09/10/16 1557 09/10/16 1717         HPI/Subjective: HR better controlled now.  Breathing improving slowly. Denies CP and palpitations.   Objective: Vitals:   09/14/16 0532 09/14/16 0541  BP:  (!) 178/87  Pulse: 96   Resp: (!) 24   Temp: 97.7 F (36.5 C)     Intake/Output Summary (Last 24 hours) at 09/14/16 0743 Last data filed at 09/14/16 0724  Gross per 24 hour  Intake              350 ml  Output              875 ml  Net             -525 ml   Filed Weights   09/10/16 1332  Weight: 50.3 kg (111 lb)    DISCHARGE EXAM:  Exam:   General:  Frail and chronically ill in appearance. Afebrile, no CP; reports no palpitations; endorses that her breathing continue improving. Capable to speak in almost full sentences; but still requiring O2 [place] (4L) supplementation and desaturated to 84% with ambulation on RA this morning.   Cardiovascular: irregular, no rubs, no gallops, no JVD  Respiratory: mild exp wheezing, positive rhonchi, improvement in air flow, no crackles. No using accessory muscles currently.  Abdomen: soft, NT, ND, positive BS  Musculoskeletal: no cyanosis, no clubbing   Data Reviewed: Basic Metabolic Panel:  Recent Labs Lab 09/10/16 1349 09/11/16 0330 09/12/16 0321 09/13/16 0511 09/14/16 0524  NA 138 139 138 141 141  K 3.6 3.7 3.2* 4.1 2.8*  CL 102 103 101 104 98*  CO2 '26 25 27 28 '$ 33*  GLUCOSE 134* 172* 171* 139* 153*  BUN '19 16 19 17 20  '$ CREATININE 0.58 0.58 0.66 0.68 0.65  CALCIUM 8.4* 8.1* 8.3* 8.4* 8.4*   Liver Function Tests:  Recent Labs Lab 09/11/16 0330  AST 30  ALT 37   ALKPHOS 100  BILITOT 0.6  PROT 6.8  ALBUMIN 3.0*   CBC:  Recent Labs Lab 09/10/16 1349 09/11/16 0330 09/14/16 0524  WBC 5.9 5.3 6.1  HGB 13.0 12.2 10.7*  HCT 39.2 38.1 33.6*  MCV 87.3 89.6 90.1  PLT 231 207 217   ProBNP (last 3 results)  Recent Labs  08/01/16 1048  PROBNP 106.0*    CBG: No results for input(s): GLUCAP in the last 168 hours.  Recent Results (from the past 240 hour(s))  Blood culture (routine x 2)     Status: None (Preliminary result)   Collection Time: 09/10/16  1:48 PM  Result Value Ref Range Status   Specimen Description BLOOD RIGHT ANTECUBITAL  Final   Special Requests BOTTLES DRAWN AEROBIC AND ANAEROBIC 5CC  Final   Culture   Final    NO GROWTH 3 DAYS Performed at [facility]    Report Status PENDING  Incomplete  Blood culture (routine x 2)     Status: None (Preliminary result)   Collection Time: 09/10/16  4:13 PM  Result Value Ref Range Status   Specimen Description BLOOD RIGHT ANTECUBITAL  Final   Special Requests BOTTLES DRAWN AEROBIC AND ANAEROBIC 5CC  Final   Culture   Final    NO GROWTH 3 DAYS Performed at [facility]    Report Status PENDING  Incomplete  MRSA PCR Screening     Status: None   Collection Time: 09/10/16  6:45 PM  Result Value Ref Range Status   MRSA by PCR NEGATIVE NEGATIVE Final    Comment:        The GeneXpert MRSA Assay (FDA approved for NASAL specimens only), is one component of a comprehensive MRSA colonization surveillance program. It is not intended to diagnose MRSA infection nor to guide or monitor treatment for MRSA infections.     Studies: No results found.  Scheduled Meds: . amoxicillin-clavulanate  1 tablet Oral Q12H  . aspirin EC  81 mg Oral Daily  . atorvastatin  10 mg Oral q1800  . budesonide (PULMICORT) nebulizer solution  0.25 mg Nebulization BID  . diltiazem  60 mg Oral Q8H  . enoxaparin (LOVENOX) injection  40 mg Subcutaneous Q24H  . feeding supplement (ENSURE  ENLIVE)  237 mL Oral BID BM  . furosemide  20 mg Oral Daily  . guaiFENesin  600 mg Oral BID  . Influenza vac split quadrivalent PF  0.5 mL Intramuscular Tomorrow-1000  . ipratropium  0.5 mg Nebulization TID  . levalbuterol  0.63 mg Nebulization TID  . mouth rinse  15 mL Mouth Rinse BID  . methylPREDNISolone (SOLU-MEDROL) injection  60 mg Intravenous Q8H  . multivitamin with minerals  1 tablet Oral Daily  . pantoprazole  40 mg Oral Daily  . sodium chloride flush  3 mL Intravenous Q12H   Continuous Infusions:    Active Problems:   HTN (hypertension)   Hypoxia   Dysphagia   History of esophageal stricture   Mass of lower lobe of left lung   Non-small cell cancer of left lung (HCC)   Radiation pneumonitis (HCC)   COPD exacerbation (HCC)   Malnutrition of moderate degree  Time spent: 35 minutes (> 50% of the time dedicated to face to evaluation, medical records reviewed, decision making and coordinating/explaining plan of care)   [facility] Pager [phone]. If 7PM-7AM, please contact night-coverage at [email or web address], password [facility] 09/14/2016, 7:43 AM  LOS: 4 days

## 2016-09-15 ENCOUNTER — Inpatient Hospital Stay (HOSPITAL_COMMUNITY): Payer: Medicare Other

## 2016-09-15 LAB — BASIC METABOLIC PANEL
Anion gap: 6 (ref 5–15)
BUN: 19 mg/dL (ref 6–20)
CALCIUM: 8.4 mg/dL — AB (ref 8.9–10.3)
CO2: 33 mmol/L — ABNORMAL HIGH (ref 22–32)
CREATININE: 0.51 mg/dL (ref 0.44–1.00)
Chloride: 101 mmol/L (ref 101–111)
GFR calc non Af Amer: >60 mL/min (ref >60)
Glucose, Bld: 154 mg/dL — ABNORMAL HIGH (ref 65–99)
Potassium: 4 mmol/L (ref 3.5–5.1)
SODIUM: 140 mmol/L (ref 135–145)

## 2016-09-15 LAB — CULTURE, BLOOD (ROUTINE X 2)
Culture: NO GROWTH
Culture: NO GROWTH

## 2016-09-15 MED ORDER — POTASSIUM CHLORIDE ER 10 MEQ PO TBCR: 10.0000 meq | EXTENDED_RELEASE_TABLET | Freq: Every day | ORAL | 0 refills | Status: AC

## 2016-09-15 MED ORDER — ADULT MULTIVITAMIN W/MINERALS CH: 1.0000 | ORAL_TABLET | Freq: Every day | ORAL | 0 refills | Status: AC

## 2016-09-15 MED ORDER — FUROSEMIDE 20 MG PO TABS: 20.0000 mg | ORAL_TABLET | Freq: Every day | ORAL | 0 refills | Status: AC

## 2016-09-15 MED ORDER — GUAIFENESIN ER 600 MG PO TB12: 600.0000 mg | ORAL_TABLET | Freq: Two times a day (BID) | ORAL | 0 refills | Status: AC

## 2016-09-15 MED ORDER — ENSURE ENLIVE PO LIQD: 237.0000 mL | Freq: Two times a day (BID) | ORAL | 2 refills | Status: AC

## 2016-09-15 MED ORDER — DILTIAZEM HCL 60 MG PO TABS: 60.0000 mg | ORAL_TABLET | Freq: Three times a day (TID) | ORAL | 0 refills | Status: AC

## 2016-09-15 MED ORDER — PREDNISONE 20 MG PO TABS: 40.0000 mg | ORAL_TABLET | Freq: Every day | ORAL | 0 refills | Status: AC

## 2016-09-15 MED ORDER — AMOXICILLIN-POT CLAVULANATE 875-125 MG PO TABS: 1.0000 | ORAL_TABLET | Freq: Two times a day (BID) | ORAL | 0 refills | Status: AC

## 2016-09-15 NOTE — Care Management Note (Signed)
Case Management Note  Patient Details  Name: Carol [name] MRN: [id number] Date of Birth: 1931/12/18  Subjective/Objective:   [facility] dme rep Jermaine aware of home 02 qualifications, & home 02 order, to deliver home 02 to rm prior d/c.[facility] rep-Susan aware of [place] orders.No further CM needs.                 Action/Plan:d/c home w/HHC/home 02.   Expected Discharge Date:                  Expected Discharge Plan:  [place]  In-House Referral:     Discharge planning Services  CM Consult  Post Acute Care Choice:  Durable Medical Equipment (rw,raised toilet seat) Choice offered to:  Patient  DME Arranged:  Oxygen DME Agency:  [place]:  RN, PT, Nurse's Aide [place] Agency:  [place]  Status of Service:  Completed, signed off  If discussed at [place] of Stay Meetings, dates discussed:    Additional Comments:  Dessa Phi, RN 09/15/2016, 11:53 AM

## 2016-09-15 NOTE — Progress Notes (Signed)
PT  Note  Patient Details Name: Carol [name] MRN: [id number] DOB: 04/11/1932   Paged to room by RN.  RN stated son in law has concerns about pt's abilitymobility to get into house (stairs).  Arrived to pt's room NT assisting pt off BSC back to bed.  Pt fatigued and remains on 4 lts to achieve 90%.  Spoke to pt re guarding possible need for ST Rehab before D/C to home.   Pt declines stating "I want to go home".  "My family is there to help me".   PT rec is for [facility] PT Will need Home O2   Rica Koyanagi  PTA [facility]  Acute  Rehab Pager      [phone]

## 2016-09-15 NOTE — Progress Notes (Signed)
Nutrition Follow-up  DOCUMENTATION CODES:   Non-severe (moderate) malnutrition in context of acute illness/injury  INTERVENTION:  - Continue Ensure Enlive BID. - Continue to encourage PO intakes of meals and supplements. - RD will continue to monitor for additional nutrition-related needs.  NUTRITION DIAGNOSIS:   Inadequate oral intake related to poor appetite as evidenced by per patient/family report. -ongoing, slightly improved.  GOAL:   Patient will meet greater than or equal to 90% of their needs -minimally met some days.  MONITOR:   PO intake, Supplement acceptance, Weight trends, Labs, I & O's   ASSESSMENT:   82 y.o. female with history of non-small cell lung cancer, esophageal stricture status post dilation, COPD, diastolic heart failure, atrial fibrillation, hypertension who came to the hospital for worsening shortness of breath and chest pain ongoing for past 1 week. Patient underwent chemotherapy and radiation for left lung mass, hard last radiation treatment was in July. Patient was diagnosed with radiation pneumonitis and was prescribed steroids, and the dose of steroid was tapered to 20 mg daily she started having symptoms of pain and shortness of breath. She denies fever, has been coughing, but unable to cough up phlegm. In the ED CT chest shows ground glass opacity near the left lower lobe mass, inflammatory versus infectious.   10/12 Per chart review, pt consumed 25% of breakfast and 35% of lunch on 10/10 and yesterday she consumed 50% of breakfast, 60% of lunch, and 55% of dinner. Talked with pt as she was eating lunch this afternoon at which time she had consumed ~1/3 of peanut butter and jelly sandwich, a few bites of sliced peaches and had drank part of a bottle of Ensure earlier in the day.  Pt states her appetite remains decreased and she is still having to force herself to eat at meal times. She is very optimistic and states that she will continue doing her best  at meal times and feels that appetite will begin to return in time. She states that foods still taste bland but she is pushing through this.   She denies nutrition-related questions or concerns at this time. No new weight since admission.  Medications reviewed; 20 mg oral Lasix/day, PRN Zofran, 40 mg oral Protonix/day, 40 mEq oral KCl TID, 40 mg oral Prednisone/day, 1 tablet Senokot BID. Labs reviewed; Ca: 8.4 mg/dL.    10/9 - No intakes documented since admission.  - Pt reports eating part of a chicken salad sandwich and cup of soup for lunch.  - Appetite has slightly increased since admission.  - Poor appetite x1-2 weeks and stated that she did not feel hungry but was forcing her self to eat.  - She denies difficulty chewing or swallowing.  - She states during radiation (which ended in July) she had pain with swallowing and that prior to esophageal dilation she had difficulty swallowing. - Infrequently she does feel that it takes longer to swallow. - Pt states since undergoing treatment she has decreased taste sensation which has not worsened in the past few weeks.  - She states that she was encouraged by oncologist PTA to drink Ensure 2-3 times/day d/t wt loss, lack of appetite. - She states that prior to starting treatment for lung cancer her UBW was 125 lbs.  - Based on review of chart, pt has lost 10 lbs (8.3% body weight) in the past 3 months which is significant for time frame.  - Mild muscle and fat wasting noted to clavicle and shoulder and upper arm; lower  body not assessed as pt was cold and covered in blankets.  - She denies any nutrition-related questions or concerns at this time.    Diet Order:  Diet Heart Room service appropriate? Yes; Fluid consistency: Thin  Skin:  Reviewed, no issues  Last BM:  10/7  Height:   Ht Readings from Last 1 Encounters:  09/10/16 5' 3"  (1.6 m)    Weight:   Wt Readings from Last 1 Encounters:  09/10/16 111 lb (50.3 kg)    Ideal  Body Weight:  52.27 kg  BMI:  Body mass index is 19.66 kg/m.  Estimated Nutritional Needs:   Kcal:  1500-1710 (30-34 kcal/kg)  Protein:  60-70 grams (1.2-1.4 grams/kg)  Fluid:  >/= 1.5 L/day  EDUCATION NEEDS:   No education needs identified at this time    Jarome Matin, MS, RD, LDN Inpatient Clinical Dietitian Pager # [phone] After hours/weekend pager # [phone]

## 2016-09-15 NOTE — Discharge Instructions (Signed)
Respiratory failure is when your lungs are not working well and your breathing (respiratory) system fails. When respiratory failure occurs, it is difficult for your lungs to get enough oxygen, get rid of carbon dioxide, or both. Respiratory failure can be life threatening.  °Respiratory failure can be acute or chronic. Acute respiratory failure is sudden, severe, and requires emergency medical treatment. Chronic respiratory failure is less severe, happens over time, and requires ongoing treatment.  °WHAT ARE THE CAUSES OF ACUTE RESPIRATORY FAILURE?  °Any problem affecting the heart or lungs can cause acute respiratory failure. Some of these causes include the following: °· Chronic bronchitis and emphysema (COPD).   °· Blood clot going to a lung (pulmonary embolism).   °· Having water in the lungs caused by heart failure, lung injury, or infection (pulmonary edema).   °· Collapsed lung (pneumothorax).   °· Pneumonia.   °· Pulmonary fibrosis.   °· Obesity.   °· Asthma.   °· Heart failure.   °· Any type of trauma to the chest that can make breathing difficult.   °· Nerve or muscle diseases making chest movements difficult. °HOW WILL MY ACUTE RESPIRATORY FAILURE BE TREATED?  °Treatment of acute respiratory failure depends on the cause of the respiratory failure. Usually, you will stay in the intensive care unit so your breathing can be watched closely. Treatment can include the following: °· Oxygen. Oxygen can be delivered through the following: °¨ Nasal cannula. This is small tubing that goes in your nose to give you oxygen. °¨ Face mask. A face mask covers your nose and mouth to give you oxygen. °· Medicine. Different medicines can be given to help with breathing. These can include: °¨ Nebulizers. Nebulizers deliver medicines to open the air passages (bronchodilators). These medicines help to open or relax the airways in the lungs so you can breathe better. They can also help loosen mucus from your  lungs. °¨ Diuretics. Diuretic medicines can help you breathe better by getting rid of extra water in your body. °¨ Steroids. Steroid medicines can help decrease swelling (inflammation) in your lungs. °¨ Antibiotics. °· Chest tube. If you have a collapsed lung (pneumothorax), a chest tube is placed to help reinflate the lung. °· Noninvasive positive pressure ventilation (NPPV). This is a tight-fitting mask that goes over your nose and mouth. The mask has tubing that is attached to a machine. The machine blows air into the tubing, which helps to keep the tiny air sacs (alveoli) in your lungs open. This machine allows you to breathe on your own. °· Ventilator. A ventilator is a breathing machine. When on a ventilator, a breathing tube is put into the lungs. A ventilator is used when you can no longer breathe well enough on your own. You may have low oxygen levels or high carbon dioxide (CO2) levels in your blood. When you are on a ventilator, sedation and pain medicines are given to make you sleep so your lungs can heal. °SEEK IMMEDIATE MEDICAL CARE IF: °· You have shortness of breath (dyspnea) with or without activity. °· You have rapid breathing (tachypnea). °· You are wheezing. °· You are unable to say more than a few words without having to catch your breath. °· You find it very difficult to function normally. °· You have a fast heart rate. °· You have a bluish color to your finger or toe nail beds. °· You have confusion or drowsiness or both. °  °This information is not intended to replace advice given to you by your health care provider. Make sure you discuss   any questions you have with your health care provider.   Document Released: 11/26/2013 Document Revised: 08/12/2015 Document Reviewed: 11/26/2013 Elsevier Interactive Patient Education 2016 [employer]  Atrial Fibrillation Atrial fibrillation is a type of heartbeat that is irregular or fast (rapid). If you have this condition, your heart keeps  quivering in a weird (chaotic) way. This condition can make it so your heart cannot pump blood normally. Having this condition gives a person more risk for stroke, heart failure, and other heart problems. There are different types of atrial fibrillation. Talk with your doctor to learn about the type that you have. HOME CARE  Take over-the-counter and prescription medicines only as told by your doctor.  If your doctor prescribed a blood-thinning medicine, take it exactly as told. Taking too much of it can cause bleeding. If you do not take enough of it, you will not have the protection that you need against stroke and other problems.  Do not use any tobacco products. These include cigarettes, chewing tobacco, and e-cigarettes. If you need help quitting, ask your doctor.  If you have apnea (obstructive sleep apnea), manage it as told by your doctor.  Do not drink alcohol.  Do not drink beverages that have caffeine. These include coffee, soda, and tea.  Maintain a healthy weight. Do not use diet pills unless your doctor says they are safe for you. Diet pills may make heart problems worse.  Follow diet instructions as told by your doctor.  Exercise regularly as told by your doctor.  Keep all follow-up visits as told by your doctor. This is important. GET HELP IF:  You notice a change in the speed, rhythm, or strength of your heartbeat.  You are taking a blood-thinning medicine and you notice more bruising.  You get tired more easily when you move or exercise. GET HELP RIGHT AWAY IF:  You have pain in your chest or your belly (abdomen).  You have sweating or weakness.  You feel sick to your stomach (nauseous).  You notice blood in your throw up (vomit), poop (stool), or pee (urine).  You are short of breath.  You suddenly have swollen feet and ankles.  You feel dizzy.  Your suddenly get weak or numb in your face, arms, or legs, especially if it happens on one side of your  body.  You have trouble talking, trouble understanding, or both.  Your face or your eyelid droops on one side. These symptoms may be an emergency. Do not wait to see if the symptoms will go away. Get medical help right away. Call your local emergency services (911 in the [place] Do not drive yourself to the hospital.   This information is not intended to replace advice given to you by your health care provider. Make sure you discuss any questions you have with your health care provider.   Document Released: 08/30/2008 Document Revised: 08/12/2015 Document Reviewed: 03/18/2015 Elsevier Interactive Patient Education 2016 [employer]. Oxygen Use at Home Oxygen can be prescribed for home use. The prescription will show the flow rate. This is how much oxygen is to be used per minute. This will be listed in liters per minute (LPM or L/M). A liter is a metric measurement of volume. You will use oxygen therapy as directed. It can be used while exercising, sleeping, or at rest. You may need oxygen continuously. Your health care provider may order a blood oxygen test (arterial blood gas or pulse oximetry test) that will show what your oxygen level  is. Your health care provider will use these measurements to learn about your needs and follow your progress. Home oxygen therapy is commonly used on patients with various lung (pulmonary) related conditions. Some of these conditions include:  Asthma.  Lung cancer.  Pneumonia.  Emphysema.  Chronic bronchitis.  Cystic fibrosis.  Other lung diseases.  Pulmonary fibrosis.  Occupational lung disease.  Heart failure.  Chronic obstructive pulmonary disease (COPD). 3 COMMON WAYS OF PROVIDING OXYGEN THERAPY  Gas: The gas form of oxygen is put into variously sized cylinders or tanks. The cylinders or oxygen tanks contain compressed oxygen. The cylinder is equipped with a regulator that controls the flow rate. Because the flow of oxygen out of the  cylinder is constant, an oxygen conserving device may be attached to the system to avoid waste. This device releases the gas only when you inhale and cuts it off when you exhale. Oxygen can be provided in a small cylinder that can be carried with you. Large tanks are heavy and are only for stationary use. After use, empty tanks must be exchanged for full tanks.  Liquid: The liquid form of oxygen is put into a container similar to a thermos. When released, the liquid converts to a gas and you breathe it in just like the compressed gas. This storage method takes up less space than the compressed gas cylinder, and you can transfer the liquid to a small, portable vessel at home. Liquid oxygen is more expensive than the compressed gas, and the vessel vents when not in use. An oxygen conserving device may be built into the vessel to conserve the oxygen. Liquid oxygen is very cold, around 297 below zero.  Oxygen concentrator: This medical device filters oxygen from room air and gives almost 100% oxygen to the patient. Oxygen concentrators are powered by electricity. Benefits of this system are:  It does not need to be resupplied.  It is not as costly as liquid oxygen.  Extra tubing permits the user to move around easier. There are several types of small, portable oxygen systems available which can help you remain active and mobile. You must have a cylinder of oxygen as a backup in the event of a power failure. Advise your electric power company that you are on oxygen therapy in order to get priority service when there is a power failure. OXYGEN DELIVERY DEVICES There are 3 common ways to deliver oxygen to your body.  Nasal cannula. This is a 2-pronged device inserted in the nostrils that is connected to tubing carrying the oxygen. The tubing can rest on the ears or be attached to the frame of eyeglasses.  Mask. People who need a high flow of oxygen generally use a mask.  Transtracheal catheter.  Transtracheal oxygen therapy requires the insertion of a small, flexible tube (catheter) in the windpipe (trachea). This catheter is held in place by a necklace. Since transtracheal oxygen bypasses the mouth, nose, and throat, a humidifier is absolutely required at flow rates of 1 LPM or greater. OXYGEN USE SAFETY TIPS  Never smoke while using oxygen. Oxygen does not burn or explode, but flammable materials will burn faster in the presence of oxygen.  Keep a Data processing manager close by. Let your fire department know that you have oxygen in your home.  Warn visitors not to smoke near you when you are using oxygen. Put up "no smoking" signs in your home where you most often use the oxygen.  When you go to a restaurant with your  portable oxygen source, ask to be seated in the nonsmoking section.  Stay at least 5 feet away from gas stoves, candles, lighted fireplaces, or other heat sources.  Do not use materials that burn easily (flammable) while using your oxygen.  If you use an oxygen cylinder, make sure it is secured to some fixed object or in a stand. If you use liquid oxygen, make sure the vessel is kept upright to keep the oxygen from pouring out. Liquid oxygen is so cold it can hurt your skin.  If you use an oxygen concentrator, call your electric company so you will be given priority service if your power goes out. Avoid using extension cords, if possible.  Regularly test your smoke detectors at home to make sure they work. If you receive care in your home from a nurse or other health care provider, he or she may also check to make sure your smoke detectors work. GUIDELINES FOR CLEANING YOUR EQUIPMENT  Wash the nasal prongs with a liquid soap. Thoroughly rinse them once or twice a week.  Replace the prongs every 2 to 4 weeks. If you have an infection (cold, pneumonia) change them when you are well.  Your health care provider will give you instructions on how to clean your transtracheal  catheter.  The humidifier bottle should be washed with soap and warm water and rinsed thoroughly between each refill. Air-dry the bottle before filling it with sterile or distilled water. The bottle and its top should be disinfected after they are cleaned.  If you use an oxygen concentrator, unplug the unit. Then wipe down the cabinet with a damp cloth and dry it daily. The air filter should be cleaned at least twice a week.  Follow your home medical equipment and service company's directions for cleaning the compressor filter. HOME CARE INSTRUCTIONS   Do not change the flow of oxygen unless directed by your health care provider.  Do not use alcohol or other sedating drugs unless instructed. They slow your breathing rate.  Do not use materials that burn easily (flammable) while using your oxygen.  Always keep a spare tank of oxygen. Plan ahead for holidays when you may not be able to get a prescription filled.  Use water-based lubricants on your lips or nostrils. Do not use an oil-based product like petroleum jelly.  To prevent your cheeks or the skin behind your ears from becoming irritated, tuck some gauze under the tubing.  If you have persistent redness under your nose, call your health care provider.  When you no longer need oxygen, your doctor will have the oxygen discontinued. Oxygen is not addicting or habit forming.  Use the oxygen as instructed. Too much oxygen can be harmful and too little will not give you the benefit you need.  Shortness of breath is not always from a lack of oxygen. If your oxygen level is not the cause of your shortness of breath, taking oxygen will not help. SEEK MEDICAL CARE IF:   You have frequent headaches.  You have shortness of breath or a lasting cough.  You have anxiety.  You are confused.  You are drowsy or sleepy all the time.  You develop an illness which aggravates your breathing.  You cannot exercise.  You are restless.  You  have blue lips or fingernails.  You have difficult or irregular breathing and it is getting worse.  You have a fever.   This information is not intended to replace advice given to you  by your health care provider. Make sure you discuss any questions you have with your health care provider.   Document Released: 02/11/2004 Document Revised: 12/12/2014 Document Reviewed: 07/03/2013 Elsevier Interactive Patient Education 2016 Else[employer] Inc.  Prednisone tablets What is this medicine? PREDNISONE (PRED ni sone) is a corticosteroid. It is commonly used to treat inflammation of the skin, joints, lungs, and other organs. Common conditions treated include asthma, allergies, and arthritis. It is also used for other conditions, such as blood disorders and diseases of the adrenal glands. This medicine may be used for other purposes; ask your health care provider or pharmacist if you have questions. What should I tell my health care provider before I take this medicine? They need to know if you have any of these conditions: -Cushing's syndrome -diabetes -glaucoma -heart disease -high blood pressure -infection (especially a virus infection such as chickenpox, cold sores, or herpes) -kidney disease -liver disease -mental illness -myasthenia gravis -osteoporosis -seizures -stomach or intestine problems -thyroid disease -an unusual or allergic reaction to lactose, prednisone, other medicines, foods, dyes, or preservatives -pregnant or trying to get pregnant -breast-feeding How should I use this medicine? Take this medicine by mouth with a glass of water. Follow the directions on the prescription label. Take this medicine with food. If you are taking this medicine once a day, take it in the morning. Do not take more medicine than you are told to take. Do not suddenly stop taking your medicine because you may develop a severe reaction. Your doctor will tell you how much medicine to take. If your doctor  wants you to stop the medicine, the dose may be slowly lowered over time to avoid any side effects. Talk to your pediatrician regarding the use of this medicine in children. Special care may be needed. Overdosage: If you think you have taken too much of this medicine contact a poison control center or emergency room at once. NOTE: This medicine is only for you. Do not share this medicine with others. What if I miss a dose? If you miss a dose, take it as soon as you can. If it is almost time for your next dose, talk to your doctor or health care professional. You may need to miss a dose or take an extra dose. Do not take double or extra doses without advice. What may interact with this medicine? Do not take this medicine with any of the following medications: -metyrapone -mifepristone This medicine may also interact with the following medications: -aminoglutethimide -amphotericin B -aspirin and aspirin-like medicines -barbiturates -certain medicines for diabetes, like glipizide or glyburide -cholestyramine -cholinesterase inhibitors -cyclosporine -digoxin -diuretics -ephedrine -female hormones, like estrogens and birth control pills -isoniazid -ketoconazole -NSAIDS, medicines for pain and inflammation, like ibuprofen or naproxen -phenytoin -rifampin -toxoids -vaccines -warfarin This list may not describe all possible interactions. Give your health care provider a list of all the medicines, herbs, non-prescription drugs, or dietary supplements you use. Also tell them if you smoke, drink alcohol, or use illegal drugs. Some items may interact with your medicine. What should I watch for while using this medicine? Visit your doctor or health care professional for regular checks on your progress. If you are taking this medicine over a prolonged period, carry an identification card with your name and address, the type and dose of your medicine, and your doctor's name and address. This  medicine may increase your risk of getting an infection. Tell your doctor or health care professional if you are around anyone with  measles or chickenpox, or if you develop sores or blisters that do not heal properly. If you are going to have surgery, tell your doctor or health care professional that you have taken this medicine within the last twelve months. Ask your doctor or health care professional about your diet. You may need to lower the amount of salt you eat. This medicine may affect blood sugar levels. If you have diabetes, check with your doctor or health care professional before you change your diet or the dose of your diabetic medicine. What side effects may I notice from receiving this medicine? Side effects that you should report to your doctor or health care professional as soon as possible: -allergic reactions like skin rash, itching or hives, swelling of the face, lips, or tongue -changes in emotions or moods -changes in vision -depressed mood -eye pain -fever or chills, cough, sore throat, pain or difficulty passing urine -increased thirst -swelling of ankles, feet Side effects that usually do not require medical attention (report to your doctor or health care professional if they continue or are bothersome): -confusion, excitement, restlessness -headache -nausea, vomiting -skin problems, acne, thin and shiny skin -trouble sleeping -weight gain This list may not describe all possible side effects. Call your doctor for medical advice about side effects. You may report side effects to FDA at 1-800-FDA-1088. Where should I keep my medicine? Keep out of the reach of children. Store at room temperature between 15 and 30 degrees C (59 and 86 degrees F). Protect from light. Keep container tightly closed. Throw away any unused medicine after the expiration date. NOTE: This sheet is a summary. It may not cover all possible information. If you have questions about this medicine,  talk to your doctor, pharmacist, or health care provider.    2016, [employer]. (2011-07-07 10:57:14)   Fall Prevention in the [place] can cause injuries. They can happen to people of all ages. There are many things you can do to make your home safe and to help prevent falls.  WHAT CAN I DO ON THE OUTSIDE OF MY HOME?  Regularly fix the edges of walkways and driveways and fix any cracks.  Remove anything that might make you trip as you walk through a door, such as a raised step or threshold.  Trim any bushes or trees on the path to your home.  Use bright outdoor lighting.  Clear any walking paths of anything that might make someone trip, such as rocks or tools.  Regularly check to see if handrails are loose or broken. Make sure that both sides of any steps have handrails.  Any raised decks and porches should have guardrails on the edges.  Have any leaves, snow, or ice cleared regularly.  Use sand or salt on walking paths during winter.  Clean up any spills in your garage right away. This includes oil or grease spills. WHAT CAN I DO IN THE BATHROOM?   Use night lights.  Install grab bars by the toilet and in the tub and shower. Do not use towel bars as grab bars.  Use non-skid mats or decals in the tub or shower.  If you need to sit down in the shower, use a plastic, non-slip stool.  Keep the floor dry. Clean up any water that spills on the floor as soon as it happens.  Remove soap buildup in the tub or shower regularly.  Attach bath mats securely with double-sided non-slip rug tape.  Do not have throw rugs  and other things on the floor that can make you trip. WHAT CAN I DO IN THE BEDROOM?  Use night lights.  Make sure that you have a light by your bed that is easy to reach.  Do not use any sheets or blankets that are too big for your bed. They should not hang down onto the floor.  Have a firm chair that has side arms. You can use this for support  while you get dressed.  Do not have throw rugs and other things on the floor that can make you trip. WHAT CAN I DO IN THE KITCHEN?  Clean up any spills right away.  Avoid walking on wet floors.  Keep items that you use a lot in easy-to-reach places.  If you need to reach something above you, use a strong step stool that has a grab bar.  Keep electrical cords out of the way.  Do not use floor polish or wax that makes floors slippery. If you must use wax, use non-skid floor wax.  Do not have throw rugs and other things on the floor that can make you trip. WHAT CAN I DO WITH MY STAIRS?  Do not leave any items on the stairs.  Make sure that there are handrails on both sides of the stairs and use them. Fix handrails that are broken or loose. Make sure that handrails are as long as the stairways.  Check any carpeting to make sure that it is firmly attached to the stairs. Fix any carpet that is loose or worn.  Avoid having throw rugs at the top or bottom of the stairs. If you do have throw rugs, attach them to the floor with carpet tape.  Make sure that you have a light switch at the top of the stairs and the bottom of the stairs. If you do not have them, ask someone to add them for you. WHAT ELSE CAN I DO TO HELP PREVENT FALLS?  Wear shoes that:  Do not have high heels.  Have rubber bottoms.  Are comfortable and fit you well.  Are closed at the toe. Do not wear sandals.  If you use a stepladder:  Make sure that it is fully opened. Do not climb a closed stepladder.  Make sure that both sides of the stepladder are locked into place.  Ask someone to hold it for you, if possible.  Clearly mark and make sure that you can see:  Any grab bars or handrails.  First and last steps.  Where the edge of each step is.  Use tools that help you move around (mobility aids) if they are needed. These include:  Canes.  Walkers.  Scooters.  Crutches.  Turn on the lights when  you go into a dark area. Replace any light bulbs as soon as they burn out.  Set up your furniture so you have a clear path. Avoid moving your furniture around.  If any of your floors are uneven, fix them.  If there are any pets around you, be aware of where they are.  Review your medicines with your doctor. Some medicines can make you feel dizzy. This can increase your chance of falling. Ask your doctor what other things that you can do to help prevent falls.   This information is not intended to replace advice given to you by your health care provider. Make sure you discuss any questions you have with your health care provider.   Document Released: 09/17/2009 Document Revised: 04/07/2015  Document Reviewed: 12/26/2014 Elsevier Interactive Patient Education [employer].

## 2016-09-15 NOTE — Discharge Summary (Signed)
Physician Discharge Summary  Jahara Dail OHY:[id number] DOB: 1932/06/10 DOA: 09/10/2016  PCP: [name], DO  Admit date: 09/10/2016 Discharge date: 09/15/2016  Admitted From: Home Disposition: Home with HHPT  Recommendations for Outpatient Follow-up:  1. Follow up with PCP in 1 weeks 2. Follow up with pulmonologist office as already scheduled 3. Follow up with oncologist as scheduled  Discharge Condition: Medically Stabilized CODE STATUS: DNR  Brief/Interim Summary: Interim summary and HPI 82 y.o.female,With history of non-small cell lung cancer, esophageal stricture status post dilation, COPD, diastolic heart failure, atrial fibrillation, hypertension who came to the hospital for worsening shortness of breath and chest pain ongoing for past 1 week.Patient underwent chemotherapy and radiation for left lung mass, hard last radiation treatment was in July.Patient was diagnosed with radiation pneumonitis and was prescribed steroids, and the dose of steroid was tapered to 20 mg daily she started having symptoms of pain and shortness of breath. She denies fever, has been coughing,but unable to cough up phlegm.  In the ED CT chest shows ground glass opacity near the left lower lobe mass,inflammatory versus infectious.Patient empirically started on vancomycin and Zosyn for possible healthcare associated pneumonia.Also found to be in A. fib with RVR, uncontrolled hypertension.  HOSPITAL COURSE / ASSESSMENT AND PLAN: Acute hypoxic respiratory failure-secondary to COPD exacerbation and Pneumonia. Clinically much improved.  Will continue mucinex 1 tablet by mouth twice a day.  Continue antibiotics as described below, now transitioned to PO regimen.  Pt to be discharged home on prednisone 40 mg daily until she follow up with pulmonology office as scheduled Tammy Parrett on Wednesday, October 18 at 10 am.  She then has follow up with Dr. Ashok Cordia on 10/25/16. 1. Healthcare associated  pneumonia versus radiation pneumonitis- CT chest showed groundglass opacity near the left lower lobe,radiation pneumonitis versus infectious etiology considered. Will transition to antibiotics oral regimen (Augmentin) and will follow culture data.  Home on augmentin and prednisone.  Resume home respiratory medications.   2. Atrial fibrillation with RVR-will continue cardizem PO; HR much better controlled, but still with episodes of HR in 115's-120's. Will discharge on diltiazem '60mg'$  TID.  Continue ASA, no anticoagulation candidate given age and fall risk. CHADsVASC score 3  3. Hypertension- stable overall. Continue on Cardizem, will monitor and adjust as needed. Resume home amlodipine at discharge.  4. Non-small cell lung cancer of left lung-patient is status post chemotherapy, radiation treatment.will continue follow up by oncology as outpatient.  Pt will be discharged with HHPT.  Pt declined SNF placement after several PT evaluations.  5. History of esophageal stricture-status post dilation, no odynophagia at this time. Continue PPI. 6. Moderate protein calorie malnutrition: nutrition service on board, will follow rec's on feeding supplements. 7. Hypokalemia: replete and WNL currently.   Code Status: DNR Family Communication: family at bedside updated Disposition Plan: Home with HHPT (Pt declined SNF placement)  Consultants:  None   Procedures:  See below for x-ray reports    Discharge Diagnoses:  Active Problems:   HTN (hypertension)   Hypoxia   Dysphagia   History of esophageal stricture   Mass of lower lobe of left lung   Non-small cell cancer of left lung (HCC)   Radiation pneumonitis (HCC)   COPD exacerbation (HCC)   Malnutrition of moderate degree   HCAP (healthcare-associated pneumonia)  Discharge Instructions    Medication List    STOP taking these medications   HYDROcodone-acetaminophen 7.5-325 mg/15 ml solution Commonly known as:  HYCET   oxycodone 5 MG  capsule Commonly known as:  OXY-IR     TAKE these medications   albuterol 108 (90 Base) MCG/ACT inhaler Commonly known as:  PROVENTIL HFA Inhale 2 puffs into the lungs 2 (two) times daily as needed for shortness of breath.   amLODipine 5 MG tablet Commonly known as:  NORVASC Take 1 tablet (5 mg total) by mouth daily. What changed:  when to take this   amoxicillin-clavulanate 875-125 MG tablet Commonly known as:  AUGMENTIN Take 1 tablet by mouth every 12 (twelve) hours.   aspirin EC 81 MG tablet Take 81 mg by mouth every morning.   atorvastatin 10 MG tablet Commonly known as:  LIPITOR Take 1 tablet (10 mg total) by mouth daily at 6 PM. Reported on 01/29/2016   budesonide-formoterol 160-4.5 MCG/ACT inhaler Commonly known as:  SYMBICORT Inhale 2 puffs into the lungs 2 (two) times daily.   diltiazem 60 MG tablet Commonly known as:  CARDIZEM Take 1 tablet (60 mg total) by mouth 3 (three) times daily.   EX-LAX 15 MG Tabs Generic drug:  Sennosides Take 15 mg by mouth daily as needed (For constipation.).   feeding supplement (ENSURE ENLIVE) Liqd Take 237 mLs by mouth 2 (two) times daily between meals.   furosemide 20 MG tablet Commonly known as:  LASIX Take 1 tablet (20 mg total) by mouth daily. Start taking on:  09/18/2016   guaiFENesin 600 MG 12 hr tablet Commonly known as:  MUCINEX Take 1 tablet (600 mg total) by mouth 2 (two) times daily.   multivitamin with minerals Tabs tablet Take 1 tablet by mouth daily. Start taking on:  09/21/2016   omeprazole 40 MG capsule Commonly known as:  PRILOSEC Take 1 capsule (40 mg total) by mouth 2 (two) times daily.   potassium chloride 10 MEQ tablet Commonly known as:  K-DUR Take 1 tablet (10 mEq total) by mouth daily.   predniSONE 20 MG tablet Commonly known as:  DELTASONE Take 2 tablets (40 mg total) by mouth daily with breakfast. What changed:  medication strength   Tiotropium Bromide Monohydrate 2.5 MCG/ACT  Aers Commonly known as:  SPIRIVA RESPIMAT Inhale 2 puffs into the lungs daily. What changed:  when to take this      [place] .   Why:  [place], physical therapy,aide Contact information: [address] [place] [address] [phone]        Rexene Edison, NP Follow up on 09/21/2016.   Specialty:  Pulmonary Disease Why:  Follow up with lung doctors at 10 am. Contact information: [address] [place] [address] [phone]        [facility] .   Why:  home oxygen Contact information: [place] [address] [phone]        Renee Kuneff, DO. Schedule an appointment as soon as possible for a visit in 2 week(s).   Specialty:  Family Medicine Contact information: [address] [place] [address] [phone]          No Known Allergies  Procedures/Studies: Dg Chest 2 View  Result Date: 09/15/2016 CLINICAL DATA:  Shortness of breath and weakness today. History of lung cancer. EXAM: CHEST  2 VIEW COMPARISON:  Chest x-ray 09/10/2016 FINDINGS: The cardiac silhouette, mediastinal and hilar contours are within normal limits and stable. Stable tortuosity and calcification of the thoracic aorta. Stable dense scarring changes in the left mid lung. Stable appearing  radiation changes in the left lung. No definite airspace consolidation or pleural effusion. The bony thorax is stable. IMPRESSION: Stable scarring and radiation changes in the left lung. No definite infiltrates or effusions. Electronically Signed   By: Marijo Sanes M.D.   On: 09/15/2016 09:40   Dg Chest 2 View  Result Date: 09/10/2016 CLINICAL DATA:  History of lung carcinoma. Chest pain, cough and congestion for 4 weeks. EXAM: CHEST  2 VIEW COMPARISON:  09/07/2016 FINDINGS: Left suprahilar opacity, with linear opacity extending to the lateral pleural margin, is stable consistent with lung cancer  treatment related scarring. Lungs are hyperexpanded but otherwise clear. No pleural effusion or pneumothorax. Cardiac silhouette is top-normal in size. No mediastinal or right hilar masses. No convincing adenopathy. Skeletal structures are demineralized. There is mild depression of the upper endplate of an upper thoracic vertebra, stable. IMPRESSION: 1. No acute cardiopulmonary disease. No change from the prior study. Electronically Signed   By: Lajean Manes M.D.   On: 09/10/2016 14:55   Dg Chest 2 View  Result Date: 09/07/2016 CLINICAL DATA:  Chest pain not responsive to prednisone. History of radiation therapy 2 months ago for known lung malignancy. History of COPD, coronary artery disease, former smoker. EXAM: CHEST  2 VIEW COMPARISON:  CT scan of the chest dated August 03, 2016 FINDINGS: The lungs are hyperinflated. The interstitial markings are coarse. There is stable linear density in the left mid lung extending from the anterior to posterior surfaces of the long. No discrete mass is observed. A previously demonstrated partially cavitary mass in the superior segment of the left lower lobe is not clearly evident. The heart and pulmonary vascularity are normal. There is calcification in the wall of the thoracic aorta. There is no pleural effusion. There is new partial compression of the body of T5 with loss of height of approximately 15-20% anteriorly and 10-15% posteriorly. IMPRESSION: 1. Chronic changes within the left lung without discrete mass. No pneumothorax or left pleural effusion. 2. COPD. 3. Aortic atherosclerosis. 4. New partial compression of the body of T5 since the August 30th CT scan. Electronically Signed   By: David  Martinique M.D.   On: 09/07/2016 14:47   Ct Angio Chest Pe W And/or Wo Contrast  Result Date: 09/10/2016 CLINICAL DATA:  patient has had increased shortness of breath/chest pain/weakness over the last 3 weeks. 84% oxygen on room air. Hx of lung cancer. Last chemo/radiation was  6-7 weeks ago. EXAM: CT ANGIOGRAPHY CHEST WITH CONTRAST TECHNIQUE: Multidetector CT imaging of the chest was performed using the standard protocol during bolus administration of intravenous contrast. Multiplanar CT image reconstructions and MIPs were obtained to evaluate the vascular anatomy. CONTRAST:  100 mL of Isovue 370 intravenous contrast COMPARISON:  Current chest radiograph.  Chest CTA, 08/03/2016. FINDINGS: Cardiovascular: Study is suboptimal for evaluation of pulmonary embolus. There is less enhancement of the pulmonary arteries in of the aorta. Allowing for the above limitation, there is no convincing pulmonary embolus. The heart is normal in size and configuration. There is a stable intra atrial lipoma. There are dense coronary artery calcifications. Dense atherosclerotic calcifications are noted along the aortic arch and the arch branch vessels. There is a moderate to severe stenosis of the left common carotid artery at its origin. There is a severe stenosis versus occlusion of the left subclavian artery. The aorta is ectatic. No aneurysm. No dissection. Mediastinum/Nodes: No mediastinal or hilar masses or adenopathy. Trachea and esophagus are unremarkable. Lungs/Pleura: Focal opacity in the superior  segment left lower lobe is without significant change from the prior CT. There is a band of opacity at the level of the superior left hilum extends across the left upper lobe to the left lower lobe superior segment. This is consistent with radiation treatment related scarring. There is also similar to the prior CT. There is additional ground-glass and coarse reticular type opacity in the left lower lobe, below the superior segment. This is new. It may reflect new scarring or pneumonia/ pneumonitis. Elsewhere, the lungs show stable areas of peripheral interstitial thickening. Changes of emphysema are stable. No pulmonary edema. No pleural effusion or pneumothorax. Upper Abdomen: No acute abnormality.  Musculoskeletal: No osteoblastic or osteolytic lesions. Review of the MIP images confirms the above findings. IMPRESSION: 1. Study is suboptimal for the evaluation of pulmonary emboli with lower enhancement in the pulmonary arteries than in the aorta. Allowing for this, there is no evidence of a pulmonary embolus. 2. New area of ground-glass and reticular type opacity in the mid aspect of the left lower lobe. This may reflect infection or post radiation induced inflammation. No other evidence of an acute abnormality within the chest. 3. Focal opacity in the superior segment left lower lobe with a bandlike area of opacity across the left upper lobe and superior segment left lower lobe is stable reflecting treatment related scarring. 4. Significant atherosclerotic disease of the aorta and branch vessels and significant coronary artery calcifications. Electronically Signed   By: Lajean Manes M.D.   On: 09/10/2016 16:04    Subjective: Pt says she feels much better and wants to go home.  Pt declines SNF placement.  Discharge Exam: Vitals:   09/15/16 0531 09/15/16 1325  BP: (!) 169/82 (!) 163/79  Pulse: 89 92  Resp: (!) 22 20  Temp: 97.9 F (36.6 C) 97.4 F (36.3 C)   Vitals:   09/14/16 2144 09/15/16 0531 09/15/16 0900 09/15/16 1325  BP: (!) 176/79 (!) 169/82  (!) 163/79  Pulse: (!) 51 89  92  Resp: 16 (!) 22  20  Temp: 97.6 F (36.4 C) 97.9 F (36.6 C)  97.4 F (36.3 C)  TempSrc: Oral Oral  Oral  SpO2: 97% 94% 94% 94%  Weight:      Height:        ? General:  Frail and chronically ill in appearance.  ? Cardiovascular: irregular, rate controlled, no rubs, no gallops, no JVD ? Respiratory: No wheezing.  No using accessory muscles currently. ? Abdomen: soft, NT, ND, positive BS ? Musculoskeletal: no cyanosis, no clubbing   The results of significant diagnostics from this hospitalization (including imaging, microbiology, ancillary and laboratory) are listed below for reference.      Microbiology: Recent Results (from the past 240 hour(s))  Blood culture (routine x 2)     Status: None   Collection Time: 09/10/16  1:48 PM  Result Value Ref Range Status   Specimen Description BLOOD RIGHT ANTECUBITAL  Final   Special Requests BOTTLES DRAWN AEROBIC AND ANAEROBIC 5CC  Final   Culture   Final    NO GROWTH 5 DAYS Performed at [facility]    Report Status 09/15/2016 FINAL  Final  Blood culture (routine x 2)     Status: None   Collection Time: 09/10/16  4:13 PM  Result Value Ref Range Status   Specimen Description BLOOD RIGHT ANTECUBITAL  Final   Special Requests BOTTLES DRAWN AEROBIC AND ANAEROBIC 5CC  Final   Culture   Final  NO GROWTH 5 DAYS Performed at [facility]    Report Status 09/15/2016 FINAL  Final  MRSA PCR Screening     Status: None   Collection Time: 09/10/16  6:45 PM  Result Value Ref Range Status   MRSA by PCR NEGATIVE NEGATIVE Final    Comment:        The GeneXpert MRSA Assay (FDA approved for NASAL specimens only), is one component of a comprehensive MRSA colonization surveillance program. It is not intended to diagnose MRSA infection nor to guide or monitor treatment for MRSA infections.      Labs: BNP (last 3 results) No results for input(s): BNP in the last 8760 hours. Basic Metabolic Panel:  Recent Labs Lab 09/11/16 0330 09/12/16 0321 09/13/16 0511 09/14/16 0524 09/15/16 0518  NA 139 138 141 141 140  K 3.7 3.2* 4.1 2.8* 4.0  CL 103 101 104 98* 101  CO2 '25 27 28 '$ 33* 33*  GLUCOSE 172* 171* 139* 153* 154*  BUN '16 19 17 20 19  '$ CREATININE 0.58 0.66 0.68 0.65 0.51  CALCIUM 8.1* 8.3* 8.4* 8.4* 8.4*   Liver Function Tests:  Recent Labs Lab 09/11/16 0330  AST 30  ALT 37  ALKPHOS 100  BILITOT 0.6  PROT 6.8  ALBUMIN 3.0*   No results for input(s): LIPASE, AMYLASE in the last 168 hours. No results for input(s): AMMONIA in the last 168 hours. CBC:  Recent Labs Lab 09/10/16 1349 09/11/16 0330  09/14/16 0524  WBC 5.9 5.3 6.1  HGB 13.0 12.2 10.7*  HCT 39.2 38.1 33.6*  MCV 87.3 89.6 90.1  PLT 231 207 217   Cardiac Enzymes: No results for input(s): CKTOTAL, CKMB, CKMBINDEX, TROPONINI in the last 168 hours. BNP: Invalid input(s): POCBNP CBG: No results for input(s): GLUCAP in the last 168 hours. D-Dimer No results for input(s): DDIMER in the last 72 hours. Hgb A1c No results for input(s): HGBA1C in the last 72 hours. Lipid Profile No results for input(s): CHOL, HDL, LDLCALC, TRIG, CHOLHDL, LDLDIRECT in the last 72 hours. Thyroid function studies No results for input(s): TSH, T4TOTAL, T3FREE, THYROIDAB in the last 72 hours.  Invalid input(s): FREET3 Anemia work up No results for input(s): VITAMINB12, FOLATE, FERRITIN, TIBC, IRON, RETICCTPCT in the last 72 hours. Urinalysis    Component Value Date/Time   COLORURINE AMBER (A) 01/29/2016 2052   APPEARANCEUR CLOUDY (A) 01/29/2016 2052   LABSPEC 1.046 (H) 01/29/2016 2052   PHURINE 5.0 01/29/2016 2052   GLUCOSEU NEGATIVE 01/29/2016 2052   HGBUR NEGATIVE 01/29/2016 2052   BILIRUBINUR MODERATE (A) 01/29/2016 2052   KETONESUR 15 (A) 01/29/2016 2052   PROTEINUR 30 (A) 01/29/2016 2052   NITRITE NEGATIVE 01/29/2016 2052   LEUKOCYTESUR TRACE (A) 01/29/2016 2052   Sepsis Labs Invalid input(s): PROCALCITONIN,  WBC,  LACTICIDVEN Microbiology Recent Results (from the past 240 hour(s))  Blood culture (routine x 2)     Status: None   Collection Time: 09/10/16  1:48 PM  Result Value Ref Range Status   Specimen Description BLOOD RIGHT ANTECUBITAL  Final   Special Requests BOTTLES DRAWN AEROBIC AND ANAEROBIC 5CC  Final   Culture   Final    NO GROWTH 5 DAYS Performed at [facility]    Report Status 09/15/2016 FINAL  Final  Blood culture (routine x 2)     Status: None   Collection Time: 09/10/16  4:13 PM  Result Value Ref Range Status   Specimen Description BLOOD RIGHT ANTECUBITAL  Final  Special Requests BOTTLES  DRAWN AEROBIC AND ANAEROBIC 5CC  Final   Culture   Final    NO GROWTH 5 DAYS Performed at [facility]    Report Status 09/15/2016 FINAL  Final  MRSA PCR Screening     Status: None   Collection Time: 09/10/16  6:45 PM  Result Value Ref Range Status   MRSA by PCR NEGATIVE NEGATIVE Final    Comment:        The GeneXpert MRSA Assay (FDA approved for NASAL specimens only), is one component of a comprehensive MRSA colonization surveillance program. It is not intended to diagnose MRSA infection nor to guide or monitor treatment for MRSA infections.    Time coordinating discharge: 34 minutes  SIGNED:  Irwin Brakeman, MD  [facility] 09/15/2016, 2:14 PM Pager   If 7PM-7AM, please contact night-coverage [email or web address] Password [email or web address]

## 2016-09-15 NOTE — Progress Notes (Signed)
PULMONARY / CRITICAL CARE MEDICINE   Name: Carol [name] MRN: [id number] DOB: 1932-03-19    ADMISSION DATE:  09/10/2016 CONSULTATION DATE:  09/13/2016  REFERRING MD:  Dr. Dyann Kief, [facility]  CHIEF COMPLAINT:  Short of breath  HISTORY OF PRESENT ILLNESS:   82 yo female former smoker presented with dyspnea, chest pain, and weakness for about 3 weeks.  She was also noted to have SpO2 84% on RA.  She has hx of NSCLC lung cancer and had last XRT/chemo in July 2017.  She was dx'ed with radiation pneumonitis and started on prednisone >> her symptoms progressed once her prednisone dose was decreased to 20 mg.  There was concern for HCAP and she was started on antibiotics.  She is followed by Dr. Ashok Cordia in pulmonary office for COPD.  She was noted to have A fib with RVR and started on cardizem.  SUBJECTIVE:  Cough better.  Walked in hall some.  Denies chest pain.  VITAL SIGNS: BP (!) 169/82 (BP Location: Right Arm)   Pulse 89   Temp 97.9 F (36.6 C) (Oral)   Resp (!) 22   Ht '5\' 3"'$  (1.6 m)   Wt 111 lb (50.3 kg)   SpO2 94%   BMI 19.66 kg/m   INTAKE / OUTPUT: I/O last 3 completed shifts: In: 540 [P.O.:540] Out: 2250 [Urine:2250]  PHYSICAL EXAMINATION: General:  Alert, pleasant Neuro:  Normal strength HEENT:  No sinus tenderness, no stridor, no LAN Cardiovascular:  Regular, no murmur Lungs:  Decreased BS, no wheeze Abdomen:  Soft, non tender Musculoskeletal:  Decreased muscle bulk, no edema Skin:  No rashes  LABS: CMP Latest Ref Rng & Units 09/15/2016 09/14/2016 09/13/2016  Glucose 65 - 99 mg/dL 154(H) 153(H) 139(H)  BUN 6 - 20 mg/dL '19 20 17  '$ Creatinine 0.44 - 1.00 mg/dL 0.51 0.65 0.68  Sodium 135 - 145 mmol/L 140 141 141  Potassium 3.5 - 5.1 mmol/L 4.0 2.8(L) 4.1  Chloride 101 - 111 mmol/L 101 98(L) 104  CO2 22 - 32 mmol/L 33(H) 33(H) 28  Calcium 8.9 - 10.3 mg/dL 8.4(L) 8.4(L) 8.4(L)  Total Protein 6.5 - 8.1 g/dL - - -  Total Bilirubin 0.3 - 1.2 mg/dL - - -  Alkaline Phos 38 -  126 U/L - - -  AST 15 - 41 U/L - - -  ALT 14 - 54 U/L - - -    CBC Latest Ref Rng & Units 09/14/2016 09/11/2016 09/10/2016  WBC 4.0 - 10.5 K/uL 6.1 5.3 5.9  Hemoglobin 12.0 - 15.0 g/dL 10.7(L) 12.2 13.0  Hematocrit 36.0 - 46.0 % 33.6(L) 38.1 39.2  Platelets 150 - 400 K/uL 217 207 231    Dg Chest 2 View  Result Date: 09/15/2016 CLINICAL DATA:  Shortness of breath and weakness today. History of lung cancer. EXAM: CHEST  2 VIEW COMPARISON:  Chest x-ray 09/10/2016 FINDINGS: The cardiac silhouette, mediastinal and hilar contours are within normal limits and stable. Stable tortuosity and calcification of the thoracic aorta. Stable dense scarring changes in the left mid lung. Stable appearing radiation changes in the left lung. No definite airspace consolidation or pleural effusion. The bony thorax is stable. IMPRESSION: Stable scarring and radiation changes in the left lung. No definite infiltrates or effusions. Electronically Signed   By: Marijo Sanes M.D.   On: 09/15/2016 09:40    STUDIES:  Echo 01/30/16 >> EF 65 to 78%, grade 1 diastolic CHF, mild MR PFT 03/11/16 >> FEV1 0.95 (50%), FEV1% 50, TLC 4.96 (98%),  DLCO 23% Needle Bx LLL mass 04/08/16 >> NSCLC (Squamous cell) CT angio chest 09/10/16 >> XRT changes Lt hilum to LUL and LLL, new GGO LLL, centrilobular emphysema  CULTURES: Blood 10/07 >>  ANTIBIOTICS: Vancomycin 10/07 >> 10/10 Zosyn 10/07 >> 10/10 Augmentin 10/10 >>   ASSESSMENT / PLAN:  Acute hypoxic respiratory failure 2nd to HCAP, radiation pneumonitis, and AECOPD. - oxygen to keep SpO2 90 to 95% >> will need to arrange for home oxygen set up - transition back to symbicort and prn albuterol - using prednisone for AECOPD and radiation pneumonitis >> keep on 40 mg daily until outpt pulmonary follow up - day 6 of Abx per primary team  A fib with RVR. - per primary team  Hx of Stage IIIA (T2b, N2, M0) NSCLC - Squamous cell. - last chemo 06/20/16 >> carboplatin, paclitaxel -  last XRT 06/24/16 - f/u with oncology, radiation oncology as outpt  Goals of care. - DNR/DNI  Okay for d/c home from pulmonary stand point once home oxygen set up arranged.  She is scheduled for outpt pulmonary follow up with Rexene Edison on Wednesday, October 18 at 10 am.  She then has follow up with Dr. Ashok Cordia on 10/25/16.   Chesley Mires, MD [facility] Pulmonary/Critical Care 09/15/2016, 9:45 AM Pager:  [phone] After 3pm call: [phone]

## 2016-09-15 NOTE — Care Management Note (Signed)
Case Management Note  Patient Details  Name: Carol [name] MRN: [id number] Date of Birth: 1932-09-08  Subjective/Objective:   Received call for home dme 3n1. TC dme rep-Jermaine-will deliver home 3n1 to patient's home tomorrow along w/home 02 concentrator set up. Secy notified. Action/Plan: d/c home w/HHC/DME   Expected Discharge Date:                  Expected Discharge Plan:  [place]  In-House Referral:     Discharge planning Services  CM Consult  Post Acute Care Choice:  Durable Medical Equipment (rw,raised toilet seat) Choice offered to:  Patient  DME Arranged:  Oxygen, 3-N-1 DME Agency:  [place]:  RN, PT, Nurse's Aide [place] Agency:  [place]  Status of Service:  Completed, signed off  If discussed at [place] of Stay Meetings, dates discussed:    Additional Comments:  Dessa Phi, RN 09/15/2016, 5:59 PM

## 2016-09-15 NOTE — Progress Notes (Signed)
Pt was at baseline at discharge. A&Ox4, ambulatory with assist. Pt is weak. Discharge instructions reviewed with son in law and pt. Questions concerns denied. HH obtained through [facility]. Oxygen tank x2 in room , BSC delivered.  [facility] to deliver the other tank this evening. Marland Kitchen

## 2016-09-16 ENCOUNTER — Telehealth: Payer: Self-pay | Admitting: Pulmonary Disease

## 2016-09-16 ENCOUNTER — Encounter (HOSPITAL_COMMUNITY): Payer: Self-pay | Admitting: Emergency Medicine

## 2016-09-16 ENCOUNTER — Emergency Department (HOSPITAL_COMMUNITY): Payer: Medicare Other

## 2016-09-16 ENCOUNTER — Inpatient Hospital Stay (HOSPITAL_COMMUNITY)
Admission: EM | Admit: 2016-09-16 | Discharge: 2016-10-05 | DRG: 205 | Disposition: E | Payer: Medicare Other | Attending: Family Medicine | Admitting: Family Medicine

## 2016-09-16 DIAGNOSIS — J9811 Atelectasis: Secondary | ICD-10-CM | POA: Diagnosis present

## 2016-09-16 DIAGNOSIS — R0602 Shortness of breath: Secondary | ICD-10-CM | POA: Diagnosis not present

## 2016-09-16 DIAGNOSIS — I714 Abdominal aortic aneurysm, without rupture: Secondary | ICD-10-CM | POA: Diagnosis present

## 2016-09-16 DIAGNOSIS — R0789 Other chest pain: Secondary | ICD-10-CM

## 2016-09-16 DIAGNOSIS — J700 Acute pulmonary manifestations due to radiation: Principal | ICD-10-CM | POA: Diagnosis present

## 2016-09-16 DIAGNOSIS — D72829 Elevated white blood cell count, unspecified: Secondary | ICD-10-CM | POA: Diagnosis present

## 2016-09-16 DIAGNOSIS — Z7189 Other specified counseling: Secondary | ICD-10-CM

## 2016-09-16 DIAGNOSIS — C3492 Malignant neoplasm of unspecified part of left bronchus or lung: Secondary | ICD-10-CM | POA: Diagnosis present

## 2016-09-16 DIAGNOSIS — Z87891 Personal history of nicotine dependence: Secondary | ICD-10-CM

## 2016-09-16 DIAGNOSIS — J441 Chronic obstructive pulmonary disease with (acute) exacerbation: Secondary | ICD-10-CM | POA: Diagnosis present

## 2016-09-16 DIAGNOSIS — I739 Peripheral vascular disease, unspecified: Secondary | ICD-10-CM | POA: Diagnosis present

## 2016-09-16 DIAGNOSIS — Z66 Do not resuscitate: Secondary | ICD-10-CM | POA: Diagnosis present

## 2016-09-16 DIAGNOSIS — Z7952 Long term (current) use of systemic steroids: Secondary | ICD-10-CM

## 2016-09-16 DIAGNOSIS — Z8249 Family history of ischemic heart disease and other diseases of the circulatory system: Secondary | ICD-10-CM

## 2016-09-16 DIAGNOSIS — F411 Generalized anxiety disorder: Secondary | ICD-10-CM

## 2016-09-16 DIAGNOSIS — Z7951 Long term (current) use of inhaled steroids: Secondary | ICD-10-CM

## 2016-09-16 DIAGNOSIS — E873 Alkalosis: Secondary | ICD-10-CM | POA: Diagnosis present

## 2016-09-16 DIAGNOSIS — R5381 Other malaise: Secondary | ICD-10-CM | POA: Diagnosis present

## 2016-09-16 DIAGNOSIS — E860 Dehydration: Secondary | ICD-10-CM | POA: Diagnosis present

## 2016-09-16 DIAGNOSIS — Z79899 Other long term (current) drug therapy: Secondary | ICD-10-CM

## 2016-09-16 DIAGNOSIS — I10 Essential (primary) hypertension: Secondary | ICD-10-CM | POA: Diagnosis present

## 2016-09-16 DIAGNOSIS — R1312 Dysphagia, oropharyngeal phase: Secondary | ICD-10-CM

## 2016-09-16 DIAGNOSIS — R0902 Hypoxemia: Secondary | ICD-10-CM | POA: Diagnosis present

## 2016-09-16 DIAGNOSIS — Z803 Family history of malignant neoplasm of breast: Secondary | ICD-10-CM

## 2016-09-16 DIAGNOSIS — I482 Chronic atrial fibrillation: Secondary | ICD-10-CM | POA: Diagnosis present

## 2016-09-16 DIAGNOSIS — J9621 Acute and chronic respiratory failure with hypoxia: Secondary | ICD-10-CM | POA: Diagnosis not present

## 2016-09-16 DIAGNOSIS — Z681 Body mass index (BMI) 19 or less, adult: Secondary | ICD-10-CM

## 2016-09-16 DIAGNOSIS — Y842 Radiological procedure and radiotherapy as the cause of abnormal reaction of the patient, or of later complication, without mention of misadventure at the time of the procedure: Secondary | ICD-10-CM | POA: Diagnosis present

## 2016-09-16 DIAGNOSIS — Z923 Personal history of irradiation: Secondary | ICD-10-CM

## 2016-09-16 DIAGNOSIS — I5033 Acute on chronic diastolic (congestive) heart failure: Secondary | ICD-10-CM | POA: Diagnosis present

## 2016-09-16 DIAGNOSIS — E876 Hypokalemia: Secondary | ICD-10-CM | POA: Diagnosis present

## 2016-09-16 DIAGNOSIS — Z9981 Dependence on supplemental oxygen: Secondary | ICD-10-CM

## 2016-09-16 DIAGNOSIS — I2510 Atherosclerotic heart disease of native coronary artery without angina pectoris: Secondary | ICD-10-CM | POA: Diagnosis present

## 2016-09-16 DIAGNOSIS — I4891 Unspecified atrial fibrillation: Secondary | ICD-10-CM | POA: Diagnosis present

## 2016-09-16 DIAGNOSIS — E785 Hyperlipidemia, unspecified: Secondary | ICD-10-CM | POA: Diagnosis present

## 2016-09-16 DIAGNOSIS — Z8719 Personal history of other diseases of the digestive system: Secondary | ICD-10-CM

## 2016-09-16 DIAGNOSIS — Z7982 Long term (current) use of aspirin: Secondary | ICD-10-CM

## 2016-09-16 DIAGNOSIS — E43 Unspecified severe protein-calorie malnutrition: Secondary | ICD-10-CM | POA: Diagnosis present

## 2016-09-16 DIAGNOSIS — J810 Acute pulmonary edema: Secondary | ICD-10-CM

## 2016-09-16 DIAGNOSIS — Z825 Family history of asthma and other chronic lower respiratory diseases: Secondary | ICD-10-CM

## 2016-09-16 DIAGNOSIS — Z515 Encounter for palliative care: Secondary | ICD-10-CM

## 2016-09-16 DIAGNOSIS — I519 Heart disease, unspecified: Secondary | ICD-10-CM | POA: Diagnosis present

## 2016-09-16 DIAGNOSIS — M810 Age-related osteoporosis without current pathological fracture: Secondary | ICD-10-CM | POA: Diagnosis present

## 2016-09-16 DIAGNOSIS — I350 Nonrheumatic aortic (valve) stenosis: Secondary | ICD-10-CM

## 2016-09-16 DIAGNOSIS — I110 Hypertensive heart disease with heart failure: Secondary | ICD-10-CM | POA: Diagnosis present

## 2016-09-16 DIAGNOSIS — Z9221 Personal history of antineoplastic chemotherapy: Secondary | ICD-10-CM

## 2016-09-16 DIAGNOSIS — Z811 Family history of alcohol abuse and dependence: Secondary | ICD-10-CM

## 2016-09-16 DIAGNOSIS — J449 Chronic obstructive pulmonary disease, unspecified: Secondary | ICD-10-CM | POA: Diagnosis present

## 2016-09-16 DIAGNOSIS — K219 Gastro-esophageal reflux disease without esophagitis: Secondary | ICD-10-CM | POA: Diagnosis present

## 2016-09-16 DIAGNOSIS — C3432 Malignant neoplasm of lower lobe, left bronchus or lung: Secondary | ICD-10-CM | POA: Diagnosis present

## 2016-09-16 DIAGNOSIS — M6281 Muscle weakness (generalized): Secondary | ICD-10-CM

## 2016-09-16 LAB — I-STAT ARTERIAL BLOOD GAS, ED
Acid-Base Excess: 13 mmol/L — ABNORMAL HIGH (ref 0.0–2.0)
BICARBONATE: 37.7 mmol/L — AB (ref 20.0–28.0)
O2 SAT: 82 %
PH ART: 7.534 — AB (ref 7.350–7.450)
TCO2: 39 mmol/L (ref 0–100)
pCO2 arterial: 44.7 mmHg (ref 32.0–48.0)
pO2, Arterial: 41 mmHg — ABNORMAL LOW (ref 83.0–108.0)

## 2016-09-16 LAB — I-STAT CHEM 8, ED
BUN: 14 mg/dL (ref 6–20)
CHLORIDE: 92 mmol/L — AB (ref 101–111)
CREATININE: 0.6 mg/dL (ref 0.44–1.00)
Calcium, Ion: 1.03 mmol/L — ABNORMAL LOW (ref 1.15–1.40)
GLUCOSE: 142 mg/dL — AB (ref 65–99)
HCT: 37 % (ref 36.0–46.0)
Hemoglobin: 12.6 g/dL (ref 12.0–15.0)
POTASSIUM: 3.1 mmol/L — AB (ref 3.5–5.1)
Sodium: 137 mmol/L (ref 135–145)
TCO2: 36 mmol/L (ref 0–100)

## 2016-09-16 LAB — CBC WITH DIFFERENTIAL/PLATELET
BASOS PCT: 0 %
Basophils Absolute: 0 10*3/uL (ref 0.0–0.1)
Eosinophils Absolute: 0 10*3/uL (ref 0.0–0.7)
Eosinophils Relative: 0 %
HEMATOCRIT: 35.3 % — AB (ref 36.0–46.0)
Hemoglobin: 11.2 g/dL — ABNORMAL LOW (ref 12.0–15.0)
Lymphocytes Relative: 9 %
Lymphs Abs: 1.1 10*3/uL (ref 0.7–4.0)
MCH: 28.3 pg (ref 26.0–34.0)
MCHC: 31.7 g/dL (ref 30.0–36.0)
MCV: 89.1 fL (ref 78.0–100.0)
MONO ABS: 0.8 10*3/uL (ref 0.1–1.0)
MONOS PCT: 6 %
NEUTROS ABS: 10.4 10*3/uL — AB (ref 1.7–7.7)
Neutrophils Relative %: 85 %
Platelets: 231 10*3/uL (ref 150–400)
RBC: 3.96 MIL/uL (ref 3.87–5.11)
RDW: 18.5 % — AB (ref 11.5–15.5)
WBC: 12.3 10*3/uL — ABNORMAL HIGH (ref 4.0–10.5)

## 2016-09-16 LAB — URINALYSIS, ROUTINE W REFLEX MICROSCOPIC
BILIRUBIN URINE: NEGATIVE
Glucose, UA: NEGATIVE mg/dL
Hgb urine dipstick: NEGATIVE
Ketones, ur: NEGATIVE mg/dL
LEUKOCYTES UA: NEGATIVE
NITRITE: NEGATIVE
Protein, ur: >300 mg/dL — AB
SPECIFIC GRAVITY, URINE: 1.012 (ref 1.005–1.030)
pH: 8 (ref 5.0–8.0)

## 2016-09-16 LAB — I-STAT CG4 LACTIC ACID, ED: LACTIC ACID, VENOUS: 1.18 mmol/L (ref 0.5–1.9)

## 2016-09-16 LAB — COMPREHENSIVE METABOLIC PANEL
ALT: 42 U/L (ref 14–54)
AST: 34 U/L (ref 15–41)
Albumin: 3 g/dL — ABNORMAL LOW (ref 3.5–5.0)
Alkaline Phosphatase: 93 U/L (ref 38–126)
Anion gap: 11 (ref 5–15)
BILIRUBIN TOTAL: 0.5 mg/dL (ref 0.3–1.2)
BUN: 10 mg/dL (ref 6–20)
CALCIUM: 8.6 mg/dL — AB (ref 8.9–10.3)
CO2: 33 mmol/L — ABNORMAL HIGH (ref 22–32)
CREATININE: 0.62 mg/dL (ref 0.44–1.00)
Chloride: 94 mmol/L — ABNORMAL LOW (ref 101–111)
GFR calc Af Amer: >60 mL/min (ref >60)
Glucose, Bld: 148 mg/dL — ABNORMAL HIGH (ref 65–99)
Potassium: 3.1 mmol/L — ABNORMAL LOW (ref 3.5–5.1)
Sodium: 138 mmol/L (ref 135–145)
TOTAL PROTEIN: 5.9 g/dL — AB (ref 6.5–8.1)

## 2016-09-16 LAB — URINE MICROSCOPIC-ADD ON: RBC / HPF: NONE SEEN RBC/hpf (ref 0–5)

## 2016-09-16 LAB — I-STAT TROPONIN, ED: Troponin i, poc: 0.06 ng/mL (ref 0.00–0.08)

## 2016-09-16 LAB — MAGNESIUM: Magnesium: 1.9 mg/dL (ref 1.7–2.4)

## 2016-09-16 LAB — MRSA PCR SCREENING: MRSA BY PCR: NEGATIVE

## 2016-09-16 LAB — BRAIN NATRIURETIC PEPTIDE: B Natriuretic Peptide: 552.9 pg/mL — ABNORMAL HIGH (ref 0.0–100.0)

## 2016-09-16 LAB — PREALBUMIN: PREALBUMIN: 35.8 mg/dL (ref 18–38)

## 2016-09-16 MED ORDER — DILTIAZEM HCL-DEXTROSE 100-5 MG/100ML-% IV SOLN (PREMIX)
5.0000 mg/h | INTRAVENOUS | Status: DC
  Administered 2016-09-16: 10 mg/h via INTRAVENOUS
  Filled 2016-09-16 (×2): qty 100

## 2016-09-16 MED ORDER — ENOXAPARIN SODIUM 40 MG/0.4ML ~~LOC~~ SOLN
40.0000 mg | SUBCUTANEOUS | Status: DC
  Administered 2016-09-16 – 2016-09-22 (×7): 40 mg via SUBCUTANEOUS
  Filled 2016-09-16 (×7): qty 0.4

## 2016-09-16 MED ORDER — ACETAMINOPHEN 325 MG PO TABS: 650.0000 mg | ORAL_TABLET | Freq: Four times a day (QID) | ORAL | Status: DC | PRN

## 2016-09-16 MED ORDER — PANTOPRAZOLE SODIUM 40 MG PO TBEC
40.0000 mg | DELAYED_RELEASE_TABLET | Freq: Every day | ORAL | Status: DC
  Administered 2016-09-18 – 2016-09-23 (×6): 40 mg via ORAL
  Filled 2016-09-16 (×7): qty 1

## 2016-09-16 MED ORDER — POTASSIUM CHLORIDE CRYS ER 20 MEQ PO TBCR
40.0000 meq | EXTENDED_RELEASE_TABLET | Freq: Once | ORAL | Status: AC
  Administered 2016-09-16: 40 meq via ORAL
  Filled 2016-09-16: qty 2

## 2016-09-16 MED ORDER — ENSURE ENLIVE PO LIQD
237.0000 mL | Freq: Two times a day (BID) | ORAL | Status: DC
  Administered 2016-09-18 – 2016-09-23 (×8): 237 mL via ORAL

## 2016-09-16 MED ORDER — ATORVASTATIN CALCIUM 10 MG PO TABS
10.0000 mg | ORAL_TABLET | Freq: Every day | ORAL | Status: DC
  Administered 2016-09-16: 10 mg via ORAL
  Filled 2016-09-16: qty 1

## 2016-09-16 MED ORDER — DILTIAZEM HCL-DEXTROSE 100-5 MG/100ML-% IV SOLN (PREMIX)
5.0000 mg/h | INTRAVENOUS | Status: DC
  Administered 2016-09-16 – 2016-09-17 (×4): 12.5 mg/h via INTRAVENOUS
  Filled 2016-09-16 (×3): qty 100

## 2016-09-16 MED ORDER — SODIUM CHLORIDE 0.9 % IV BOLUS (SEPSIS)
250.0000 mL | Freq: Once | INTRAVENOUS | Status: AC
  Administered 2016-09-16: 250 mL via INTRAVENOUS

## 2016-09-16 MED ORDER — LEVALBUTEROL HCL 0.63 MG/3ML IN NEBU: 0.6300 mg | INHALATION_SOLUTION | Freq: Four times a day (QID) | RESPIRATORY_TRACT | Status: DC

## 2016-09-16 MED ORDER — AMLODIPINE BESYLATE 5 MG PO TABS
5.0000 mg | ORAL_TABLET | Freq: Every day | ORAL | Status: DC
  Administered 2016-09-16: 5 mg via ORAL
  Filled 2016-09-16 (×2): qty 1

## 2016-09-16 MED ORDER — LEVALBUTEROL HCL 0.63 MG/3ML IN NEBU
0.6300 mg | INHALATION_SOLUTION | Freq: Four times a day (QID) | RESPIRATORY_TRACT | Status: DC
  Administered 2016-09-16 – 2016-09-17 (×3): 0.63 mg via RESPIRATORY_TRACT
  Filled 2016-09-16 (×4): qty 3

## 2016-09-16 MED ORDER — GUAIFENESIN ER 600 MG PO TB12
600.0000 mg | ORAL_TABLET | Freq: Two times a day (BID) | ORAL | Status: DC
Start: 2016-09-16 — End: 2016-09-17
  Administered 2016-09-16: 600 mg via ORAL
  Filled 2016-09-16: qty 1

## 2016-09-16 MED ORDER — METHYLPREDNISOLONE SODIUM SUCC 125 MG IJ SOLR
60.0000 mg | Freq: Four times a day (QID) | INTRAMUSCULAR | Status: DC
  Administered 2016-09-16 – 2016-09-19 (×12): 60 mg via INTRAVENOUS
  Filled 2016-09-16 (×13): qty 2

## 2016-09-16 MED ORDER — TIOTROPIUM BROMIDE MONOHYDRATE 2.5 MCG/ACT IN AERS: 2.0000 | INHALATION_SPRAY | RESPIRATORY_TRACT | Status: DC

## 2016-09-16 MED ORDER — MOMETASONE FURO-FORMOTEROL FUM 200-5 MCG/ACT IN AERO
2.0000 | INHALATION_SPRAY | Freq: Two times a day (BID) | RESPIRATORY_TRACT | Status: DC
  Administered 2016-09-17 – 2016-09-19 (×5): 2 via RESPIRATORY_TRACT
  Filled 2016-09-16: qty 8.8

## 2016-09-16 MED ORDER — MORPHINE (PF) INJECTION FOR INHALATION 10 MG/ML: 10.0000 mg | RESPIRATORY_TRACT | Status: DC | PRN

## 2016-09-16 MED ORDER — POTASSIUM CHLORIDE ER 10 MEQ PO TBCR
10.0000 meq | EXTENDED_RELEASE_TABLET | Freq: Every day | ORAL | Status: DC
  Administered 2016-09-16: 10 meq via ORAL
  Filled 2016-09-16 (×4): qty 1

## 2016-09-16 MED ORDER — SODIUM CHLORIDE 0.9% FLUSH
3.0000 mL | Freq: Two times a day (BID) | INTRAVENOUS | Status: DC
  Administered 2016-09-16 – 2016-09-23 (×10): 3 mL via INTRAVENOUS

## 2016-09-16 MED ORDER — ASPIRIN EC 81 MG PO TBEC
81.0000 mg | DELAYED_RELEASE_TABLET | Freq: Every day | ORAL | Status: DC
  Administered 2016-09-16 – 2016-09-23 (×7): 81 mg via ORAL
  Filled 2016-09-16 (×8): qty 1

## 2016-09-16 MED ORDER — ADULT MULTIVITAMIN W/MINERALS CH
1.0000 | ORAL_TABLET | Freq: Every day | ORAL | Status: DC
  Filled 2016-09-16: qty 1

## 2016-09-16 MED ORDER — ACETAMINOPHEN 650 MG RE SUPP
650.0000 mg | Freq: Four times a day (QID) | RECTAL | Status: DC | PRN
  Administered 2016-09-17: 650 mg via RECTAL
  Filled 2016-09-16: qty 1

## 2016-09-16 MED ORDER — AMOXICILLIN-POT CLAVULANATE 875-125 MG PO TABS
1.0000 | ORAL_TABLET | Freq: Two times a day (BID) | ORAL | Status: DC
  Administered 2016-09-16: 1 via ORAL
  Filled 2016-09-16 (×4): qty 1

## 2016-09-16 MED ORDER — SODIUM CHLORIDE 0.9 % IV SOLN
INTRAVENOUS | Status: DC
  Administered 2016-09-16 – 2016-09-21 (×7): via INTRAVENOUS
  Administered 2016-09-23: 500 mL via INTRAVENOUS

## 2016-09-16 MED ORDER — MORPHINE SULFATE (CONCENTRATE) 10 MG/0.5ML PO SOLN
10.0000 mg | ORAL | Status: DC | PRN
  Administered 2016-09-18 – 2016-09-22 (×4): 10 mg via ORAL
  Filled 2016-09-16 (×4): qty 0.5

## 2016-09-16 MED ORDER — ALBUTEROL SULFATE (2.5 MG/3ML) 0.083% IN NEBU
5.0000 mg | INHALATION_SOLUTION | Freq: Once | RESPIRATORY_TRACT | Status: AC
  Administered 2016-09-16: 5 mg via RESPIRATORY_TRACT
  Filled 2016-09-16: qty 6

## 2016-09-16 MED ORDER — TIOTROPIUM BROMIDE MONOHYDRATE 18 MCG IN CAPS
18.0000 ug | ORAL_CAPSULE | Freq: Every day | RESPIRATORY_TRACT | Status: DC
  Administered 2016-09-17 – 2016-09-23 (×6): 18 ug via RESPIRATORY_TRACT
  Filled 2016-09-16 (×2): qty 5

## 2016-09-16 MED ORDER — DILTIAZEM HCL 100 MG IV SOLR: 5.0000 mg/h | Freq: Once | INTRAVENOUS | Status: DC

## 2016-09-16 NOTE — Telephone Encounter (Signed)
Left message for Malachy Mood at [facility] to return call.

## 2016-09-16 NOTE — ED Triage Notes (Signed)
Was just d/c yesterday from [facility] with new dx of pneumonia and a fib , got rx filled but unsure if taking and  today ems called for  Sob and wheezing, per ems pt  Got 15 albuteral, 1 mg atrovent, 125 solumedrol and 20 mg cardizem with a drip  Has 20 rt forearm, pt aaox4 pt is in afib, sats were low in 80s per ems and after breathing tx sats came up to 90s

## 2016-09-16 NOTE — ED Provider Notes (Signed)
[facility] DEPT Provider Note   CSN: [id number] Arrival date & time: 09/29/2016  1213     History   Chief Complaint Chief Complaint  Patient presents with  . Shortness of Breath    HPI Carol [name] is a 82 y.o. female.  The history is provided by the patient and the EMS personnel. No language interpreter was used.    Carol [name] is a 82 y.o. female who presents to the Emergency Department complaining of SOB.  Hx is provided by pt and EMS.  She was discharged from the hospital yesterday following a hospitalization for pneumonia. Since hospitalization she's had progressive shortness of breath and called 911 today. She denies any fevers, chest pain. She endorses bilateral lower extremity edema over the last few days. Sxs are severe, constant, worsening.    On EMS arrival she was found in A. fib with RVR and was given a Cardizem bolus of 20 mg followed by drip. She was also given A and A treatment as well as Solu-Medrol.   Past Medical History:  Diagnosis Date  . Abdominal aortic aneurysm (AAA) ([place])   . Cataract   . COPD (chronic obstructive pulmonary disease) ([place])   . Coronary artery disease   . Encounter for antineoplastic chemotherapy 05/16/2016  . Fall   . GERD (gastroesophageal reflux disease)   . Hiatal hernia   . History of esophageal stricture   . Hypertension   . Lung cancer ([place])    squamous cell carcinoma LLL  . Osteoporosis   . Peripheral arterial disease [facility])     Patient Active Problem List   Diagnosis Date Noted  . HCAP (healthcare-associated pneumonia)   . Malnutrition of moderate degree 09/13/2016  . COPD exacerbation ([place]) 09/10/2016  . Chest pain 09/07/2016  . Radiation pneumonitis ([place]) 08/16/2016  . Encounter for antineoplastic chemotherapy 05/16/2016  . Non-small cell cancer of left lung ([place]) 04/25/2016  . Mass of lower lobe of left lung   . PAD (peripheral artery disease) ([place]) 03/14/2016  . Dysphagia 02/22/2016  . History of  esophageal stricture 02/22/2016  . H/O hiatal hernia 02/22/2016  . Coronary atherosclerosis of native coronary artery 02/19/2016  . Atherosclerosis of aorta ([place]) 02/19/2016  . Former smoker 02/10/2016  . Hypoxia 02/10/2016  . Aortic stenosis 02/10/2016  . Hernia, hiatal 02/10/2016  . Acute renal failure (ARF) ([place]) 01/29/2016  . COPD (chronic obstructive pulmonary disease) ([place]) 01/29/2016  . HTN (hypertension) 01/29/2016  . Hyperlipidemia 01/29/2016  . Abnormal EKG 01/29/2016  . Lung mass 01/29/2016  . AAA (abdominal aortic aneurysm) ([place]) 01/29/2016    Past Surgical History:  Procedure Laterality Date  . CATARACT EXTRACTION    . COLONOSCOPY    . ENDOBRONCHIAL ULTRASOUND Bilateral 03/28/2016   Procedure: ENDOBRONCHIAL ULTRASOUND;  Surgeon: [name], MD;  Location: [facility] ENDOSCOPY;  Service: Cardiopulmonary;  Laterality: Bilateral;  dr. Ashok Cordia insists on mac not general anesthesia  . ESOPHAGOGASTRODUODENOSCOPY (EGD) WITH ESOPHAGEAL DILATION    . esopheal dilation  02-26-16  . EYE SURGERY      OB History    No data available       Home Medications    Prior to Admission medications   Medication Sig Start Date End Date Taking? Authorizing Provider  albuterol (PROVENTIL HFA) 108 (90 Base) MCG/ACT inhaler Inhale 2 puffs into the lungs 2 (two) times daily as needed for shortness of breath. 02/10/16 02/09/17  Renee A Kuneff, DO  amLODipine (NORVASC) 5 MG tablet Take 1 tablet (5 mg total)  by mouth daily. Patient taking differently: Take 5 mg by mouth every morning.  07/05/16 07/05/17  Renee A Kuneff, DO  amoxicillin-clavulanate (AUGMENTIN) 875-125 MG tablet Take 1 tablet by mouth every 12 (twelve) hours. 09/15/16 09/18/16  Clanford Marisa Hua, MD  aspirin EC 81 MG tablet Take 81 mg by mouth every morning.     Historical Provider, MD  atorvastatin (LIPITOR) 10 MG tablet Take 1 tablet (10 mg total) by mouth daily at 6 PM. Reported on 01/29/2016 02/19/16   Minus Breeding, MD    budesonide-formoterol [facility]) 160-4.5 MCG/ACT inhaler Inhale 2 puffs into the lungs 2 (two) times daily. 08/01/16   Tammy S Parrett, NP  diltiazem (CARDIZEM) 60 MG tablet Take 1 tablet (60 mg total) by mouth 3 (three) times daily. 09/15/16   Clanford Marisa Hua, MD  feeding supplement, ENSURE ENLIVE, (ENSURE ENLIVE) LIQD Take 237 mLs by mouth 2 (two) times daily between meals. 09/15/16   Clanford Marisa Hua, MD  furosemide (LASIX) 20 MG tablet Take 1 tablet (20 mg total) by mouth daily. 09/09/2016   Clanford Marisa Hua, MD  guaiFENesin (MUCINEX) 600 MG 12 hr tablet Take 1 tablet (600 mg total) by mouth 2 (two) times daily. 09/15/16   Clanford Marisa Hua, MD  Multiple Vitamin (MULTIVITAMIN WITH MINERALS) TABS tablet Take 1 tablet by mouth daily. 09/13/2016   Clanford Marisa Hua, MD  omeprazole (PRILOSEC) 40 MG capsule Take 1 capsule (40 mg total) by mouth 2 (two) times daily. 07/13/16   Levin Erp, PA  potassium chloride (K-DUR) 10 MEQ tablet Take 1 tablet (10 mEq total) by mouth daily. 09/15/16   Clanford Marisa Hua, MD  predniSONE (DELTASONE) 20 MG tablet Take 2 tablets (40 mg total) by mouth daily with breakfast. 09/15/16   Clanford Marisa Hua, MD  Sennosides (EX-LAX) 15 MG TABS Take 15 mg by mouth daily as needed (For constipation.).     Historical Provider, MD  Tiotropium Bromide Monohydrate (SPIRIVA RESPIMAT) 2.5 MCG/ACT AERS Inhale 2 puffs into the lungs daily. Patient taking differently: Inhale 2 puffs into the lungs every morning.  08/16/16   [name], MD    Family History Family History  Problem Relation Age of Onset  . Breast cancer Mother 70  . Alcohol abuse Father   . Emphysema Father   . Hypertension Paternal Grandmother   . Early death Brother     MVA  . Rheumatologic disease Neg Hx     Social History Social History  Substance Use Topics  . Smoking status: Former Smoker    Packs/day: 1.50    Years: 60.00    Types: Cigarettes    Quit date: 02/01/2016  .  Smokeless tobacco: Never Used  . Alcohol use 0.0 oz/week     Comment: 1-2 glasses of wine daily     Allergies   Review of patient's allergies indicates no known allergies.   Review of Systems Review of Systems  All other systems reviewed and are negative.    Physical Exam Updated Vital Signs There were no vitals taken for this visit.  Physical Exam  Constitutional: She is oriented to person, place, and time. She appears well-developed and well-nourished.  HENT:  Head: Normocephalic and atraumatic.  Cardiovascular: Normal rate and regular rhythm.   No murmur heard. Pulmonary/Chest:  Tachypnea with occasional end expiratory wheezes, decreased air movement bilaterally.  Abdominal: Soft. There is no tenderness. There is no rebound and no guarding.  Musculoskeletal: She exhibits no tenderness.  1-2+ pitting edema  bilateral lower extremities  Neurological: She is alert and oriented to person, place, and time.  Skin: Skin is warm and dry.  Psychiatric: She has a normal mood and affect. Her behavior is normal.  Nursing note and vitals reviewed.    ED Treatments / Results  Labs (all labs ordered are listed, but only abnormal results are displayed) Labs Reviewed - No data to display  EKG  EKG Interpretation None       Radiology Dg Chest 2 View  Result Date: 09/15/2016 CLINICAL DATA:  Shortness of breath and weakness today. History of lung cancer. EXAM: CHEST  2 VIEW COMPARISON:  Chest x-ray 09/10/2016 FINDINGS: The cardiac silhouette, mediastinal and hilar contours are within normal limits and stable. Stable tortuosity and calcification of the thoracic aorta. Stable dense scarring changes in the left mid lung. Stable appearing radiation changes in the left lung. No definite airspace consolidation or pleural effusion. The bony thorax is stable. IMPRESSION: Stable scarring and radiation changes in the left lung. No definite infiltrates or effusions. Electronically Signed    By: Marijo Sanes M.D.   On: 09/15/2016 09:40    Procedures Procedures (including critical care time)  Medications Ordered in ED Medications - No data to display   Initial Impression / Assessment and Plan / ED Course  I have reviewed the triage vital signs and the nursing notes.  Pertinent labs & imaging results that were available during my care of the patient were reviewed by me and considered in my medical decision making (see chart for details).  Clinical Course    Patient here for progressive shortness of breath following recent hospitalization for pneumonia. The patient profoundly short of breath and hypoxic for EMS. She is symptomatically improved in the emergency department but does have persistent tachypnea with significant oxygen requirement. She is in A. fib with RVR and diltiazem was continued for rate control. There is no current evidence of pneumonia on exam or imaging. Plan to admit for dyspnea with increased oxygen requirement, A. fib with RVR.  Final Clinical Impressions(s) / ED Diagnoses   Final diagnoses:  None    New Prescriptions New Prescriptions   No medications on file     Quintella Reichert, MD 09/17/16 [phone]

## 2016-09-16 NOTE — ED Notes (Signed)
Admit dr [place] Constable see pt and she will put in more orders potassium ordered will need to be crushed and put in  app[place] e she states. Son in Sports coach in room,

## 2016-09-16 NOTE — H&P (Signed)
History and Physical    Carol [name] NUU:[id number] DOB: 11-Sep-1932 DOA: 09/30/2016   PCP: [name], DO   Patient coming from/Resides with: Private residence/lives with daughter and son-in-law  Admission status: Inpatient/stepdown -medically necessary to stay a minimum 2 midnights to rule out impending and/or unexpected changes in physiologic status that may differ from initial evaluation performed in the ER and/or at time of admission. Patient presents with acute on chronic hypoxemic respiratory failure likely related to acute exacerbation of radiation pneumonitis, also with acute exacerbation of atrial fibrillation now with RVR suspected precipitated by hypoxemia and now requiring IV Cardizem. Patient appears dehydrated with contraction alkalosis noted on ABG. Review of intake and output on the day of discharge demonstrated patient had 540 in and 2250 out and continued to take Lasix after discharge despite having poor oral intake for multiple days. She also has discovered after returning to the home environment that she is too physically deconditioned to continue at home with home health and wishes to be reevaluated to determine if she can qualify for skilled nursing facility placement once she is medically stable  Chief Complaint: Shortness of breath  HPI: Carol [name] is a 82 y.o. female with medical history significant for former tobacco abuse and associated oxygen dependent COPD, no non-all cell carcinoma with last radiation treatment July 2017, known CAD, known AAA, aortic stenosis with associated mild diastolic dysfunction, hypertension, atrial fibrillation not on anticoagulation, dyslipidemia, known esophageal stricture status post dilatation with persistent dysphagia. Patient was recently discharged on 10/12 after an admission for acute respiratory failure surmised related to HCAP more likely related to radiation pneumonitis. Prior to last admission patient had been on steroids for  radiation pneumonitis and had been doing well until the steroids were tapered with resultant acute respiratory distress. At time of discharge patient was prescribed prednisone 40 mg daily as well as Augmentin to complete the HCAP therapy. Patient received appropriate medication doses prior to leaving the hospital but had yet to have prescriptions filled. Unfortunately this morning she developed acute onset of respiratory distress MS was called to the home. On their evaluation she was short of breath with wheezing. She was given 15 mg of albuterol with a 1 mg Atrovent nebulizer, 125 mg a sign Medrol IV and was given a 20 mg Cardizem bolus and started on an infusion at 5 mg per hour. Her O2 saturations on 4 L oxygen were 80% according to EMS but after breathing treatment came up to 90%.  In further discussion with the patient and her son-in-law at the bedside during prior admission patient had improved rest for symptoms with combination of oral and inhaled morphine. Patient has had issues with anorexia and possibly difficulty swallowing despite having had prior esophageal dilatation. As noted above and review of intake and output on date of discharge patient was in a negative fluid balance but continued to take her diuretics at home. She has not had any chest pain, awareness of tachycardia palpitations, no nausea, vomiting or diarrhea and no abdominal pain.  In addition, patient admitted that since arriving to the home environment she has not done well in general. We discussed that she was profoundly deconditioned prior to previous admission and developed further deconditioning during that admission. She admits that she probably should've gone to the skilled nursing facility as recommended and at this time is amenable to discharging to skilled nursing facility once medically stable.  ED Course:  Vital Signs: BP 137/64 (BP Location: Left Arm)   Pulse Marland Kitchen)  121   Temp 97.9 F (36.6 C) (Oral)   Resp 21   SpO2  100%  PCXR: Stable appearance of chest with left hilar and perihilar densities likely from scarring, emphysema Lab data: Sodium 137, potassium 3.1, chloride 92, BUN 10, creatinine 0.62, glucose 148, LFTs within normal limits except for albumin 3.0 and total protein 5.9, BNP 553, poc troponin 0.06, lactic acid 1.18, WBC 12,300 with neutrophils 85% and absolute neutrophils 10.4% (labs obtained in patient recently on prednisone and after receipt of high-dose Solu-Medrol per EMS) ABG: PH 7.54, PCO2 45, PO2 41, acid-base 13, bicarbonate 37, saturation 82% Medications and treatments: Cardizem infusion at 5 mg per hour, albuterol neb 1  Review of Systems:  In addition to the HPI above,  No Fever-chills, myalgias or other constitutional symptoms No Headache, changes with Vision or hearing, new weakness, tingling, numbness in any extremity, dizziness, dysarthria or word finding difficulty, gait disturbance, tremors or seizure activity No problems swallowing Liquids, indigestion/reflux, choking or coughing while eating, abdominal pain with or after eating; positive anorexia No Chest pain, Cough, palpitations No Abdominal pain, N/V, melena,hematochezia, dark tarry stools, constipation No dysuria, malodorous urine, hematuria or flank pain No new skin rashes, lesions, masses or bruises, No new joint pains, aches, swelling or redness No recent unintentional weight gain or loss No polyuria, polydypsia or polyphagia   Past Medical History:  Diagnosis Date  . Abdominal aortic aneurysm (AAA) ([place])   . Cataract   . COPD (chronic obstructive pulmonary disease) ([place])   . Coronary artery disease   . Encounter for antineoplastic chemotherapy 05/16/2016  . Fall   . GERD (gastroesophageal reflux disease)   . Hiatal hernia   . History of esophageal stricture   . Hypertension   . Lung cancer ([place])    squamous cell carcinoma LLL  . Osteoporosis   . Peripheral arterial disease [facility])     Past Surgical  History:  Procedure Laterality Date  . CATARACT EXTRACTION    . COLONOSCOPY    . ENDOBRONCHIAL ULTRASOUND Bilateral 03/28/2016   Procedure: ENDOBRONCHIAL ULTRASOUND;  Surgeon: [name], MD;  Location: [facility] END[facility]COPY;  Service: Cardiopulmonary;  Laterality: Bilateral;  dr. Ashok Cordia insists on mac not general anesthesia  . ESOPHAGOGASTRODUODENOSCOPY (EGD) WITH ESOPHAGEAL DILATION    . esopheal dilation  02-26-16  . EYE SURGERY      Social History   Social History  . Marital status: Divorced    Spouse name: N/A  . Number of children: 44  . Years of education: N/A   Occupational History  . Retired     Glass blower/designer, Clinical cytogeneticist   Social History Main Topics  . Smoking status: Former Smoker    Packs/day: 1.50    Years: 60.00    Types: Cigarettes    Quit date: 02/01/2016  . Smokeless tobacco: Never Used  . Alcohol use 0.0 oz/week     Comment: 1-2 glasses of wine daily  . Drug use: No  . Sexual activity: No   Other Topics Concern  . Not on file   Social History Narrative   Divorced. Recently moved to [place] from [place], and lives with her daughter and son-in-law.   HS diploma. Retired.    Former smoker: Quit February 2017, with at least 55-pack-year history.   Occasional glass of wine, no drug use.   Caffeinated beverages, takes a daily vitamin.   Wears her seatbelt. Has dentures.   Smoke detector in the home.   Feels safe in  her relationships.       Pulmonary:   From [place], [place]. Previously has lived in [place]. She moved to [facility] in 2013. 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 No pets currently. No bird exposure. No mold exposure.     Mobility: Previous admission PT notes documents patient to plus assist to ambulate utilizing rolling walker and becomes quickly fatigue despite utilization of oxygen-SNF recommended at discharge Work history: Not obtained   No Known Allergies  Family History  Problem  Relation Age of Onset  . Breast cancer Mother 72  . Alcohol abuse Father   . Emphysema Father   . Hypertension Paternal Grandmother   . Early death Brother     MVA  . Rheumatologic disease Neg Hx      Prior to Admission medications   Medication Sig Start Date End Date Taking? Authorizing Provider  albuterol (PROVENTIL HFA) 108 (90 Base) MCG/ACT inhaler Inhale 2 puffs into the lungs 2 (two) times daily as needed for shortness of breath. 02/10/16 02/09/17  Renee A Kuneff, DO  amLODipine (NORVASC) 5 MG tablet Take 1 tablet (5 mg total) by mouth daily. Patient taking differently: Take 5 mg by mouth every morning.  07/05/16 07/05/17  Renee A Kuneff, DO  amoxicillin-clavulanate (AUGMENTIN) 875-125 MG tablet Take 1 tablet by mouth every 12 (twelve) hours. 09/15/16 09/18/16  Clanford Marisa Hua, MD  aspirin EC 81 MG tablet Take 81 mg by mouth every morning.     Historical Provider, MD  atorvastatin (LIPITOR) 10 MG tablet Take 1 tablet (10 mg total) by mouth daily at 6 PM. Reported on 01/29/2016 02/19/16   Minus Breeding, MD  budesonide-formoterol [facility]) 160-4.5 MCG/ACT inhaler Inhale 2 puffs into the lungs 2 (two) times daily. 08/01/16   Tammy S Parrett, NP  diltiazem (CARDIZEM) 60 MG tablet Take 1 tablet (60 mg total) by mouth 3 (three) times daily. 09/15/16   Clanford Marisa Hua, MD  feeding supplement, ENSURE ENLIVE, (ENSURE ENLIVE) LIQD Take 237 mLs by mouth 2 (two) times daily between meals. 09/15/16   Clanford Marisa Hua, MD  furosemide (LASIX) 20 MG tablet Take 1 tablet (20 mg total) by mouth daily. 09/12/2016   Clanford Marisa Hua, MD  guaiFENesin (MUCINEX) 600 MG 12 hr tablet Take 1 tablet (600 mg total) by mouth 2 (two) times daily. 09/15/16   Clanford Marisa Hua, MD  Multiple Vitamin (MULTIVITAMIN WITH MINERALS) TABS tablet Take 1 tablet by mouth daily. 09/17/2016   Clanford Marisa Hua, MD  omeprazole (PRILOSEC) 40 MG capsule Take 1 capsule (40 mg total) by mouth 2 (two) times daily. 07/13/16   Levin Erp, PA  potassium chloride (K-DUR) 10 MEQ tablet Take 1 tablet (10 mEq total) by mouth daily. 09/15/16   Clanford Marisa Hua, MD  predniSONE (DELTASONE) 20 MG tablet Take 2 tablets (40 mg total) by mouth daily with breakfast. 09/15/16   Clanford Marisa Hua, MD  Sennosides (EX-LAX) 15 MG TABS Take 15 mg by mouth daily as needed (For constipation.).     Historical Provider, MD  Tiotropium Bromide Monohydrate (SPIRIVA RESPIMAT) 2.5 MCG/ACT AERS Inhale 2 puffs into the lungs daily. Patient taking differently: Inhale 2 puffs into the lungs every morning.  08/16/16   [name], MD    Physical Exam: Vitals:   09/30/2016 1430 10/01/2016 1445 09/14/2016 1500 09/11/2016 1504  BP: 147/74 176/76 163/60 176/76  Pulse: (!) 40 (!) 40 71 71  Resp: Marland Kitchen)  27 (!) 30 (!) 27 (!) 40  Temp:      TempSrc:      SpO2: 98% 99% 98% 98%      Constitutional: NAD, calm, comfortable on 100% nonrebreather mask Eyes: PERRL, lids and conjunctivae normal ENMT: Mucous membranes are dry. Posterior pharynx clear of any exudate or lesions although there is some whitish yellow debris on the time concerning for possible thrush. Normal dentition.  Neck: normal, supple, no masses, no thyromegaly Respiratory: Diffuse fine expiratory crackles, Normal respiratory effort rest. No accessory muscle use. 100% nonrebreather mask Cardiovascular: Irregular rate with associated atrial fibrillation with RVR, no murmurs / rubs / gallops. Focal below the knee bilateral 2+ lower extremity edema. 2+ pedal pulses. No carotid bruits.  Abdomen: no tenderness, no masses palpated. No hepatosplenomegaly. Bowel sounds positive.  Musculoskeletal: no clubbing / cyanosis. No joint deformity upper and lower extremities. Good ROM, no contractures. Normal muscle tone.  Skin: no rashes, lesions, ulcers. No induration-old large bruise w/o contusion right lower abdomen Neurologic: CN 2-12 grossly intact. Sensation intact, DTR normal. Strength 5/5 x all 4  extremities.  Psychiatric: Normal judgment and insight. Alert and oriented x 3. Normal mood.    Labs on Admission: I have personally reviewed following labs and imaging studies  CBC:  Recent Labs Lab 09/10/16 1349 09/11/16 0330 09/14/16 0524 09/20/2016 1238 09/21/2016 1255  WBC 5.9 5.3 6.1 12.3*  --   NEUTROABS  --   --   --  10.4*  --   HGB 13.0 12.2 10.7* 11.2* 12.6  HCT 39.2 38.1 33.6* 35.3* 37.0  MCV 87.3 89.6 90.1 89.1  --   PLT 231 207 217 231  --    Basic Metabolic Panel:  Recent Labs Lab 09/12/16 0321 09/13/16 0511 09/14/16 0524 09/15/16 0518 09/17/2016 1238 09/05/2016 1255  NA 138 141 141 140 138 137  K 3.2* 4.1 2.8* 4.0 3.1* 3.1*  CL 101 104 98* 101 94* 92*  CO2 27 28 33* 33* 33*  --   GLUCOSE 171* 139* 153* 154* 148* 142*  BUN '19 17 20 19 10 14  '$ CREATININE 0.66 0.68 0.65 0.51 0.62 0.60  CALCIUM 8.3* 8.4* 8.4* 8.4* 8.6*  --    GFR: Estimated Creatinine Clearance: 43.8 mL/min (by C-G formula based on SCr of 0.6 mg/dL). Liver Function Tests:  Recent Labs Lab 09/11/16 0330 09/19/2016 1238  AST 30 34  ALT 37 42  ALKPHOS 100 93  BILITOT 0.6 0.5  PROT 6.8 5.9*  ALBUMIN 3.0* 3.0*   No results for input(s): LIPASE, AMYLASE in the last 168 hours. No results for input(s): AMMONIA in the last 168 hours. Coagulation Profile: No results for input(s): INR, PROTIME in the last 168 hours. Cardiac Enzymes: No results for input(s): CKTOTAL, CKMB, CKMBINDEX, TROPONINI in the last 168 hours. BNP (last 3 results)  Recent Labs  08/01/16 1048  PROBNP 106.0*   HbA1C: No results for input(s): HGBA1C in the last 72 hours. CBG: No results for input(s): GLUCAP in the last 168 hours. Lipid Profile: No results for input(s): CHOL, HDL, LDLCALC, TRIG, CHOLHDL, LDLDIRECT in the last 72 hours. Thyroid Function Tests: No results for input(s): TSH, T4TOTAL, FREET4, T3FREE, THYROIDAB in the last 72 hours. Anemia Panel: No results for input(s): VITAMINB12, FOLATE, FERRITIN,  TIBC, IRON, RETICCTPCT in the last 72 hours. Urine analysis:    Component Value Date/Time   COLORURINE AMBER (A) 01/29/2016 2052   APPEARANCEUR CLOUDY (A) 01/29/2016 2052   LABSPEC 1.046 (H) 01/29/2016 2052  PHURINE 5.0 01/29/2016 2052   GLUCOSEU NEGATIVE 01/29/2016 2052   HGBUR NEGATIVE 01/29/2016 2052   BILIRUBINUR MODERATE (A) 01/29/2016 2052   KETONESUR 15 (A) 01/29/2016 2052   PROTEINUR 30 (A) 01/29/2016 2052   NITRITE NEGATIVE 01/29/2016 2052   LEUKOCYTESUR TRACE (A) 01/29/2016 2052   Sepsis Labs: '@LABRCNTIP'$ (procalcitonin:4,lacticidven:4) ) Recent Results (from the past 240 hour(s))  Blood culture (routine x 2)     Status: None   Collection Time: 09/10/16  1:48 PM  Result Value Ref Range Status   Specimen Description BLOOD RIGHT ANTECUBITAL  Final   Special Requests BOTTLES DRAWN AEROBIC AND ANAEROBIC 5CC  Final   Culture   Final    NO GROWTH 5 DAYS Performed at [facility]    Report Status 09/15/2016 FINAL  Final  Blood culture (routine x 2)     Status: None   Collection Time: 09/10/16  4:13 PM  Result Value Ref Range Status   Specimen Description BLOOD RIGHT ANTECUBITAL  Final   Special Requests BOTTLES DRAWN AEROBIC AND ANAEROBIC 5CC  Final   Culture   Final    NO GROWTH 5 DAYS Performed at [facility]    Report Status 09/15/2016 FINAL  Final  MRSA PCR Screening     Status: None   Collection Time: 09/10/16  6:45 PM  Result Value Ref Range Status   MRSA by PCR NEGATIVE NEGATIVE Final    Comment:        The GeneXpert MRSA Assay (FDA approved for NASAL specimens only), is one component of a comprehensive MRSA colonization surveillance program. It is not intended to diagnose MRSA infection nor to guide or monitor treatment for MRSA infections.      Radiological Exams on Admission: Dg Chest 2 View  Result Date: 09/15/2016 CLINICAL DATA:  Shortness of breath and weakness today. History of lung cancer. EXAM: CHEST  2 VIEW COMPARISON:   Chest x-ray 09/10/2016 FINDINGS: The cardiac silhouette, mediastinal and hilar contours are within normal limits and stable. Stable tortuosity and calcification of the thoracic aorta. Stable dense scarring changes in the left mid lung. Stable appearing radiation changes in the left lung. No definite airspace consolidation or pleural effusion. The bony thorax is stable. IMPRESSION: Stable scarring and radiation changes in the left lung. No definite infiltrates or effusions. Electronically Signed   By: Marijo Sanes M.D.   On: 09/15/2016 09:40   Dg Chest Port 1 View  Result Date: 10/01/2016 CLINICAL DATA:  Pt c/o SOB. Hx of COPD, CAD, Hiatal hernia, Hypertension, and peripheral arterial disease. Former smoker. EXAM: PORTABLE CHEST 1 VIEW COMPARISON:  09/15/2016. FINDINGS: Atherosclerotic calcification of the aortic arch and descending thoracic aorta. Bandlike scarring or atelectasis in the left suprahilar and infrahilar regions. No pleural effusion identified. Bony demineralization. Suspected emphysema. IMPRESSION: 1. Stable appearance the chest with left hilar and perihilar densities likely primarily from scarring, and atherosclerosis of the thoracic aorta. 2. Emphysema. Electronically Signed   By: Van Clines M.D.   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/ Radiation pneumonitis    Recent HCAP treatment -Patient presented with acute respiratory failure and worsening of underlying chronic hypoxemia after being discharged  less than 24 hours for an admission related to possible HCAP but primary etiology to breathing issues from recurrent radiation pneumonitis (per  PCCM) -Continue steroids; utilize Solu-Medrol initially and then transition to prednisone-was on 40 mg daily at discharge as recommended by PCCM -Patient quite deconditioned upon leaving and certainly attempts to manage at home independently contributed to patient's respiratory distress -Continue supportive care with oxygen-currently on 100 % nonrebreather need to wean as tolerated -No hypercarbia on ABG -With RVR will utilize Xopenex for bronchodilator; continue preadmission Symbicort and Spiriva -Continue preadmission Mucinex -Patient had good results with morphine nebs as well as oral nebs for respiratory symptoms so we will order this admission and recommended continuing upon discharge -Continue Augmentin-today would be day 7 of antibiotics -Son-in-law reports that based on previous imaging post radiation tx that her lung mass has decreased in size and apparently is not as solid as prior to radiation -Patient and family may benefit from palliative care consultation for goals of care-she is not due to return to her oncologist until November  Active Problems:   Atrial fibrillation with RVR -likely precipitated by pre admit hypoxia -Continue Cardizem infusion and hold oral Cardizem for now -Not on AC secondary to age and fall risk -Volume depletion may be contributing -CHADVASc = 3    Dehydration -Patient with multiple days of anorexia and poor oral intake in setting of continued use of diuretics -Intake and output for a.m. of 10/12 was 540 in and 2250 out -ABG w/ contraction alkalosis pH 7.54 -Normal saline bolus 250 mL 1 and then gentle IV fluids normal saline at 30 mL per hour -Hold Lasix -Because of hypoalbuminemia at risk for third spacing of any fluids given so monitor closely    Leukocytosis -Likely secondary to demargination from steroids since afebrile -Check urinalysis and culture to rule out UTI -Follow labs    HTN (hypertension) -Current blood pressure well  controlled -Continue preadmission Norvasc    Aortic stenosis/ Mild diastolic dysfunction -Despite mildly elevated BNP patient actually appears clinically dry with compensated heart failure -Chest x-ray is stable and without evidence of volume overload -Diuretic on hold -No apparent syncopal events at home    Acute hypokalemia -40 mEq of potassium now-crush or give in applesauce or pudding -Continue low-dose potassium as prior to admission despite holding Lasix -Follow labs -Recommend with current atrial fibrillation with RVR keep potassium >/= 4.0 -Check magnesium level    Physical deconditioning -Previous admission recommendation was for skilled nursing facility and patient now willing to discharge once medically stable -Uncertain if need to repeat PT/OT evaluation since has returned to the hospital within 24 hours of previous discharge    Hyperlipidemia -Continue preadmission Lipitor    Dysphagia/History of esophageal stricture/Severe protein-calorie malnutrition  -Patient reports significant anorexia and apparent difficulty tolerating more solid foods -SLP evaluation for appropriate food consistency -Begin soft diet initially -Continue protein shake supplementation -Check pre-albumin -Continue PPI      DVT prophylaxis: Lovenox Code Status: DO NOT RESUSCITATE Family Communication: Son-in-law at bedside  Disposition Plan: Potential discharge to skilled nursing facility once medically stable Consults called: None    Rane Dumm L. ANP-BC [facility] Pager [phone]   If 7PM-7AM, please contact night-coverage [email or web address] Password [email or web address]  09/13/2016, 3:27 PM

## 2016-09-17 ENCOUNTER — Other Ambulatory Visit: Payer: Self-pay

## 2016-09-17 DIAGNOSIS — Z681 Body mass index (BMI) 19 or less, adult: Secondary | ICD-10-CM | POA: Diagnosis not present

## 2016-09-17 DIAGNOSIS — J432 Centrilobular emphysema: Secondary | ICD-10-CM | POA: Diagnosis not present

## 2016-09-17 DIAGNOSIS — Z7189 Other specified counseling: Secondary | ICD-10-CM | POA: Diagnosis not present

## 2016-09-17 DIAGNOSIS — K219 Gastro-esophageal reflux disease without esophagitis: Secondary | ICD-10-CM | POA: Diagnosis present

## 2016-09-17 DIAGNOSIS — Z79899 Other long term (current) drug therapy: Secondary | ICD-10-CM | POA: Diagnosis not present

## 2016-09-17 DIAGNOSIS — I4891 Unspecified atrial fibrillation: Secondary | ICD-10-CM | POA: Diagnosis not present

## 2016-09-17 DIAGNOSIS — J810 Acute pulmonary edema: Secondary | ICD-10-CM | POA: Diagnosis not present

## 2016-09-17 DIAGNOSIS — E876 Hypokalemia: Secondary | ICD-10-CM | POA: Diagnosis not present

## 2016-09-17 DIAGNOSIS — Z515 Encounter for palliative care: Secondary | ICD-10-CM | POA: Diagnosis not present

## 2016-09-17 DIAGNOSIS — I482 Chronic atrial fibrillation: Secondary | ICD-10-CM | POA: Diagnosis present

## 2016-09-17 DIAGNOSIS — J441 Chronic obstructive pulmonary disease with (acute) exacerbation: Secondary | ICD-10-CM | POA: Diagnosis present

## 2016-09-17 DIAGNOSIS — E43 Unspecified severe protein-calorie malnutrition: Secondary | ICD-10-CM | POA: Diagnosis present

## 2016-09-17 DIAGNOSIS — Z7952 Long term (current) use of systemic steroids: Secondary | ICD-10-CM | POA: Diagnosis not present

## 2016-09-17 DIAGNOSIS — I350 Nonrheumatic aortic (valve) stenosis: Secondary | ICD-10-CM | POA: Diagnosis not present

## 2016-09-17 DIAGNOSIS — E873 Alkalosis: Secondary | ICD-10-CM | POA: Diagnosis present

## 2016-09-17 DIAGNOSIS — Z9221 Personal history of antineoplastic chemotherapy: Secondary | ICD-10-CM | POA: Diagnosis not present

## 2016-09-17 DIAGNOSIS — R0789 Other chest pain: Secondary | ICD-10-CM | POA: Diagnosis not present

## 2016-09-17 DIAGNOSIS — I10 Essential (primary) hypertension: Secondary | ICD-10-CM | POA: Diagnosis not present

## 2016-09-17 DIAGNOSIS — J9811 Atelectasis: Secondary | ICD-10-CM | POA: Diagnosis present

## 2016-09-17 DIAGNOSIS — Z87891 Personal history of nicotine dependence: Secondary | ICD-10-CM | POA: Diagnosis not present

## 2016-09-17 DIAGNOSIS — C3432 Malignant neoplasm of lower lobe, left bronchus or lung: Secondary | ICD-10-CM | POA: Diagnosis present

## 2016-09-17 DIAGNOSIS — Z7951 Long term (current) use of inhaled steroids: Secondary | ICD-10-CM | POA: Diagnosis not present

## 2016-09-17 DIAGNOSIS — I5033 Acute on chronic diastolic (congestive) heart failure: Secondary | ICD-10-CM | POA: Diagnosis present

## 2016-09-17 DIAGNOSIS — I739 Peripheral vascular disease, unspecified: Secondary | ICD-10-CM | POA: Diagnosis present

## 2016-09-17 DIAGNOSIS — Z923 Personal history of irradiation: Secondary | ICD-10-CM | POA: Diagnosis not present

## 2016-09-17 DIAGNOSIS — F411 Generalized anxiety disorder: Secondary | ICD-10-CM | POA: Diagnosis not present

## 2016-09-17 DIAGNOSIS — Z7982 Long term (current) use of aspirin: Secondary | ICD-10-CM | POA: Diagnosis not present

## 2016-09-17 DIAGNOSIS — J700 Acute pulmonary manifestations due to radiation: Secondary | ICD-10-CM | POA: Diagnosis present

## 2016-09-17 DIAGNOSIS — R0602 Shortness of breath: Secondary | ICD-10-CM | POA: Diagnosis present

## 2016-09-17 DIAGNOSIS — I110 Hypertensive heart disease with heart failure: Secondary | ICD-10-CM | POA: Diagnosis present

## 2016-09-17 DIAGNOSIS — I2510 Atherosclerotic heart disease of native coronary artery without angina pectoris: Secondary | ICD-10-CM | POA: Diagnosis present

## 2016-09-17 DIAGNOSIS — R1312 Dysphagia, oropharyngeal phase: Secondary | ICD-10-CM | POA: Diagnosis present

## 2016-09-17 DIAGNOSIS — Z8719 Personal history of other diseases of the digestive system: Secondary | ICD-10-CM | POA: Diagnosis not present

## 2016-09-17 DIAGNOSIS — J9621 Acute and chronic respiratory failure with hypoxia: Secondary | ICD-10-CM | POA: Diagnosis present

## 2016-09-17 DIAGNOSIS — Z9981 Dependence on supplemental oxygen: Secondary | ICD-10-CM | POA: Diagnosis not present

## 2016-09-17 DIAGNOSIS — Y842 Radiological procedure and radiotherapy as the cause of abnormal reaction of the patient, or of later complication, without mention of misadventure at the time of the procedure: Secondary | ICD-10-CM | POA: Diagnosis present

## 2016-09-17 LAB — CBC
HCT: 33.9 % — ABNORMAL LOW (ref 36.0–46.0)
Hemoglobin: 10.6 g/dL — ABNORMAL LOW (ref 12.0–15.0)
MCH: 28 pg (ref 26.0–34.0)
MCHC: 31.3 g/dL (ref 30.0–36.0)
MCV: 89.7 fL (ref 78.0–100.0)
PLATELETS: 210 10*3/uL (ref 150–400)
RBC: 3.78 MIL/uL — AB (ref 3.87–5.11)
RDW: 18.9 % — ABNORMAL HIGH (ref 11.5–15.5)
WBC: 8.3 10*3/uL (ref 4.0–10.5)

## 2016-09-17 LAB — COMPREHENSIVE METABOLIC PANEL
ALK PHOS: 82 U/L (ref 38–126)
ALT: 42 U/L (ref 14–54)
AST: 24 U/L (ref 15–41)
Albumin: 2.7 g/dL — ABNORMAL LOW (ref 3.5–5.0)
Anion gap: 10 (ref 5–15)
BILIRUBIN TOTAL: 0.8 mg/dL (ref 0.3–1.2)
BUN: 11 mg/dL (ref 6–20)
CALCIUM: 8.4 mg/dL — AB (ref 8.9–10.3)
CO2: 33 mmol/L — ABNORMAL HIGH (ref 22–32)
CREATININE: 0.56 mg/dL (ref 0.44–1.00)
Chloride: 97 mmol/L — ABNORMAL LOW (ref 101–111)
Glucose, Bld: 198 mg/dL — ABNORMAL HIGH (ref 65–99)
Potassium: 4 mmol/L (ref 3.5–5.1)
Sodium: 140 mmol/L (ref 135–145)
TOTAL PROTEIN: 5.4 g/dL — AB (ref 6.5–8.1)

## 2016-09-17 LAB — URINE CULTURE

## 2016-09-17 MED ORDER — LEVALBUTEROL HCL 0.63 MG/3ML IN NEBU
0.6300 mg | INHALATION_SOLUTION | RESPIRATORY_TRACT | Status: DC | PRN
  Administered 2016-09-18 – 2016-09-23 (×4): 0.63 mg via RESPIRATORY_TRACT
  Filled 2016-09-17 (×4): qty 3

## 2016-09-17 MED ORDER — DILTIAZEM HCL 60 MG PO TABS
60.0000 mg | ORAL_TABLET | Freq: Three times a day (TID) | ORAL | Status: DC
  Administered 2016-09-18 (×2): 60 mg via ORAL
  Filled 2016-09-17 (×2): qty 1

## 2016-09-17 MED ORDER — PROCHLORPERAZINE EDISYLATE 5 MG/ML IJ SOLN
10.0000 mg | Freq: Four times a day (QID) | INTRAMUSCULAR | Status: DC | PRN
  Administered 2016-09-18: 10 mg via INTRAVENOUS
  Filled 2016-09-17 (×2): qty 2

## 2016-09-17 MED ORDER — ONDANSETRON HCL 4 MG/2ML IJ SOLN: 4.0000 mg | Freq: Four times a day (QID) | INTRAMUSCULAR | Status: DC | PRN

## 2016-09-17 MED ORDER — ONDANSETRON HCL 4 MG/2ML IJ SOLN
4.0000 mg | Freq: Four times a day (QID) | INTRAMUSCULAR | Status: DC | PRN
Start: 2016-09-17 — End: 2016-09-17
  Administered 2016-09-17 (×2): 4 mg via INTRAVENOUS
  Filled 2016-09-17 (×2): qty 2

## 2016-09-17 NOTE — Plan of Care (Signed)
Problem: Activity: Goal: Risk for activity intolerance will decrease Outcome: Progressing Patient sat on side of bed with dangle for 30 minutes. Patient became very tired and returned to bed, but continues to try to increase activity as tolerated.  Problem: Nutrition: Goal: Adequate nutrition will be maintained Outcome: Progressing Patient did pass swallow evaluation today and is now a dysphagia 3 diet with thin liquids. Meds crushed in puree. However, she is having problems with nausea/vomiting and was unable to tolerate her medications crushed on this shift. MD notified and patient given prescription for nausea medications. Continuing to monitor.

## 2016-09-17 NOTE — Progress Notes (Signed)
Attempted to give patient her daily medications. They were crushed in applesauce. Patient took two bites of crushed pills and vomited. Unable to give anything else PO. MD notified.   Milford Cage, RN

## 2016-09-17 NOTE — Progress Notes (Signed)
TEAM 1 - Stepdown/ICU TEAM  Carol [name]  ZDG:[id number] DOB: 1932-05-03 DOA: 09/13/2016 PCP: [name], DO    Brief Narrative:  82 y.o. female with history of oxygen dependent COPD, non-small-cell lung cancer with last radiation treatment July 2017, CAD, AAA, aortic stenosis with associated mild diastolic dysfunction, hypertension, atrial fibrillation not on anticoagulation, dyslipidemia, and known esophageal stricture status post dilatation with persistent dysphagia who was discharged on 10/12 after an admission for acute respiratory failure related to HCAP v/s radiation pneumonitis. Prior to this last admission patient had been on steroids for radiation pneumonitis and had been doing well until the steroids were tapered with resultant acute respiratory distress. At time of discharge patient was prescribed prednisone 40 mg daily as well as Augmentin. Patient received appropriate medication doses prior to leaving the hospital but had yet to have prescriptions filled when she developed acute onset of respiratory distress.  She has previously been advised to pursue SNF placement, but she refused.  She admits that she probably should've gone to the skilled nursing facility as recommended and at this time is amenable to discharging to skilled nursing facility once medically stable.  Significant Events: 10/13 readmission to the hospital  Subjective: The patient is resting comfortably in bed.  She reports her shortness of breath is much improved.  She denies fevers chills nausea vomiting or abdominal pain at this time.  Assessment & Plan:  Acute hypoxic respiratory failure due to radiation pneumonitis +/- HCAP Cont steroids and abx as prescribed at time of d/c - stabilizing  Atrial fibrillation with RVR CHADsVASC score 3 - no anticoagulation candidate given age and fall risk - rate currently well controlled - attempt to transition back to oral medications in a.m.  Aortic  stenosis Not clinically significant at this time  Mild diastolic dysfunction Appears euvolemic at present  Hypertension Poorly controlled - adjust medical treatment and follow  Non-small cell lung cancer of left lung patient is status post chemotherapy and radiation treatment - follow up w/ Oncology as outpatient  Dysphagia - History of esophageal stricture status post dilation, no odynophagia at this time - continue PPI - follow w/ intake   Moderate protein calorie malnutrition  Hypokalemia Replaced   DVT prophylaxis: Lovenox Code Status: DNR - NO CODE Family Communication: no family present at time of exam  Disposition Plan: SDU - for ultimate SNF placement  Consultants:  none  Antimicrobials:  Augmentin 10/10 >  Objective: Blood pressure (!) 164/77, pulse (!) 109, temperature 98.6 F (37 C), temperature source Axillary, resp. rate 19, height 5' 3.6" (1.615 m), weight 50.3 kg (110 lb 12.8 oz), SpO2 93 %.  Intake/Output Summary (Last 24 hours) at 09/17/16 1659 Last data filed at 09/17/16 1636  Gross per 24 hour  Intake           566.79 ml  Output             1075 ml  Net          -508.21 ml   Filed Weights   09/13/2016 1830 09/17/16 0500  Weight: 52.3 kg (115 lb 4.8 oz) 50.3 kg (110 lb 12.8 oz)    Examination: General: No acute respiratory distress at rest Lungs: Clear to auscultation bilaterally without wheezes or crackles Cardiovascular: Irregularly irregular with rate controlled without appreciable gallop or rub Abdomen: Nontender, nondistended, soft, bowel sounds positive, no rebound, no ascites, no appreciable mass Extremities: No significant cyanosis, clubbing, or edema bilateral lower extremities  CBC:  Recent  Labs Lab 09/11/16 0330 09/14/16 0524 09/13/2016 1238 09/13/2016 1255 09/17/16 0214  WBC 5.3 6.1 12.3*  --  8.3  NEUTROABS  --   --  10.4*  --   --   HGB 12.2 10.7* 11.2* 12.6 10.6*  HCT 38.1 33.6* 35.3* 37.0 33.9*  MCV 89.6 90.1 89.1  --   89.7  PLT 207 217 231  --  357   Basic Metabolic Panel:  Recent Labs Lab 09/13/16 0511 09/14/16 0524 09/15/16 0518 10/04/2016 1230 09/21/2016 1238 09/29/2016 1255 09/17/16 0214  NA 141 141 140  --  138 137 140  K 4.1 2.8* 4.0  --  3.1* 3.1* 4.0  CL 104 98* 101  --  94* 92* 97*  CO2 28 33* 33*  --  33*  --  33*  GLUCOSE 139* 153* 154*  --  148* 142* 198*  BUN '17 20 19  '$ --  '10 14 11  '$ CREATININE 0.68 0.65 0.51  --  0.62 0.60 0.56  CALCIUM 8.4* 8.4* 8.4*  --  8.6*  --  8.4*  MG  --   --   --  1.9  --   --   --    GFR: Estimated Creatinine Clearance: 43.8 mL/min (by C-G formula based on SCr of 0.56 mg/dL).  Liver Function Tests:  Recent Labs Lab 09/11/16 0330 09/20/2016 1238 09/17/16 0214  AST 30 34 24  ALT 37 42 42  ALKPHOS 100 93 82  BILITOT 0.6 0.5 0.8  PROT 6.8 5.9* 5.4*  ALBUMIN 3.0* 3.0* 2.7*    Recent Results (from the past 240 hour(s))  Blood culture (routine x 2)     Status: None   Collection Time: 09/10/16  1:48 PM  Result Value Ref Range Status   Specimen Description BLOOD RIGHT ANTECUBITAL  Final   Special Requests BOTTLES DRAWN AEROBIC AND ANAEROBIC 5CC  Final   Culture   Final    NO GROWTH 5 DAYS Performed at [facility]    Report Status 09/15/2016 FINAL  Final  Blood culture (routine x 2)     Status: None   Collection Time: 09/10/16  4:13 PM  Result Value Ref Range Status   Specimen Description BLOOD RIGHT ANTECUBITAL  Final   Special Requests BOTTLES DRAWN AEROBIC AND ANAEROBIC 5CC  Final   Culture   Final    NO GROWTH 5 DAYS Performed at [facility]    Report Status 09/15/2016 FINAL  Final  MRSA PCR Screening     Status: None   Collection Time: 09/10/16  6:45 PM  Result Value Ref Range Status   MRSA by PCR NEGATIVE NEGATIVE Final    Comment:        The GeneXpert MRSA Assay (FDA approved for NASAL specimens only), is one component of a comprehensive MRSA colonization surveillance program. It is not intended to diagnose  MRSA infection nor to guide or monitor treatment for MRSA infections.   Urine culture     Status: Abnormal   Collection Time: 09/08/2016  2:45 PM  Result Value Ref Range Status   Specimen Description URINE, CLEAN CATCH  Final   Special Requests NONE  Final   Culture MULTIPLE SPECIES PRESENT, SUGGEST RECOLLECTION (A)  Final   Report Status 09/17/2016 FINAL  Final  MRSA PCR Screening     Status: None   Collection Time: 09/19/2016  7:10 PM  Result Value Ref Range Status   MRSA by PCR NEGATIVE NEGATIVE Final    Comment:  The GeneXpert MRSA Assay (FDA approved for NASAL specimens only), is one component of a comprehensive MRSA colonization surveillance program. It is not intended to diagnose MRSA infection nor to guide or monitor treatment for MRSA infections.      Scheduled Meds: . amLODipine  5 mg Oral Daily  . amoxicillin-clavulanate  1 tablet Oral Q12H  . aspirin EC  81 mg Oral Daily  . atorvastatin  10 mg Oral q1800  . enoxaparin (LOVENOX) injection  40 mg Subcutaneous Q24H  . feeding supplement (ENSURE ENLIVE)  237 mL Oral BID BM  . guaiFENesin  600 mg Oral BID  . levalbuterol  0.63 mg Nebulization Q6H  . methylPREDNISolone (SOLU-MEDROL) injection  60 mg Intravenous Q6H  . mometasone-formoterol  2 puff Inhalation BID  . multivitamin with minerals  1 tablet Oral Daily  . pantoprazole  40 mg Oral Daily  . potassium chloride  10 mEq Oral Daily  . sodium chloride flush  3 mL Intravenous Q12H  . tiotropium  18 mcg Inhalation Daily   Continuous Infusions: . sodium chloride 30 mL/hr at 09/29/2016 2054  . diltiazem (CARDIZEM) infusion 12.5 mg/hr (09/17/16 1307)     LOS: 0 days   Cherene Altes, MD [facility] Office  [phone] Pager - Text Page per Amion as per below:  On-Call/Text Page:      [email or web address]      password [email or web address]  If 7PM-7AM, please contact night-coverage [email or web address] Password [email or web address] 09/17/2016, 4:59 PM

## 2016-09-17 NOTE — Evaluation (Addendum)
Clinical/Bedside Swallow Evaluation Patient Details  Name: Carol [name] MRN: [id number] Date of Birth: 1932/10/21  Today's Date: 09/17/2016 Time: SLP Start Time (ACUTE ONLY): 0903 SLP Stop Time (ACUTE ONLY): 0929 SLP Time Calculation (min) (ACUTE ONLY): 26 min  Past Medical History:  Past Medical History:  Diagnosis Date  . Abdominal aortic aneurysm (AAA) ([place])   . Cataract   . COPD (chronic obstructive pulmonary disease) ([place])   . Coronary artery disease   . Encounter for antineoplastic chemotherapy 05/16/2016  . Fall   . GERD (gastroesophageal reflux disease)   . Hiatal hernia   . History of esophageal stricture   . Hypertension   . Lung cancer ([place])    squamous cell carcinoma LLL  . Osteoporosis   . Peripheral arterial disease ([place])    Past Surgical History:  Past Surgical History:  Procedure Laterality Date  . CATARACT EXTRACTION    . COLONOSCOPY    . ENDOBRONCHIAL ULTRASOUND Bilateral 03/28/2016   Procedure: ENDOBRONCHIAL ULTRASOUND;  Surgeon: [name], MD;  Location: [facility] ENDOSCOPY;  Service: Cardiopulmonary;  Laterality: Bilateral;  dr. Ashok Cordia insists on mac not general anesthesia  . ESOPHAGOGASTRODUODENOSCOPY (EGD) WITH ESOPHAGEAL DILATION    . esopheal dilation  02-26-16  . EYE SURGERY     HPI:  Carol [name] a 82 y.o.femalewith medical history significant for former tobacco abuse and associated oxygen dependent COPD, no non-all cell carcinoma with last radiation treatment July 2017, known CAD, known AAA, aortic stenosis with associated mild diastolic dysfunction, hypertension, atrial fibrillation noton anticoagulation, dyslipidemia, known esophageal stricture status post dilatation with persistent dysphagia. Patient was recently discharged on 10/12 after an admission for acute respiratory failure surmised related to [facility] likely related to radiation pneumonitis. Prior to last admission patient had been on steroids for radiation pneumonitis and had been  doing well until the steroids were tapered with resultant acute respiratory distress.    Assessment / Plan / Recommendation Clinical Impression  Pt presents with mild oropharyngeal dysphagia and hx of esophageal dysphagia s/p esophageal dilation. Pt with past objective swallow study (MBS in 2013) without incidence of aspiration. No overt s/sx of aspiraiton with any PO this date, though trials limited secondary to reports of labored breathing and pt not wanting PO. Saturated oxygen 90-91 during BSE which RT and RN are aware.  Recommend initiate dysphagia 3 (mechanical soft) and thin liquid diet with medicines crushed in puree (pt preference for medicines). Educated swallow strategies including pacing and positioning to reduce aspiraiton risk and conserve energy for maximal respiratory function during PO consumption. ST to continue to monitor.       Aspiration Risk  Mild aspiration risk;Risk for inadequate nutrition/hydration    Diet Recommendation   Dysphagia 3 (mechancial soft) Thin liquids  Medication Administration: Crushed with puree    Other  Recommendations Oral Care Recommendations: Oral care BID   Follow up Recommendations 24 hour supervision/assistance      Frequency and Duration min 1 x/week  1 week       Prognosis Prognosis for Safe Diet Advancement: Fair Barriers to Reach Goals: Severity of deficits      Swallow Study   General Date of Onset: 09/26/2016 HPI: Carol [name] a 82 y.o.femalewith medical history significant for former tobacco abuse and associated oxygen dependent COPD, no non-all cell carcinoma with last radiation treatment July 2017, known CAD, known AAA, aortic stenosis with associated mild diastolic dysfunction, hypertension, atrial fibrillation noton anticoagulation, dyslipidemia, known esophageal stricture status post dilatation with persistent dysphagia. Patient was  recently discharged on 10/12 after an admission for acute respiratory failure surmised  related to [facility] likely related to radiation pneumonitis. Prior to last admission patient had been on steroids for radiation pneumonitis and had been doing well until the steroids were tapered with resultant acute respiratory distress.  Type of Study: Bedside Swallow Evaluation Previous Swallow Assessment: MBS 09-11-16: Diet rec: reg thin, mild pharyngeal dysphagia  Diet Prior to this Study: NPO Temperature Spikes Noted: No Respiratory Status: Nasal cannula History of Recent Intubation: No Behavior/Cognition: Alert (restless) Oral Cavity Assessment: Dry Oral Care Completed by SLP: Yes Oral Cavity - Dentition: Dentures, top;Dentures, bottom Vision: Functional for self-feeding Self-Feeding Abilities: Able to feed self Patient Positioning: Upright in bed Baseline Vocal Quality: Low vocal intensity Volitional Swallow: Able to elicit    Oral/Motor/Sensory Function Overall Oral Motor/Sensory Function: Within functional limits   Ice Chips Ice chips: Not tested   Thin Liquid Thin Liquid: Within functional limits Presentation: Cup;Straw    Nectar Thick Nectar Thick Liquid: Not tested   Honey Thick Honey Thick Liquid: Not tested   Puree Puree: Within functional limits   Solid   GO   Solid: Not tested (pt refused solid PO)    Functional Assessment Tool Used: skilled observation Functional Limitations: Swallowing Swallow Current Status (H6861): At least 20 percent but less than 40 percent impaired, limited or restricted Swallow Goal Status [phone]): At least 20 percent but less than 40 percent impaired, limited or restricted   Arvil Chaco MA, [place] Speech Language Pathologist    Arvil Chaco E 09/17/2016,9:39 AM

## 2016-09-18 LAB — CBC
HEMATOCRIT: 32.2 % — AB (ref 36.0–46.0)
Hemoglobin: 10 g/dL — ABNORMAL LOW (ref 12.0–15.0)
MCH: 28.2 pg (ref 26.0–34.0)
MCHC: 31.1 g/dL (ref 30.0–36.0)
MCV: 90.7 fL (ref 78.0–100.0)
PLATELETS: 185 10*3/uL (ref 150–400)
RBC: 3.55 MIL/uL — AB (ref 3.87–5.11)
RDW: 18.7 % — ABNORMAL HIGH (ref 11.5–15.5)
WBC: 7.7 10*3/uL (ref 4.0–10.5)

## 2016-09-18 LAB — COMPREHENSIVE METABOLIC PANEL
ALBUMIN: 2.5 g/dL — AB (ref 3.5–5.0)
ALT: 36 U/L (ref 14–54)
AST: 22 U/L (ref 15–41)
Alkaline Phosphatase: 76 U/L (ref 38–126)
Anion gap: 9 (ref 5–15)
BUN: 12 mg/dL (ref 6–20)
CHLORIDE: 100 mmol/L — AB (ref 101–111)
CO2: 30 mmol/L (ref 22–32)
Calcium: 8.2 mg/dL — ABNORMAL LOW (ref 8.9–10.3)
Creatinine, Ser: 0.55 mg/dL (ref 0.44–1.00)
Glucose, Bld: 157 mg/dL — ABNORMAL HIGH (ref 65–99)
POTASSIUM: 3.6 mmol/L (ref 3.5–5.1)
SODIUM: 139 mmol/L (ref 135–145)
Total Bilirubin: 0.7 mg/dL (ref 0.3–1.2)
Total Protein: 5 g/dL — ABNORMAL LOW (ref 6.5–8.1)

## 2016-09-18 MED ORDER — DILTIAZEM HCL-DEXTROSE 100-5 MG/100ML-% IV SOLN (PREMIX)
5.0000 mg/h | INTRAVENOUS | Status: DC
  Administered 2016-09-18: 15 mg/h via INTRAVENOUS
  Administered 2016-09-19: 7.5 mg/h via INTRAVENOUS
  Administered 2016-09-19: 15 mg/h via INTRAVENOUS
  Administered 2016-09-19 – 2016-09-21 (×5): 7.5 mg/h via INTRAVENOUS
  Filled 2016-09-18 (×9): qty 100

## 2016-09-18 MED ORDER — LORAZEPAM 2 MG/ML IJ SOLN
0.5000 mg | Freq: Four times a day (QID) | INTRAMUSCULAR | Status: DC | PRN
  Administered 2016-09-18 – 2016-09-23 (×2): 0.5 mg via INTRAVENOUS
  Filled 2016-09-18 (×2): qty 1

## 2016-09-18 NOTE — Progress Notes (Signed)
[facility] TEAM 1 - Stepdown/ICU TEAM  Magenta Schmiesing  ION:[id number] DOB: 12-09-1931 DOA: 09/09/2016 PCP: [name], DO    Brief Narrative:  82 y.o. female with history of oxygen dependent COPD, non-small-cell lung cancer with last radiation treatment July 2017, CAD, AAA, aortic stenosis with associated mild diastolic dysfunction, hypertension, atrial fibrillation not on anticoagulation, dyslipidemia, and known esophageal stricture status post dilatation with persistent dysphagia who was discharged on 10/12 after an admission for acute respiratory failure related to HCAP v/s radiation pneumonitis. Prior to this last admission patient had been on steroids for radiation pneumonitis and had been doing well until the steroids were tapered with resultant acute respiratory distress. At time of discharge patient was prescribed prednisone 40 mg daily as well as Augmentin. Patient received appropriate medication doses prior to leaving the hospital but had yet to have prescriptions filled when she developed acute onset of respiratory distress.  She has previously been advised to pursue SNF placement, but she refused.  She admits that she probably should've gone to the skilled nursing facility as recommended and at this time is amenable to discharging to skilled nursing facility once medically stable.  Significant Events: 10/13 readmission to the hospital  Subjective: The patient has required an up titration to Venturi mask today.  She is not in acute distress at the present but continues to require a higher level of oxygen support.  Assessment & Plan:  Acute hypoxic respiratory failure due to radiation pneumonitis +/- HCAP Cont steroids as prescribed at time of d/c - now off abx - f/u CXR in AM   Chronic Atrial fibrillation with acute RVR CHADsVASC score 3 - not an anticoagulation candidate given age and fall risk - rate currently back up/uncontrolled - resume IV cardizem until oral intake more  consistent and hypoxia improves   Aortic stenosis Not clinically significant at this time  Mild diastolic dysfunction Appears euvolemic at present - baseline weight appears to be approximately 50 kg Filed Weights   09/29/2016 1830 09/17/16 0500 09/18/16 0411  Weight: 52.3 kg (115 lb 4.8 oz) 50.3 kg (110 lb 12.8 oz) 49.8 kg (109 lb 11.2 oz)    Hypertension Recently controlled at present time  Non-small cell lung cancer of left lung patient is status post chemotherapy and radiation treatment - follow up w/ Oncology as outpatient  Dysphagia - History of esophageal stricture status post dilation, no odynophagia at this time - continue PPI - follow w/ intake   Moderate protein calorie malnutrition  Hypokalemia Replaced   DVT prophylaxis: Lovenox Code Status: DNR - NO CODE Family Communication: no family present at time of exam  Disposition Plan: SDU - for ultimate SNF placement  Consultants:  none  Antimicrobials:  Augmentin 10/10 > 10/13  Objective: Blood pressure (!) 129/96, pulse 80, temperature 99.1 F (37.3 C), temperature source Axillary, resp. rate (!) 24, height 5' 3.6" (1.615 m), weight 49.8 kg (109 lb 11.2 oz), SpO2 94 %.  Intake/Output Summary (Last 24 hours) at 09/18/16 1715 Last data filed at 09/18/16 1600  Gross per 24 hour  Intake          1586.33 ml  Output              300 ml  Net          1286.33 ml   Filed Weights   09/23/2016 1830 09/17/16 0500 09/18/16 0411  Weight: 52.3 kg (115 lb 4.8 oz) 50.3 kg (110 lb 12.8 oz) 49.8 kg (109 lb 11.2  oz)    Examination: General: requiring venturi face mask O2 support  Lungs: Poor air movement diffusely - no focal crackles - no active wheeze Cardiovascular: Irregularly irregular with tachycardia without appreciable murmur Abdomen: Nondistended, soft, bowel sounds positive, no rebound Extremities: No significant cyanosis, clubbing, edema bilateral lower extremities  CBC:  Recent Labs Lab 09/14/16 0524  09/22/2016 1238 09/25/2016 1255 09/17/16 0214 09/18/16 0318  WBC 6.1 12.3*  --  8.3 7.7  NEUTROABS  --  10.4*  --   --   --   HGB 10.7* 11.2* 12.6 10.6* 10.0*  HCT 33.6* 35.3* 37.0 33.9* 32.2*  MCV 90.1 89.1  --  89.7 90.7  PLT 217 231  --  210 993   Basic Metabolic Panel:  Recent Labs Lab 09/14/16 0524 09/15/16 0518 09/06/2016 1230 09/06/2016 1238 09/19/2016 1255 09/17/16 0214 09/18/16 0318  NA 141 140  --  138 137 140 139  K 2.8* 4.0  --  3.1* 3.1* 4.0 3.6  CL 98* 101  --  94* 92* 97* 100*  CO2 33* 33*  --  33*  --  33* 30  GLUCOSE 153* 154*  --  148* 142* 198* 157*  BUN 20 19  --  '10 14 11 12  '$ CREATININE 0.65 0.51  --  0.62 0.60 0.56 0.55  CALCIUM 8.4* 8.4*  --  8.6*  --  8.4* 8.2*  MG  --   --  1.9  --   --   --   --    GFR: Estimated Creatinine Clearance: 43.4 mL/min (by C-G formula based on SCr of 0.55 mg/dL).  Liver Function Tests:  Recent Labs Lab 09/30/2016 1238 09/17/16 0214 09/18/16 0318  AST 34 24 22  ALT 42 42 36  ALKPHOS 93 82 76  BILITOT 0.5 0.8 0.7  PROT 5.9* 5.4* 5.0*  ALBUMIN 3.0* 2.7* 2.5*    Recent Results (from the past 240 hour(s))  Blood culture (routine x 2)     Status: None   Collection Time: 09/10/16  1:48 PM  Result Value Ref Range Status   Specimen Description BLOOD RIGHT ANTECUBITAL  Final   Special Requests BOTTLES DRAWN AEROBIC AND ANAEROBIC 5CC  Final   Culture   Final    NO GROWTH 5 DAYS Performed at [facility]    Report Status 09/15/2016 FINAL  Final  Blood culture (routine x 2)     Status: None   Collection Time: 09/10/16  4:13 PM  Result Value Ref Range Status   Specimen Description BLOOD RIGHT ANTECUBITAL  Final   Special Requests BOTTLES DRAWN AEROBIC AND ANAEROBIC 5CC  Final   Culture   Final    NO GROWTH 5 DAYS Performed at [facility]    Report Status 09/15/2016 FINAL  Final  MRSA PCR Screening     Status: None   Collection Time: 09/10/16  6:45 PM  Result Value Ref Range Status   MRSA by PCR  NEGATIVE NEGATIVE Final    Comment:        The GeneXpert MRSA Assay (FDA approved for NASAL specimens only), is one component of a comprehensive MRSA colonization surveillance program. It is not intended to diagnose MRSA infection nor to guide or monitor treatment for MRSA infections.   Urine culture     Status: Abnormal   Collection Time: 09/30/2016  2:45 PM  Result Value Ref Range Status   Specimen Description URINE, CLEAN CATCH  Final   Special Requests NONE  Final  Culture MULTIPLE SPECIES PRESENT, SUGGEST RECOLLECTION (A)  Final   Report Status 09/17/2016 FINAL  Final  MRSA PCR Screening     Status: None   Collection Time: 09/10/2016  7:10 PM  Result Value Ref Range Status   MRSA by PCR NEGATIVE NEGATIVE Final    Comment:        The GeneXpert MRSA Assay (FDA approved for NASAL specimens only), is one component of a comprehensive MRSA colonization surveillance program. It is not intended to diagnose MRSA infection nor to guide or monitor treatment for MRSA infections.      Scheduled Meds: . aspirin EC  81 mg Oral Daily  . diltiazem  60 mg Oral Q8H  . enoxaparin (LOVENOX) injection  40 mg Subcutaneous Q24H  . feeding supplement (ENSURE ENLIVE)  237 mL Oral BID BM  . methylPREDNISolone (SOLU-MEDROL) injection  60 mg Intravenous Q6H  . mometasone-formoterol  2 puff Inhalation BID  . pantoprazole  40 mg Oral Daily  . sodium chloride flush  3 mL Intravenous Q12H  . tiotropium  18 mcg Inhalation Daily   Continuous Infusions: . sodium chloride 50 mL/hr at 09/18/16 1124     LOS: 1 day   Cherene Altes, MD [facility] Office  [phone] Pager - Text Page per Shea Evans as per below:  On-Call/Text Page:      [email or web address]      password [email or web address]  If 7PM-7AM, please contact night-coverage [email or web address] Password [email or web address] 09/18/2016, 5:15 PM

## 2016-09-19 ENCOUNTER — Telehealth: Payer: Self-pay | Admitting: Pulmonary Disease

## 2016-09-19 ENCOUNTER — Inpatient Hospital Stay (HOSPITAL_COMMUNITY): Payer: Medicare Other

## 2016-09-19 DIAGNOSIS — J810 Acute pulmonary edema: Secondary | ICD-10-CM

## 2016-09-19 DIAGNOSIS — I10 Essential (primary) hypertension: Secondary | ICD-10-CM

## 2016-09-19 DIAGNOSIS — Z8719 Personal history of other diseases of the digestive system: Secondary | ICD-10-CM

## 2016-09-19 DIAGNOSIS — J432 Centrilobular emphysema: Secondary | ICD-10-CM

## 2016-09-19 DIAGNOSIS — J9621 Acute and chronic respiratory failure with hypoxia: Secondary | ICD-10-CM

## 2016-09-19 DIAGNOSIS — I350 Nonrheumatic aortic (valve) stenosis: Secondary | ICD-10-CM

## 2016-09-19 MED ORDER — ARFORMOTEROL TARTRATE 15 MCG/2ML IN NEBU
15.0000 ug | INHALATION_SOLUTION | Freq: Two times a day (BID) | RESPIRATORY_TRACT | Status: DC
  Administered 2016-09-19 – 2016-09-24 (×9): 15 ug via RESPIRATORY_TRACT
  Filled 2016-09-19 (×10): qty 2

## 2016-09-19 MED ORDER — BUDESONIDE 0.5 MG/2ML IN SUSP
0.5000 mg | Freq: Two times a day (BID) | RESPIRATORY_TRACT | Status: DC
  Administered 2016-09-19 – 2016-09-24 (×9): 0.5 mg via RESPIRATORY_TRACT
  Filled 2016-09-19 (×10): qty 2

## 2016-09-19 MED ORDER — METHYLPREDNISOLONE SODIUM SUCC 125 MG IJ SOLR
60.0000 mg | Freq: Two times a day (BID) | INTRAMUSCULAR | Status: DC
  Administered 2016-09-19 – 2016-09-24 (×10): 60 mg via INTRAVENOUS
  Filled 2016-09-19 (×10): qty 2

## 2016-09-19 MED ORDER — FUROSEMIDE 10 MG/ML IJ SOLN
40.0000 mg | Freq: Once | INTRAMUSCULAR | Status: AC
  Administered 2016-09-19: 40 mg via INTRAVENOUS
  Filled 2016-09-19: qty 4

## 2016-09-19 NOTE — Progress Notes (Signed)
Patient ID: Carol [name], female   DOB: Nov 23, 1932, 82 y.o.   MRN: [id number]  PROGRESS NOTE    Stormy Connon  TOI:[id number] DOB: [birth year] DOA: 09/27/2016  PCP: [name], DO   Brief Narrative:  82 y.o.femalewith past medical history significant for COPD, oxygen dependent, non-small cell lung cancer with last radiation treatment in July 2017, coronary artery disease, aortic stenosis with associated diastolic dysfunction, hypertension, atrial fibrillation but not on anticoagulation, dyslipidemia, esophageal stricture status post dilation with persistent dysphasia. Patient was hospitalized and subsequently discharged on 09/15/2016 for acute respiratory failure due to healthcare associated pneumonia versus radiation pneumonitis. Patient required steroids during that time and was sent home with prednisone 40 mg daily as well as Augmentin. She developed respiratory distress and ultimately came back to hospital 10/03/2016. Please note that during the prior hospitalization it was recommended that she goes to skilled nursing facility but patient declined placement at that time.    Assessment & Plan:   Acute hypoxic respiratory failure with hypoxia due to radiation pneumonitis +/- HCAP - Patient is on a Ventimask this morning - We will continue current inhalers, Dulera and Spiriva - Continue Solu-Medrol 60 mg IV every 6 hours - Chest x-ray this morning showed atelectasis in the left upper lobe region, interval clearing of atelectatic change from more inferiorly on the left, diffuse interstitial prominence bilaterally, no new opacities noted  Chronic Atrial fibrillation with acute RVR - CHADsVASC score 3  - Not an anticoagulation candidate given age and fall risk - Heart rate controlled with Cardizem drip  Aortic stenosis - Not clinically significant at this time - Continue daily aspirin  Chronic diastolic CHF  - Appears euvolemic, compensated   Essential hypertension  - Blood  pressure 161/106  Non-small cell lung cancer of left lung - Patient is status post chemotherapy and radiation treatment - Follow up w/ Oncology as outpatient  Dysphagia - History of esophageal stricture - Status post dilation, no odynophagia at this time  - Continue Protonix - Continue nutritional supplementation   Moderate protein calorie malnutrition - In the context of chronic illness - Continue nutritional supplementation   Hypokalemia - Supplemented and within normal limits    DVT prophylaxis: Lovenox subQ  Code Status: DNR/DNI  Family Communication: no family at the bedside this am Disposition Plan: per PT needs 24 hours supervision   Consultants:   PT  Procedures:   None   Antimicrobials:   None on this admission     Subjective: No overnight events.   Objective: Vitals:   09/19/16 0322 09/19/16 0750 09/19/16 0753 09/19/16 0755  BP:  (!) 165/95 (!) 161/106   Pulse:   72   Resp:   14   Temp:   (!) 96.6 F (35.9 C)   TempSrc:   Oral   SpO2:    93%  Weight: 50.1 kg (110 lb 6.4 oz) 50.1 kg (110 lb 8 oz)    Height:        Intake/Output Summary (Last 24 hours) at 09/19/16 0954 Last data filed at 09/19/16 0750  Gross per 24 hour  Intake          1276.21 ml  Output              200 ml  Net          1076.21 ml   Filed Weights   09/18/16 0411 09/19/16 0322 09/19/16 0750  Weight: 49.8 kg (109 lb 11.2 oz) 50.1 kg (110 lb 6.4 oz) 50.1  kg (110 lb 8 oz)    Examination:  General exam: Appears calm and comfortable  Respiratory system: Diminished breath sounds, coarse breath sounds . Cardiovascular system: S1 & S2 heard, Rate controlled  Gastrointestinal system: Abdomen is nondistended, soft and nontender. No organomegaly or masses felt. Normal bowel sounds heard. Central nervous system: Alert and oriented. No focal neurological deficits. Extremities: Symmetric 5 x 5 power. No edema Skin: No rashes, lesions or ulcers Psychiatry: Judgement and  insight appear normal. Mood & affect appropriate.   Data Reviewed: I have personally reviewed following labs and imaging studies  CBC:  Recent Labs Lab 09/14/16 0524 09/22/2016 1238 09/08/2016 1255 09/17/16 0214 09/18/16 0318  WBC 6.1 12.3*  --  8.3 7.7  NEUTROABS  --  10.4*  --   --   --   HGB 10.7* 11.2* 12.6 10.6* 10.0*  HCT 33.6* 35.3* 37.0 33.9* 32.2*  MCV 90.1 89.1  --  89.7 90.7  PLT 217 231  --  210 426   Basic Metabolic Panel:  Recent Labs Lab 09/14/16 0524 09/15/16 0518 10/01/2016 1230 09/09/2016 1238 10/02/2016 1255 09/17/16 0214 09/18/16 0318  NA 141 140  --  138 137 140 139  K 2.8* 4.0  --  3.1* 3.1* 4.0 3.6  CL 98* 101  --  94* 92* 97* 100*  CO2 33* 33*  --  33*  --  33* 30  GLUCOSE 153* 154*  --  148* 142* 198* 157*  BUN 20 19  --  '10 14 11 12  '$ CREATININE 0.65 0.51  --  0.62 0.60 0.56 0.55  CALCIUM 8.4* 8.4*  --  8.6*  --  8.4* 8.2*  MG  --   --  1.9  --   --   --   --    GFR: Estimated Creatinine Clearance: 43.6 mL/min (by C-G formula based on SCr of 0.55 mg/dL). Liver Function Tests:  Recent Labs Lab 09/18/2016 1238 09/17/16 0214 09/18/16 0318  AST 34 24 22  ALT 42 42 36  ALKPHOS 93 82 76  BILITOT 0.5 0.8 0.7  PROT 5.9* 5.4* 5.0*  ALBUMIN 3.0* 2.7* 2.5*   No results for input(s): LIPASE, AMYLASE in the last 168 hours. No results for input(s): AMMONIA in the last 168 hours. Coagulation Profile: No results for input(s): INR, PROTIME in the last 168 hours. Cardiac Enzymes: No results for input(s): CKTOTAL, CKMB, CKMBINDEX, TROPONINI in the last 168 hours. BNP (last 3 results)  Recent Labs  08/01/16 1048  PROBNP 106.0*   HbA1C: No results for input(s): HGBA1C in the last 72 hours. CBG: No results for input(s): GLUCAP in the last 168 hours. Lipid Profile: No results for input(s): CHOL, HDL, LDLCALC, TRIG, CHOLHDL, LDLDIRECT in the last 72 hours. Thyroid Function Tests: No results for input(s): TSH, T4TOTAL, FREET4, T3FREE, THYROIDAB in the  last 72 hours. Anemia Panel: No results for input(s): VITAMINB12, FOLATE, FERRITIN, TIBC, IRON, RETICCTPCT in the last 72 hours. Urine analysis:    Component Value Date/Time   COLORURINE YELLOW 10/02/2016 1445   APPEARANCEUR HAZY (A) 09/17/2016 1445   LABSPEC 1.012 09/28/2016 1445   PHURINE 8.0 09/06/2016 1445   GLUCOSEU NEGATIVE 09/25/2016 1445   HGBUR NEGATIVE 09/12/2016 [place] 09/28/2016 1445   KETONESUR NEGATIVE 09/12/2016 1445   PROTEINUR >300 (A) 10/01/2016 1445   NITRITE NEGATIVE 10/04/2016 1445   LEUKOCYTESUR NEGATIVE 09/06/2016 1445   Sepsis Labs: '@LABRCNTIP'$ (procalcitonin:4,lacticidven:4)    Blood culture (routine x 2)  Status: None   Collection Time: 09/10/16  1:48 PM  Result Value Ref Range Status   Specimen Description BLOOD RIGHT ANTECUBITAL  Final   Special Requests BOTTLES DRAWN AEROBIC AND ANAEROBIC 5CC  Final   Culture   Final    NO GROWTH 5 DAYS Performed at [facility]    Report Status 09/15/2016 FINAL  Final  Blood culture (routine x 2)     Status: None   Collection Time: 09/10/16  4:13 PM  Result Value Ref Range Status   Specimen Description BLOOD RIGHT ANTECUBITAL  Final   Special Requests BOTTLES DRAWN AEROBIC AND ANAEROBIC 5CC  Final   Culture   Final    NO GROWTH 5 DAYS Performed at [facility]    Report Status 09/15/2016 FINAL  Final  MRSA PCR Screening     Status: None   Collection Time: 09/10/16  6:45 PM  Result Value Ref Range Status   MRSA by PCR NEGATIVE NEGATIVE Final  Urine culture     Status: Abnormal   Collection Time: 09/14/2016  2:45 PM  Result Value Ref Range Status   Specimen Description URINE, CLEAN CATCH  Final   Special Requests NONE  Final   Culture MULTIPLE SPECIES PRESENT, SUGGEST RECOLLECTION (A)  Final   Report Status 09/17/2016 FINAL  Final  MRSA PCR Screening     Status: None   Collection Time: 09/11/2016  7:10 PM  Result Value Ref Range Status   MRSA by PCR NEGATIVE NEGATIVE  Final      Radiology Studies: Dg Chest Port 1 View Result Date: 09/19/2016 Scarring and atelectasis in the left upper lobe region remains. There has been interval clearing of atelectatic change from more inferiorly on the left. Diffuse interstitial prominence bilaterally remains, possibly representing a degree of chronic congestive heart failure or residua of chronic inflammatory type change superimposed on emphysema. No new opacity evident. Stable cardiac silhouette. There is aortic atherosclerosis. Electronically Signed   By: Lowella Grip III M.D.   On: 09/19/2016 07:28   Dg Chest Port 1 View Result Date: 10/03/2016 1. Stable appearance the chest with left hilar and perihilar densities likely primarily from scarring, and atherosclerosis of the thoracic aorta. 2. Emphysema.     Scheduled Meds: . aspirin EC  81 mg Oral Daily  . enoxaparin (LOVENOX) injection  40 mg Subcutaneous Q24H  . feeding supplement (ENSURE ENLIVE)  237 mL Oral BID BM  . methylPREDNISolone (SOLU-MEDROL) injection  60 mg Intravenous Q6H  . mometasone-formoterol  2 puff Inhalation BID  . pantoprazole  40 mg Oral Daily  . sodium chloride flush  3 mL Intravenous Q12H  . tiotropium  18 mcg Inhalation Daily   Continuous Infusions: . sodium chloride 30 mL/hr at 09/18/16 1831  . diltiazem (CARDIZEM) infusion 7.5 mg/hr (09/19/16 0806)     LOS: 2 days    Time spent: 25 minutes  Greater than 50% of the time spent on counseling and coordinating the care.   Leisa Lenz, MD [facility] Pager [phone]  If 7PM-7AM, please contact night-coverage [email or web address] Password [email or web address] 09/19/2016, 9:54 AM

## 2016-09-19 NOTE — Consult Note (Deleted)
Name: Carol [name] MRN: [id number] DOB: 10/01/1932    ADMISSION DATE:  09/12/2016 CONSULTATION DATE:  10/16  REFERRING MD :  [name]  CHIEF COMPLAINT:  Acute on chronic respiratory failure   BRIEF PATIENT DESCRIPTION:  82 yo female former smoker presented with dyspnea, chest pain, and weakness for about 3 weeks. She was also noted to have SpO2 84% on RA. She has hx of NSCLC lung cancer and had last XRT/chemo in July 2017. She was dx'ed with radiation pneumonitis and started on prednisone >>her symptoms progressed once her prednisone dose was decreased to 20 mg. There was concern for HCAP and she was started on antibiotics. She is followed by Dr. Ashok Cordia in pulmonary office for COPD. She was discharged to home on 10/12, subsequently readmitted the following day. Since admit on 10/13 has increasing FIO2 requirements.   SIGNIFICANT EVENTS  10/12 discharged on pred/augmentin 10/13 readmitted  10/16 PCCM called to see re: worsening resp failure   STUDIES:     HISTORY OF PRESENT ILLNESS:   82 y.o.femalewith past medical history significant for COPD, oxygen dependent, non-small cell lung cancer with last radiation treatment in July 2017, coronary artery disease, aortic stenosis with associated diastolic dysfunction, hypertension, atrial fibrillation but not on anticoagulation, dyslipidemia, esophageal stricture status post dilation with persistent dysphasia. Patient was hospitalized and subsequently discharged on 09/15/2016 for acute respiratory failure due to healthcare associated pneumonia versus radiation pneumonitis. She was sent home with prednisone 40 mg daily as well as Augmentin. She was to f/u with our office on 10/18.  She developed respiratory distress and ultimately came back to hospital 09/26/2016. Since admit she has required escalating oxygen and has had worsening respiratory failure so PCCM was asked to see.    PAST MEDICAL HISTORY :   has a past medical history of  Abdominal aortic aneurysm (AAA) ([place]); Cataract; COPD (chronic obstructive pulmonary disease) ([place]); Coronary artery disease; Encounter for antineoplastic chemotherapy (05/16/2016); Fall; GERD (gastroesophageal reflux disease); Hiatal hernia; History of esophageal stricture; Hypertension; Lung cancer ([place]); Osteoporosis; and Peripheral arterial disease ([place]).  has a past surgical history that includes Eye surgery; Colonoscopy; Esophagogastroduodenoscopy (egd) with esophageal dilation; Cataract extraction; esopheal dilation (02-26-16); and Endobronchial ultrasound (Bilateral, 03/28/2016). Prior to Admission medications   Medication Sig Start Date End Date Taking? Authorizing Provider  albuterol (PROVENTIL HFA) 108 (90 Base) MCG/ACT inhaler Inhale 2 puffs into the lungs 2 (two) times daily as needed for shortness of breath. 02/10/16 02/09/17 Yes Renee A Kuneff, DO  amLODipine (NORVASC) 5 MG tablet Take 1 tablet (5 mg total) by mouth daily. Patient taking differently: Take 5 mg by mouth every morning.  07/05/16 07/05/17 Yes Renee A Kuneff, DO  aspirin EC 81 MG tablet Take 81 mg by mouth every morning.    Yes Historical Provider, MD  atorvastatin (LIPITOR) 10 MG tablet Take 1 tablet (10 mg total) by mouth daily at 6 PM. Reported on 01/29/2016 02/19/16  Yes Minus Breeding, MD  budesonide-formoterol [facility]) 160-4.5 MCG/ACT inhaler Inhale 2 puffs into the lungs 2 (two) times daily. 08/01/16  Yes Tammy S Parrett, NP  diltiazem (CARDIZEM) 60 MG tablet Take 1 tablet (60 mg total) by mouth 3 (three) times daily. 09/15/16  Yes Clanford Marisa Hua, MD  feeding supplement, ENSURE ENLIVE, (ENSURE ENLIVE) LIQD Take 237 mLs by mouth 2 (two) times daily between meals. 09/15/16  Yes Clanford Marisa Hua, MD  furosemide (LASIX) 20 MG tablet Take 1 tablet (20 mg total) by mouth daily. 09/25/2016  Yes Clanford  Marisa Hua, MD  guaiFENesin (MUCINEX) 600 MG 12 hr tablet Take 1 tablet (600 mg total) by mouth 2 (two) times daily. 09/15/16  Yes  Clanford Marisa Hua, MD  Multiple Vitamin (MULTIVITAMIN WITH MINERALS) TABS tablet Take 1 tablet by mouth daily. 09/27/2016  Yes Clanford Marisa Hua, MD  omeprazole (PRILOSEC) 40 MG capsule Take 1 capsule (40 mg total) by mouth 2 (two) times daily. 07/13/16  Yes Levin Erp, PA  potassium chloride (K-DUR) 10 MEQ tablet Take 1 tablet (10 mEq total) by mouth daily. 09/15/16  Yes Clanford Marisa Hua, MD  predniSONE (DELTASONE) 20 MG tablet Take 2 tablets (40 mg total) by mouth daily with breakfast. 09/15/16  Yes Clanford L Johnson, MD  Sennosides (EX-LAX) 15 MG TABS Take 15 mg by mouth daily as needed (For constipation.).    Yes Historical Provider, MD  Tiotropium Bromide Monohydrate (SPIRIVA RESPIMAT) 2.5 MCG/ACT AERS Inhale 2 puffs into the lungs daily. Patient taking differently: Inhale 2 puffs into the lungs every morning.  08/16/16  Yes [name], MD   No Known Allergies  FAMILY HISTORY:  family history includes Alcohol abuse in her father; Breast cancer (age of onset: 55) in her mother; Early death in her brother; Emphysema in her father; Hypertension in her paternal grandmother. SOCIAL HISTORY:  reports that she quit smoking about 7 months ago. Her smoking use included Cigarettes. She has a 90.00 pack-year smoking history. She has never used smokeless tobacco. She reports that she drinks alcohol. She reports that she does not use drugs.  REVIEW OF SYSTEMS:   Review of Systems  Constitutional: Positive for malaise/fatigue and weight loss. Negative for chills, diaphoresis and fever.  HENT: Negative for congestion, hearing loss, nosebleeds and sore throat.   Eyes: Negative.   Respiratory: Positive for cough and shortness of breath. Negative for hemoptysis and sputum production.   Cardiovascular: Positive for chest pain and palpitations. Negative for orthopnea, claudication and leg swelling.  Gastrointestinal: Positive for heartburn. Negative for nausea and vomiting.  Genitourinary:  Negative.   Musculoskeletal: Negative.   Skin: Negative.   Neurological: Positive for weakness. Negative for headaches.  Endo/Heme/Allergies: Negative.   Psychiatric/Behavioral: Positive for memory loss.   SUBJECTIVE:  Per above  VITAL SIGNS: Temp:  [96.6 F (35.9 C)-99.1 F (37.3 C)] 97.9 F (36.6 C) (10/16 1204) Pulse Rate:  [39-86] 72 (10/16 0753) Resp:  [8-23] 14 (10/16 0753) BP: (120-165)/(76-106) 155/76 (10/16 1204) SpO2:  [92 %-100 %] 93 % (10/16 0755) FiO2 (%):  [40 %] 40 % (10/16 0755) Weight:  [110 lb 6.4 oz (50.1 kg)-110 lb 8 oz (50.1 kg)] 110 lb 8 oz (50.1 kg) (10/16 0750) Filed Weights   09/18/16 0411 09/19/16 0322 09/19/16 0750  Weight: 109 lb 11.2 oz (49.8 kg) 110 lb 6.4 oz (50.1 kg) 110 lb 8 oz (50.1 kg)    Intake/Output Summary (Last 24 hours) at 09/19/16 1334 Last data filed at 09/19/16 1148  Gross per 24 hour  Intake           992.96 ml  Output              600 ml  Net           392.96 ml   PHYSICAL EXAMINATION: General:  Frail elderly white female, currently sitting up in bed. Has significant increased work of breathing.  Neuro:  Awake, alert, her memory is poor. She has generalized weakness  HEENT:  NCAT. Her tongue is dry. Neck veins are  flat  Cardiovascular:  RRR, no MRG Lungs:  No wheeze, dry crackles bilaterally + accessory use  Abdomen:  Soft, not tender. + bowel sounds Musculoskeletal:  Equal st and bulk  Skin:  Dry, scattered areas of ecchymosis    Recent Labs Lab 09/14/2016 1238 09/15/2016 1255 09/17/16 0214 09/18/16 0318  NA 138 137 140 139  K 3.1* 3.1* 4.0 3.6  CL 94* 92* 97* 100*  CO2 33*  --  33* 30  BUN '10 14 11 12  '$ CREATININE 0.62 0.60 0.56 0.55  GLUCOSE 148* 142* 198* 157*    Recent Labs Lab 09/05/2016 1238 09/20/2016 1255 09/17/16 0214 09/18/16 0318  HGB 11.2* 12.6 10.6* 10.0*  HCT 35.3* 37.0 33.9* 32.2*  WBC 12.3*  --  8.3 7.7  PLT 231  --  210 185   Dg Chest Port 1 View  Result Date: 09/19/2016 CLINICAL DATA:   Shortness of breath. History of radiation pneumonitis EXAM: PORTABLE CHEST 1 VIEW COMPARISON:  Chest CT September 10, 2016; chest CT September 19, 2016 FINDINGS: There is a degree of underlying emphysematous change, better demonstrated on recent CT examination. Linear scarring and atelectasis remains in the left upper lobe region. There has been interval clearing of linear atelectasis in the left lower lobe region compared to most recent of radiographic examination. There is diffuse interstitial prominence throughout the lungs bilaterally, stable from recent studies. No new opacity is evident. Heart size and pulmonary vascularity are within normal limits. There is atherosclerotic calcification in the aorta. No adenopathy. IMPRESSION: Scarring and atelectasis in the left upper lobe region remains. There has been interval clearing of atelectatic change from more inferiorly on the left. Diffuse interstitial prominence bilaterally remains, possibly representing a degree of chronic congestive heart failure or residua of chronic inflammatory type change superimposed on emphysema. No new opacity evident. Stable cardiac silhouette. There is aortic atherosclerosis. Electronically Signed   By: Lowella Grip III M.D.   On: 09/19/2016 07:28  PCXR LUL ATX. Probable element of edema superimposed on underlying airspace disease.   ASSESSMENT / PLAN:  Stage IIIA NSCLC Radiation pneumonitis w/ recurrent flare  Severe COPD Chronic respiratory failure Dysphagia  H/o esophageal stricture  afib Acute on chronic diastolic dysfxn Aortic stenosis Essential HTN  Deconditioning  Protein calorie malnutrition    Discussion  Acute on Chronic Hypoxic Respiratory Failure in the setting of what is likely element of mild pulmonary edema; superimposed on recent radiation pneumonitis +/- HCAP and AECOPD. I am also concerned that she may be intermittently aspirating given witnessed cough w/ thin liquids. This may just situational  however given her O2 needs and work of breathing. There is really not much more to offer here. She is back on higher dosing of steroids, scheduled BDs, and completed abx. There may be a small element of pulmonary edema at this point but think another large factor here is simply her deconditioning and weakness.  Plan Cont systemic steroids Change BD regimen to nebulized  Change Oxygen to heated high-flow. This may help prevent desaturations as she is frequently removing her mask 1 time lasix (although I agree that it looks like she is probably on the dry/euvolemia [name]) Aspiration precautions PRN morphine We may be rapidly approaching hospice/palliative oriented care.   Addendum:  I updated her Son Carol [name]. He is very insightful and knows that his mother is declining. He asked me if we are "at hospice yet". I told him I think that we are very close and that she is  declaring this physically as we speak.. As noted above there is little we can offer other than what I outlined. If she doesn't improve in the next 24-48hrs OR if she declines I would fully support transition to palliative focus.    Erick Colace ACNP-BC [place] Pager # [phone] OR # [phone] if no answer   09/19/2016, 1:04 PM

## 2016-09-19 NOTE — Plan of Care (Signed)
Problem: Bowel/Gastric: Goal: Will not experience complications related to bowel motility Outcome: Completed/Met Date Met: 09/19/16 EPIC flagged patient record d/t not having BM documented. Patient stated her last BM was 09/17/16.

## 2016-09-19 NOTE — Telephone Encounter (Signed)
He needs to speak with attending physician to discuss whether pulmonary consultation is needed.  Also Dr. Corrie Dandy is doing AM consults today.

## 2016-09-19 NOTE — Telephone Encounter (Signed)
Spoke with [name]. He is aware of VS recommendation. Nothing further was needed.

## 2016-09-19 NOTE — Telephone Encounter (Signed)
Spoke with pt's son-in-law, [name]. He states that they are wanting to speak to [facility]. Pt is currently admitted at [facility], they are thinking that she will not be leaving the hospital this time. Advised [name] that Durene Cal is not working until later this afternoon. [name] would like someone to call him or come by and see the pt. VS is the doc at [facility] today.  VS - would you mind calling this pt's son-in-law? Thanks.

## 2016-09-19 NOTE — Consult Note (Signed)
Name: Carol [name] MRN: [id number] DOB: [birth year]    ADMISSION DATE:  09/12/2016 CONSULTATION DATE:  10/16  REFERRING MD :  [name]  CHIEF COMPLAINT:  Acute on chronic respiratory failure   BRIEF PATIENT DESCRIPTION:  82 yo female former smoker presented with dyspnea, chest pain, and weakness for about 3 weeks. She was also noted to have SpO2 84% on RA. She has hx of NSCLC lung cancer and had last XRT/chemo in July 2017. She was dx'ed with radiation pneumonitis and started on prednisone >>her symptoms progressed once her prednisone dose was decreased to 20 mg. There was concern for HCAP and she was started on antibiotics. She is followed by Dr. Ashok Cordia in pulmonary office for COPD. She was discharged to home on 10/12, subsequently readmitted the following day. Since admit on 10/13 has increasing FIO2 requirements.   SIGNIFICANT EVENTS  10/12 discharged on pred/augmentin 10/13 readmitted  10/16 PCCM called to see re: worsening resp failure   STUDIES:    HISTORY OF PRESENT ILLNESS:   82 y.o.femalewith past medical history significant for COPD, oxygen dependent, non-small cell lung cancer with last radiation treatment in July 2017, coronary artery disease, aortic stenosis with associated diastolic dysfunction, hypertension, atrial fibrillation but not on anticoagulation, dyslipidemia, esophageal stricture status post dilation with persistent dysphasia. Patient was hospitalized and subsequently discharged on 09/15/2016 for acute respiratory failure due to healthcare associated pneumonia versus radiation pneumonitis. She was sent home with prednisone 40 mg daily as well as Augmentin. She was to f/u with our office on 10/18.  She developed respiratory distress and ultimately came back to hospital 09/28/2016. Since admit she has required escalating oxygen and has had worsening respiratory failure so PCCM was asked to see.   PAST MEDICAL HISTORY :   has a past medical history of  Abdominal aortic aneurysm (AAA) ([place]); Cataract; COPD (chronic obstructive pulmonary disease) ([place]); Coronary artery disease; Encounter for antineoplastic chemotherapy (05/16/2016); Fall; GERD (gastroesophageal reflux disease); Hiatal hernia; History of esophageal stricture; Hypertension; Lung cancer ([place]); Osteoporosis; and Peripheral arterial disease ([place]).  has a past surgical history that includes Eye surgery; Colonoscopy; Esophagogastroduodenoscopy (egd) with esophageal dilation; Cataract extraction; esopheal dilation (02-26-16); and Endobronchial ultrasound (Bilateral, 03/28/2016). Prior to Admission medications   Medication Sig Start Date End Date Taking? Authorizing Provider  albuterol (PROVENTIL HFA) 108 (90 Base) MCG/ACT inhaler Inhale 2 puffs into the lungs 2 (two) times daily as needed for shortness of breath. 02/10/16 02/09/17 Yes Renee A Kuneff, DO  amLODipine (NORVASC) 5 MG tablet Take 1 tablet (5 mg total) by mouth daily. Patient taking differently: Take 5 mg by mouth every morning.  07/05/16 07/05/17 Yes Renee A Kuneff, DO  aspirin EC 81 MG tablet Take 81 mg by mouth every morning.    Yes Historical Provider, MD  atorvastatin (LIPITOR) 10 MG tablet Take 1 tablet (10 mg total) by mouth daily at 6 PM. Reported on 01/29/2016 02/19/16  Yes Minus Breeding, MD  budesonide-formoterol [facility]) 160-4.5 MCG/ACT inhaler Inhale 2 puffs into the lungs 2 (two) times daily. 08/01/16  Yes Tammy S Parrett, NP  diltiazem (CARDIZEM) 60 MG tablet Take 1 tablet (60 mg total) by mouth 3 (three) times daily. 09/15/16  Yes Clanford Marisa Hua, MD  feeding supplement, ENSURE ENLIVE, (ENSURE ENLIVE) LIQD Take 237 mLs by mouth 2 (two) times daily between meals. 09/15/16  Yes Clanford Marisa Hua, MD  furosemide (LASIX) 20 MG tablet Take 1 tablet (20 mg total) by mouth daily. 09/15/2016  Yes [place],  MD  guaiFENesin (MUCINEX) 600 MG 12 hr tablet Take 1 tablet (600 mg total) by mouth 2 (two) times daily. 09/15/16  Yes  Clanford Marisa Hua, MD  Multiple Vitamin (MULTIVITAMIN WITH MINERALS) TABS tablet Take 1 tablet by mouth daily. 09/28/2016  Yes Clanford Marisa Hua, MD  omeprazole (PRILOSEC) 40 MG capsule Take 1 capsule (40 mg total) by mouth 2 (two) times daily. 07/13/16  Yes Levin Erp, PA  potassium chloride (K-DUR) 10 MEQ tablet Take 1 tablet (10 mEq total) by mouth daily. 09/15/16  Yes Clanford Marisa Hua, MD  predniSONE (DELTASONE) 20 MG tablet Take 2 tablets (40 mg total) by mouth daily with breakfast. 09/15/16  Yes Clanford L Johnson, MD  Sennosides (EX-LAX) 15 MG TABS Take 15 mg by mouth daily as needed (For constipation.).    Yes Historical Provider, MD  Tiotropium Bromide Monohydrate (SPIRIVA RESPIMAT) 2.5 MCG/ACT AERS Inhale 2 puffs into the lungs daily. Patient taking differently: Inhale 2 puffs into the lungs every morning.  08/16/16  Yes [name], MD   No Known Allergies  FAMILY HISTORY:  family history includes Alcohol abuse in her father; Breast cancer (age of onset: 39) in her mother; Early death in her brother; Emphysema in her father; Hypertension in her paternal grandmother. SOCIAL HISTORY:  reports that she quit smoking about 7 months ago. Her smoking use included Cigarettes. She has a 90.00 pack-year smoking history. She has never used smokeless tobacco. She reports that she drinks alcohol. She reports that she does not use drugs.  REVIEW OF SYSTEMS:   Review of Systems  Constitutional: Positive for malaise/fatigue and weight loss. Negative for chills, diaphoresis and fever.  HENT: Negative for congestion, hearing loss, nosebleeds and sore throat.   Eyes: Negative.   Respiratory: Positive for cough and shortness of breath. Negative for hemoptysis and sputum production.   Cardiovascular: Positive for chest pain and palpitations. Negative for orthopnea, claudication and leg swelling.  Gastrointestinal: Positive for heartburn. Negative for nausea and vomiting.  Genitourinary:  Negative.   Musculoskeletal: Negative.   Skin: Negative.   Neurological: Positive for weakness. Negative for headaches.  Endo/Heme/Allergies: Negative.   Psychiatric/Behavioral: Positive for memory loss.   SUBJECTIVE:  Per above  VITAL SIGNS: Temp:  [96.6 F (35.9 C)-99.1 F (37.3 C)] 97.9 F (36.6 C) (10/16 1204) Pulse Rate:  [39-86] 72 (10/16 0753) Resp:  [8-23] 14 (10/16 0753) BP: (120-165)/(76-106) 155/76 (10/16 1204) SpO2:  [92 %-100 %] 93 % (10/16 0755) FiO2 (%):  [40 %] 40 % (10/16 0755) Weight:  [110 lb 6.4 oz (50.1 kg)-110 lb 8 oz (50.1 kg)] 110 lb 8 oz (50.1 kg) (10/16 0750)  PHYSICAL EXAMINATION: General:  Frail elderly white female, currently sitting up in bed. Has significant increased work of breathing.  Neuro:  Awake, alert, her memory is poor. She has generalized weakness  HEENT:  NCAT. Her tongue is dry. Neck veins are flat  Cardiovascular:  RRR, no MRG Lungs:  No wheeze, dry crackles bilaterally + accessory use  Abdomen:  Soft, not tender. + bowel sounds Musculoskeletal:  Equal st and bulk  Skin:  Dry, scattered areas of ecchymosis    Recent Labs Lab 10/01/2016 1238 09/21/2016 1255 09/17/16 0214 09/18/16 0318  NA 138 137 140 139  K 3.1* 3.1* 4.0 3.6  CL 94* 92* 97* 100*  CO2 33*  --  33* 30  BUN '10 14 11 12  '$ CREATININE 0.62 0.60 0.56 0.55  GLUCOSE 148* 142* 198* 157*  Recent Labs Lab 09/14/2016 1238 09/15/2016 1255 09/17/16 0214 09/18/16 0318  HGB 11.2* 12.6 10.6* 10.0*  HCT 35.3* 37.0 33.9* 32.2*  WBC 12.3*  --  8.3 7.7  PLT 231  --  210 185   Dg Chest Port 1 View  Result Date: 09/19/2016 CLINICAL DATA:  Shortness of breath. History of radiation pneumonitis EXAM: PORTABLE CHEST 1 VIEW COMPARISON:  Chest CT September 10, 2016; chest CT September 19, 2016 FINDINGS: There is a degree of underlying emphysematous change, better demonstrated on recent CT examination. Linear scarring and atelectasis remains in the left upper lobe region. There has been  interval clearing of linear atelectasis in the left lower lobe region compared to most recent of radiographic examination. There is diffuse interstitial prominence throughout the lungs bilaterally, stable from recent studies. No new opacity is evident. Heart size and pulmonary vascularity are within normal limits. There is atherosclerotic calcification in the aorta. No adenopathy. IMPRESSION: Scarring and atelectasis in the left upper lobe region remains. There has been interval clearing of atelectatic change from more inferiorly on the left. Diffuse interstitial prominence bilaterally remains, possibly representing a degree of chronic congestive heart failure or residua of chronic inflammatory type change superimposed on emphysema. No new opacity evident. Stable cardiac silhouette. There is aortic atherosclerosis. Electronically Signed   By: Lowella Grip III M.D.   On: 09/19/2016 07:28    ASSESSMENT / PLAN:  Stage IIIA NSCLC Radiation pneumonitis w/ recurrent flare  Severe COPD Chronic respiratory failure Dysphagia  H/o esophageal stricture  afib Acute on chronic diastolic dysfxn Aortic stenosis Essential HTN  Deconditioning  Protein calorie malnutrition    Discussion  Acute on Chronic Hypoxic Respiratory Failure in the setting of what is likely element of mild pulmonary edema; superimposed on recent radiation pneumonitis +/- HCAP and AECOPD. I am also concerned that she may be intermittently aspirating given witnessed cough w/ thin liquids. This may just situational however given her O2 needs and work of breathing. There is really not much more to offer here. She is back on higher dosing of steroids, scheduled BDs, and completed abx. There may be a small element of pulmonary edema at this point but think another large factor here is simply her deconditioning and weakness.  Plan Cont systemic steroids Change BD regimen to nebulized  Change Oxygen to heated high-flow. This may help  prevent desaturations as she is frequently removing her mask 1 time lasix (although I agree that it looks like she is probably on the dry/euvolemia side) Aspiration precautions PRN morphine We may be rapidly approaching hospice/palliative oriented care.   Addendum:  I updated her Son [name]. He is very insightful and knows that his mother is declining. He asked me if we are "at hospice yet". I told him I think that we are very close and that she is declaring this physically as we speak.. As noted above there is little we can offer other than what I outlined. If she doesn't improve in the next 24-48hrs OR if she declines I would fully support transition to palliative focus.    Erick Colace ACNP-BC [place] Pager # [phone] OR # [phone] if no answer  09/19/2016, 2:27 PM    ATTENDING NOTE / ATTESTATION NOTE :   I have discussed the case with the resident/APP  Marni Griffon.   I agree with the resident/APP's  history, physical examination, assessment, and plans.    I have edited the above note and modified it according to  our agreed history, physical examination, assessment and plan.   Briefly, pt with COPD, recent Dx of NSCLC, with recent admission for HCAP +/- radiation pneumonitis. Requiring more O2 this admission.    Comfortable now on 5L [place] with o2 sats 98%. HR 110. BP 160/70, RR 30s. Crackles at bases. Occasional wheezing tachycardic. Rest of exam per [facility].  Labs reviewed.   Assessment : Acute Hypoxemic resp Fx 2/2 : Radiation Pneumonitis flare, Volume overload, AECOPD.  NSCLCA, LUL  Plan : As above per [place]. On 5L [place] > may try HFNC if she desaturates while eating. Cont pulmicort/brovana BID. On spiriva daily >  If with issues using, will switch to atrovent QID. Will dec medrol to 60 mg IV q12 from q6. Slow taper.  Cont diuresis Asp precaution.  Pt is a full DNR.  Agree with palliative care consult.    Family :No family at bedside. Plan d/w  pt.    Monica Becton, MD 09/19/2016, 5:04 PM [place] [facility] Pager [phone] After 3 pm or if no answer, call [phone]

## 2016-09-19 NOTE — Telephone Encounter (Signed)
lmtcb x2 for Ko Vaya at [facility].

## 2016-09-19 NOTE — Progress Notes (Signed)
SLP Cancellation Note  Patient Details Name: Carol [name] MRN: [id number] DOB: [birth year]   Cancelled treatment:     Pt with increasing respiratory needs; POs being held - will f/u next date.      Juan Quam Laurice 09/19/2016, 2:46 PM

## 2016-09-19 NOTE — Progress Notes (Signed)
MD [name] paged on behalf of patient husband; pt husband request that attending MD order pulmonary consult so that patient may be seen by this service while inpatient.

## 2016-09-20 LAB — BASIC METABOLIC PANEL
ANION GAP: 10 (ref 5–15)
BUN: 13 mg/dL (ref 6–20)
CALCIUM: 8.1 mg/dL — AB (ref 8.9–10.3)
CO2: 33 mmol/L — AB (ref 22–32)
CREATININE: 0.54 mg/dL (ref 0.44–1.00)
Chloride: 97 mmol/L — ABNORMAL LOW (ref 101–111)
GFR calc Af Amer: >60 mL/min (ref >60)
GFR calc non Af Amer: >60 mL/min (ref >60)
GLUCOSE: 184 mg/dL — AB (ref 65–99)
Potassium: 2.5 mmol/L — CL (ref 3.5–5.1)
Sodium: 140 mmol/L (ref 135–145)

## 2016-09-20 MED ORDER — POTASSIUM CHLORIDE CRYS ER 20 MEQ PO TBCR
40.0000 meq | EXTENDED_RELEASE_TABLET | Freq: Once | ORAL | Status: AC
  Administered 2016-09-20: 40 meq via ORAL
  Filled 2016-09-20: qty 2

## 2016-09-20 MED ORDER — FUROSEMIDE 10 MG/ML IJ SOLN
40.0000 mg | Freq: Once | INTRAMUSCULAR | Status: AC
  Administered 2016-09-20: 40 mg via INTRAVENOUS
  Filled 2016-09-20: qty 4

## 2016-09-20 NOTE — Progress Notes (Signed)
Name: Carol [name] MRN: [id number] DOB: 1932/11/01    ADMISSION DATE:  09/06/2016 CONSULTATION DATE:  10/16  REFERRING MD :  [name]  CHIEF COMPLAINT:  Acute on chronic respiratory failure   BRIEF PATIENT DESCRIPTION:  82 yo female former smoker presented with dyspnea, chest pain, and weakness for about 3 weeks. She was also noted to have SpO2 84% on RA. She has hx of NSCLC lung cancer and had last XRT/chemo in July 2017. She was dx'ed with radiation pneumonitis and started on prednisone >>her symptoms progressed once her prednisone dose was decreased to 20 mg. There was concern for HCAP and she was started on antibiotics. She is followed by Dr. Ashok Cordia in pulmonary office for COPD. She was discharged to home on 10/12, subsequently readmitted the following day. Since admit on 10/13 has increasing FIO2 requirements.   SIGNIFICANT EVENTS  10/12 discharged on pred/augmentin 10/13 readmitted  10/16 PCCM called to see re: worsening resp failure   STUDIES:   SUBJECTIVE:  Feels about the same VITAL SIGNS: Temp:  [97.6 F (36.4 C)-98.1 F (36.7 C)] 97.8 F (36.6 C) (10/17 0800) Pulse Rate:  [34-78] 75 (10/17 0300) Resp:  [13-24] 24 (10/17 0300) BP: (109-165)/(65-152) 128/76 (10/17 0800) SpO2:  [96 %-100 %] 100 % (10/17 0757) FiO2 (%):  [40 %] 40 % (10/16 1530)  Intake/Output Summary (Last 24 hours) at 09/20/16 0941 Last data filed at 09/20/16 0801  Gross per 24 hour  Intake           702.25 ml  Output             1175 ml  Net          -472.75 ml   PHYSICAL EXAMINATION: General:  Frail elderly white female, currently sitting up in bed. No distress.  Neuro:  Awake, alert, her memory is poor. She has generalized weakness  HEENT:  NCAT. Her tongue is dry. Neck veins are flat  Cardiovascular:  RRR, no MRG Lungs:  No wheeze, dry crackles bilaterally + accessory use  Abdomen:  Soft, not tender. + bowel sounds Musculoskeletal:  Equal st and bulk  Skin:  Dry, scattered  areas of ecchymosis    Recent Labs Lab 09/13/2016 1238 09/18/2016 1255 09/17/16 0214 09/18/16 0318  NA 138 137 140 139  K 3.1* 3.1* 4.0 3.6  CL 94* 92* 97* 100*  CO2 33*  --  33* 30  BUN '10 14 11 12  '$ CREATININE 0.62 0.60 0.56 0.55  GLUCOSE 148* 142* 198* 157*    Recent Labs Lab 09/06/2016 1238 09/25/2016 1255 09/17/16 0214 09/18/16 0318  HGB 11.2* 12.6 10.6* 10.0*  HCT 35.3* 37.0 33.9* 32.2*  WBC 12.3*  --  8.3 7.7  PLT 231  --  210 185   Dg Chest Port 1 View  Result Date: 09/19/2016 CLINICAL DATA:  Shortness of breath. History of radiation pneumonitis EXAM: PORTABLE CHEST 1 VIEW COMPARISON:  Chest CT September 10, 2016; chest CT September 19, 2016 FINDINGS: There is a degree of underlying emphysematous change, better demonstrated on recent CT examination. Linear scarring and atelectasis remains in the left upper lobe region. There has been interval clearing of linear atelectasis in the left lower lobe region compared to most recent of radiographic examination. There is diffuse interstitial prominence throughout the lungs bilaterally, stable from recent studies. No new opacity is evident. Heart size and pulmonary vascularity are within normal limits. There is atherosclerotic calcification in the aorta. No adenopathy. IMPRESSION: Scarring and atelectasis in  the left upper lobe region remains. There has been interval clearing of atelectatic change from more inferiorly on the left. Diffuse interstitial prominence bilaterally remains, possibly representing a degree of chronic congestive heart failure or residua of chronic inflammatory type change superimposed on emphysema. No new opacity evident. Stable cardiac silhouette. There is aortic atherosclerosis. Electronically Signed   By: Lowella Grip III M.D.   On: 09/19/2016 07:28    ASSESSMENT / PLAN:  Stage IIIA NSCLC Radiation pneumonitis w/ recurrent flare  Severe COPD Chronic respiratory failure Dysphagia  H/o esophageal stricture    afib Acute on chronic diastolic dysfxn Aortic stenosis Essential HTN  Deconditioning  Protein calorie malnutrition    Discussion  Acute on Chronic Hypoxic Respiratory Failure in the setting of what is likely element of mild pulmonary edema; superimposed on recent radiation pneumonitis +/- HCAP, +/- intermittent aspiration, and AECOPD. There is really not much more to offer here. She is back on higher dosing of steroids, scheduled BDs, and completed abx. There may be a small element of pulmonary edema at this point but think another large factor here is simply her deconditioning and weakness. She seems to have responded a little to lasix   Plan Cont systemic steroids Change BD regimen to nebulized  Cont wean O2 Lasix again today Aspiration precautions PRN morphine We may be rapidly approaching hospice/palliative oriented care.     Erick Colace ACNP-BC [place] Pager # [phone] OR # [phone] if no answer  09/20/2016, 9:41 AM

## 2016-09-20 NOTE — Progress Notes (Signed)
CRITICAL VALUE ALERT  Critical value received:  Potassium 2.5  Date of notification:  09/20/2016  Time of notification:  5056  Critical value read back:Yes.    Nurse who received alert:  Pieter Partridge  MD notified (1st page):  Dr. [name]  Time of first page:  59  MD notified (2nd page):  Time of second page:  Responding MD: Dr. [name]   Time MD responded:  1150- MD ordered one time dose of Potassium Choloride 19mq PO  Dillen Belmontes A [place] Latouche, RN

## 2016-09-20 NOTE — Evaluation (Signed)
Physical Therapy Evaluation Patient Details Name: Carol [name] MRN: [id number] DOB: Nov 25, 1932 Today's Date: 09/20/2016   History of Present Illness  Carol [name] is a 82 y.o. female with medical history significant for former tobacco abuse and associated oxygen dependent COPD, no non-small cell carcinoma with last radiation treatment July 2017, known CAD, known AAA, aortic stenosis with associated mild diastolic dysfunction, hypertension, atrial fibrillation not on anticoagulation, dyslipidemia, known esophageal stricture status post dilatation with persistent dysphagia.  She was readmitted after d/c hom on 10/12 due to acute respiratory distress when tapering steroids.   Clinical Impression  Patient presents with severe dependencies in mobility due to deficits listed in PT problem list.  She will benefit from skilled PT in the acute setting and will need follow up SNF level rehab at d/c prior to returning home with family assist.     Follow Up Recommendations SNF    Equipment Recommendations  None recommended by PT    Recommendations for Other Services       Precautions / Restrictions Precautions Precautions: Fall Precaution Comments: O2 dependent      Mobility  Bed Mobility Overal bed mobility: Needs Assistance Bed Mobility: Supine to Sit     Supine to sit: Mod assist;HOB elevated     General bed mobility comments: assist for legs off EOB and to lift trunk upright  Transfers Overall transfer level: Needs assistance   Transfers: Sit to/from Stand Sit to Stand: Max assist         General transfer comment: blocked knees and lifted pt up under hips, performed x 2 trials  Ambulation/Gait             General Gait Details: unable even to side step to [place]            Wheelchair Mobility    Modified Rankin (Stroke Patients Only)       Balance Overall balance assessment: Needs assistance Sitting-balance support: Bilateral upper extremity  supported Sitting balance-Leahy Scale: Fair     Standing balance support: Bilateral upper extremity supported Standing balance-Leahy Scale: Poor Standing balance comment: able to maintain standing with mod support x about 15 seconds x 2 trials, attempts weight shifts for stepping, but pt unable                             Pertinent Vitals/Pain Pain Assessment: No/denies pain    Home Living Family/patient expects to be discharged to:: Private residence Living Arrangements: Children Available Help at Discharge: Family;Available 24 hours/day Type of Home: House Home Access: Stairs to enter Entrance Stairs-Rails: Psychiatric nurse of Steps: 6 Home Layout: One level Home Equipment: Walker - 2 wheels;Bedside commode;Shower seat Additional Comments: had oxygen at home, but not using it    Prior Function Level of Independence: Independent with assistive device(s)         Comments: used walker     Hand Dominance        Extremity/Trunk Assessment   Upper Extremity Assessment: Generalized weakness           Lower Extremity Assessment: RLE deficits/detail;LLE deficits/detail RLE Deficits / Details: AAROM WFL, strength hip flexion 2/5, knee extension 3/5, ankle DF 3+/5 LLE Deficits / Details: AAROM WFL, strength hip flexion 2/5, knee extension 3/5, ankle DF 3+/5  Cervical / Trunk Assessment: Kyphotic  Communication   Communication: No difficulties  Cognition Arousal/Alertness: Awake/alert Behavior During Therapy: WFL for tasks assessed/performed Overall Cognitive Status: Within  Functional Limits for tasks assessed                      General Comments General comments (skin integrity, edema, etc.): SpO2 ranged from 77-100 on 6L O2 HR max 144 when standing only briefly, mostly in 110's and SpO2 not reading well throughout session, but pt provided with multiple rest breaks for catching her breath and recovering SpO2 despite poor signal  quality of monitor.     Exercises     Assessment/Plan    PT Assessment Patient needs continued PT services  PT Problem List Decreased strength;Decreased range of motion;Decreased activity tolerance;Decreased balance;Decreased mobility;Decreased knowledge of use of DME          PT Treatment Interventions DME instruction;Functional mobility training;Balance training;Therapeutic exercise;Therapeutic activities;Gait training;Patient/family education    PT Goals (Current goals can be found in the Care Plan section)  Acute Rehab PT Goals Patient Stated Goal: To return to independent PT Goal Formulation: With patient Time For Goal Achievement: 09/27/16 Potential to Achieve Goals: Fair    Frequency Min 3X/week   Barriers to discharge        Co-evaluation               End of Session Equipment Utilized During Treatment: Gait belt;Oxygen Activity Tolerance: Patient limited by fatigue Patient left: in bed;with call bell/phone within reach           Time: 5643-3295 PT Time Calculation (min) (ACUTE ONLY): 40 min   Charges:   PT Evaluation $PT Eval Moderate Complexity: 1 Procedure PT Treatments $Therapeutic Activity: 23-37 mins   PT G Codes:        Reginia Naas 09/23/16, 5:40 PM  Magda Kiel, [place] 09/23/2016

## 2016-09-20 NOTE — Telephone Encounter (Signed)
Pt is currently admitted at [facility]. Spoke with Malachy Mood at [facility]. She is aware that pt is currently admitted. Advised her that if the hospital sees fit, pt will more than likely being admitted to a rehab facility from the hospital. She verbalized understanding. Nothing further was needed.

## 2016-09-20 NOTE — Progress Notes (Signed)
Patient ID: Carol [name], female   DOB: [birth year], 82 y.o.   MRN: [id number]  PROGRESS NOTE    Carol [name]  WNU:[id number] DOB: 20-Sep-1932 DOA: 09/15/2016  PCP: [name], DO   Brief Narrative:  82 y.o.femalewith past medical history significant for COPD, oxygen dependent, non-small cell lung cancer with last radiation treatment in July 2017, coronary artery disease, aortic stenosis with associated diastolic dysfunction, hypertension, atrial fibrillation but not on anticoagulation, dyslipidemia, esophageal stricture status post dilation with persistent dysphasia. Patient was hospitalized and subsequently discharged on 09/15/2016 for acute respiratory failure due to healthcare associated pneumonia versus radiation pneumonitis. Patient required steroids during that time and was sent home with prednisone 40 mg daily as well as Augmentin. She developed respiratory distress and ultimately came back to hospital 09/22/2016. Please note that during the prior hospitalization it was recommended that she goes to skilled nursing facility but patient declined placement at that time.    Assessment & Plan:   Acute hypoxic respiratory failure with hypoxia due to radiation pneumonitis +/- HCAP - Stable respiratory status this morning - Patient is on 5 L nasal cannula oxygen support - She was seen by critical care medicine yesterday who recommended continuing oxygen support, steroids. They also said patient may be approaching palliative care and hospice - Continue Brovana nebulizer twice daily, Pulmicort nebulizer twice daily - Continue Spiriva daily  Chronic Atrial fibrillation with acute RVR - CHADsVASC score 3  - Not an anticoagulation candidate given age and fall risk - Heart rate controlled with Cardizem drip  Aortic stenosis - Not clinically significant at this time - Continue daily aspirin  Chronic diastolic CHF  - Appears euvolemic, compensated   Essential hypertension  - Blood  pressure 128/76  Non-small cell lung cancer of left lung - Patient is status post chemotherapy and radiation treatment - Follow up w/ Oncology as outpatient  Dysphagia - History of esophageal stricture - Status post dilation, no odynophagia at this time  - Continue Protonix - Continue nutritional supplementation  Moderate protein calorie malnutrition - In the context of chronic illness - Continue nutritional supplementation   Hypokalemia - Supplemented and within normal limits    DVT prophylaxis: Lovenox subQ  Code Status: DNR/DNI  Family Communication: no family at the bedside this am Disposition Plan: home once off of Cardizem drip    Consultants:   PT  PCCM  Procedures:   None   Antimicrobials:   None on this admission     Subjective: No overnight events.   Objective: Vitals:   09/20/16 0754 09/20/16 0755 09/20/16 0757 09/20/16 0800  BP:    128/76  Pulse:      Resp:      Temp:    97.8 F (36.6 C)  TempSrc:    Axillary  SpO2: 99% 100% 100%   Weight:      Height:        Intake/Output Summary (Last 24 hours) at 09/20/16 0928 Last data filed at 09/20/16 0801  Gross per 24 hour  Intake           702.25 ml  Output             1175 ml  Net          -472.75 ml   Filed Weights   09/18/16 0411 09/19/16 0322 09/19/16 0750  Weight: 49.8 kg (109 lb 11.2 oz) 50.1 kg (110 lb 6.4 oz) 50.1 kg (110 lb 8 oz)    Examination:  General exam: Appears  calm and comfortable, no distress   Respiratory system: Diminished breath sounds, coarse breath sounds . Cardiovascular system: S1 & S2 heard, Rate controlled  Gastrointestinal system: (+) BS, non tender  Central nervous system: No focal neurological deficits. Extremities: No edema, palpable pulses  Skin: warm, dry  Psychiatry: Normal mood and behavior   Data Reviewed: I have personally reviewed following labs and imaging studies  CBC:  Recent Labs Lab 09/14/16 0524 09/20/2016 1238 09/23/2016 1255  09/17/16 0214 09/18/16 0318  WBC 6.1 12.3*  --  8.3 7.7  NEUTROABS  --  10.4*  --   --   --   HGB 10.7* 11.2* 12.6 10.6* 10.0*  HCT 33.6* 35.3* 37.0 33.9* 32.2*  MCV 90.1 89.1  --  89.7 90.7  PLT 217 231  --  210 329   Basic Metabolic Panel:  Recent Labs Lab 09/14/16 0524 09/15/16 0518 09/18/2016 1230 09/29/2016 1238 09/18/2016 1255 09/17/16 0214 09/18/16 0318  NA 141 140  --  138 137 140 139  K 2.8* 4.0  --  3.1* 3.1* 4.0 3.6  CL 98* 101  --  94* 92* 97* 100*  CO2 33* 33*  --  33*  --  33* 30  GLUCOSE 153* 154*  --  148* 142* 198* 157*  BUN 20 19  --  '10 14 11 12  '$ CREATININE 0.65 0.51  --  0.62 0.60 0.56 0.55  CALCIUM 8.4* 8.4*  --  8.6*  --  8.4* 8.2*  MG  --   --  1.9  --   --   --   --    GFR: Estimated Creatinine Clearance: 43.6 mL/min (by C-G formula based on SCr of 0.55 mg/dL). Liver Function Tests:  Recent Labs Lab 09/15/2016 1238 09/17/16 0214 09/18/16 0318  AST 34 24 22  ALT 42 42 36  ALKPHOS 93 82 76  BILITOT 0.5 0.8 0.7  PROT 5.9* 5.4* 5.0*  ALBUMIN 3.0* 2.7* 2.5*   No results for input(s): LIPASE, AMYLASE in the last 168 hours. No results for input(s): AMMONIA in the last 168 hours. Coagulation Profile: No results for input(s): INR, PROTIME in the last 168 hours. Cardiac Enzymes: No results for input(s): CKTOTAL, CKMB, CKMBINDEX, TROPONINI in the last 168 hours. BNP (last 3 results)  Recent Labs  08/01/16 1048  PROBNP 106.0*   HbA1C: No results for input(s): HGBA1C in the last 72 hours. CBG: No results for input(s): GLUCAP in the last 168 hours. Lipid Profile: No results for input(s): CHOL, HDL, LDLCALC, TRIG, CHOLHDL, LDLDIRECT in the last 72 hours. Thyroid Function Tests: No results for input(s): TSH, T4TOTAL, FREET4, T3FREE, THYROIDAB in the last 72 hours. Anemia Panel: No results for input(s): VITAMINB12, FOLATE, FERRITIN, TIBC, IRON, RETICCTPCT in the last 72 hours. Urine analysis:    Component Value Date/Time   COLORURINE YELLOW  09/15/2016 1445   APPEARANCEUR HAZY (A) 09/29/2016 1445   LABSPEC 1.012 09/23/2016 1445   PHURINE 8.0 09/13/2016 1445   GLUCOSEU NEGATIVE 09/12/2016 1445   HGBUR NEGATIVE 10/04/2016 [place] 09/17/2016 1445   KETONESUR NEGATIVE 09/17/2016 1445   PROTEINUR >300 (A) 09/25/2016 1445   NITRITE NEGATIVE 10/01/2016 1445   LEUKOCYTESUR NEGATIVE 09/06/2016 1445   Sepsis Labs: '@LABRCNTIP'$ (procalcitonin:4,lacticidven:4)    Blood culture (routine x 2)     Status: None   Collection Time: 09/10/16  1:48 PM  Result Value Ref Range Status   Specimen Description BLOOD RIGHT ANTECUBITAL  Final   Special Requests BOTTLES DRAWN  AEROBIC AND ANAEROBIC 5CC  Final   Culture   Final    NO GROWTH 5 DAYS Performed at [facility]    Report Status 09/15/2016 FINAL  Final  Blood culture (routine x 2)     Status: None   Collection Time: 09/10/16  4:13 PM  Result Value Ref Range Status   Specimen Description BLOOD RIGHT ANTECUBITAL  Final   Special Requests BOTTLES DRAWN AEROBIC AND ANAEROBIC 5CC  Final   Culture   Final    NO GROWTH 5 DAYS Performed at [facility]    Report Status 09/15/2016 FINAL  Final  MRSA PCR Screening     Status: None   Collection Time: 09/10/16  6:45 PM  Result Value Ref Range Status   MRSA by PCR NEGATIVE NEGATIVE Final  Urine culture     Status: Abnormal   Collection Time: 09/28/2016  2:45 PM  Result Value Ref Range Status   Specimen Description URINE, CLEAN CATCH  Final   Special Requests NONE  Final   Culture MULTIPLE SPECIES PRESENT, SUGGEST RECOLLECTION (A)  Final   Report Status 09/17/2016 FINAL  Final  MRSA PCR Screening     Status: None   Collection Time: 09/08/2016  7:10 PM  Result Value Ref Range Status   MRSA by PCR NEGATIVE NEGATIVE Final      Radiology Studies: Dg Chest Port 1 View Result Date: 09/19/2016 Scarring and atelectasis in the left upper lobe region remains. There has been interval clearing of atelectatic change  from more inferiorly on the left. Diffuse interstitial prominence bilaterally remains, possibly representing a degree of chronic congestive heart failure or residua of chronic inflammatory type change superimposed on emphysema. No new opacity evident. Stable cardiac silhouette. There is aortic atherosclerosis. Electronically Signed   By: Lowella Grip III M.D.   On: 09/19/2016 07:28   Dg Chest Port 1 View Result Date: 09/28/2016 1. Stable appearance the chest with left hilar and perihilar densities likely primarily from scarring, and atherosclerosis of the thoracic aorta. 2. Emphysema.     Scheduled Meds: . arformoterol  15 mcg Nebulization BID  . aspirin EC  81 mg Oral Daily  . budesonide (PULMICORT) nebulizer solution  0.5 mg Nebulization BID  . enoxaparin (LOVENOX) injection  40 mg Subcutaneous Q24H  . feeding supplement (ENSURE ENLIVE)  237 mL Oral BID BM  . methylPREDNISolone (SOLU-MEDROL) injection  60 mg Intravenous Q12H  . pantoprazole  40 mg Oral Daily  . sodium chloride flush  3 mL Intravenous Q12H  . tiotropium  18 mcg Inhalation Daily   Continuous Infusions: . sodium chloride 30 mL/hr at 09/19/16 1746  . diltiazem (CARDIZEM) infusion 7.5 mg/hr (09/20/16 0857)     LOS: 3 days    Time spent: 15 minutes  Greater than 50% of the time spent on counseling and coordinating the care.   Leisa Lenz, MD [facility] Pager [phone]  If 7PM-7AM, please contact night-coverage [email or web address] Password [email or web address] 09/20/2016, 9:28 AM

## 2016-09-20 NOTE — Progress Notes (Signed)
Speech Language Pathology Treatment: Dysphagia  Patient Details Name: Ridhima Golberg MRN: [id number] DOB: 09-08-1932 Today's Date: 09/20/2016 Time: 6301-6010 SLP Time Calculation (min) (ACUTE ONLY): 10 min  Assessment / Plan / Recommendation Clinical Impression  F/u after clinical swallow evaluation: pt has been tolerating soft diet with thin liquids; continue crushing meds given hx stricture in esophagus.  Today, presents with poor appetite but adequate mastication, brisk swallow response, no overt s/s of aspiration.  RR compatible with adequate respiratory/swallowing reciprocity.  Pt alert, participatory.  Recommend continuing current diet  - no SLP f/u is warranted.  Our services will sign off.      HPI HPI: Levada Bowersox a 82 y.o.femalewith medical history significant for former tobacco abuse and associated oxygen dependent COPD, no non-all cell carcinoma with last radiation treatment July 2017, known CAD, known AAA, aortic stenosis with associated mild diastolic dysfunction, hypertension, atrial fibrillation noton anticoagulation, dyslipidemia, known esophageal stricture status post dilatation with persistent dysphagia. Patient was recently discharged on 10/12 after an admission for acute respiratory failure surmised related to [facility] likely related to radiation pneumonitis. Prior to last admission patient had been on steroids for radiation pneumonitis and had been doing well until the steroids were tapered with resultant acute respiratory distress.       SLP Plan  All goals met     Recommendations  Diet recommendations: Dysphagia 3 (mechanical soft);Thin liquid Liquids provided via: Cup;Straw Medication Administration: Crushed with puree Postural Changes and/or Swallow Maneuvers: Seated upright 90 degrees                Oral Care Recommendations: Oral care BID Plan: All goals met       GO                Juan Quam Laurice 09/20/2016, 3:48 PM

## 2016-09-21 ENCOUNTER — Ambulatory Visit: Admission: RE | Admit: 2016-09-21 | Payer: Medicare Other | Source: Ambulatory Visit | Admitting: Radiation Oncology

## 2016-09-21 ENCOUNTER — Inpatient Hospital Stay: Payer: Medicare Other | Admitting: Adult Health

## 2016-09-21 DIAGNOSIS — E43 Unspecified severe protein-calorie malnutrition: Secondary | ICD-10-CM

## 2016-09-21 DIAGNOSIS — C3492 Malignant neoplasm of unspecified part of left bronchus or lung: Secondary | ICD-10-CM

## 2016-09-21 DIAGNOSIS — J700 Acute pulmonary manifestations due to radiation: Principal | ICD-10-CM

## 2016-09-21 DIAGNOSIS — R1312 Dysphagia, oropharyngeal phase: Secondary | ICD-10-CM

## 2016-09-21 DIAGNOSIS — E876 Hypokalemia: Secondary | ICD-10-CM

## 2016-09-21 DIAGNOSIS — I4891 Unspecified atrial fibrillation: Secondary | ICD-10-CM

## 2016-09-21 DIAGNOSIS — R5381 Other malaise: Secondary | ICD-10-CM

## 2016-09-21 DIAGNOSIS — R0902 Hypoxemia: Secondary | ICD-10-CM

## 2016-09-21 LAB — BASIC METABOLIC PANEL
ANION GAP: 9 (ref 5–15)
BUN: 15 mg/dL (ref 6–20)
CHLORIDE: 96 mmol/L — AB (ref 101–111)
CO2: 35 mmol/L — AB (ref 22–32)
Calcium: 8.1 mg/dL — ABNORMAL LOW (ref 8.9–10.3)
Creatinine, Ser: 0.53 mg/dL (ref 0.44–1.00)
GFR calc Af Amer: >60 mL/min (ref >60)
GLUCOSE: 220 mg/dL — AB (ref 65–99)
POTASSIUM: 2.8 mmol/L — AB (ref 3.5–5.1)
Sodium: 140 mmol/L (ref 135–145)

## 2016-09-21 MED ORDER — POTASSIUM CHLORIDE CRYS ER 20 MEQ PO TBCR
40.0000 meq | EXTENDED_RELEASE_TABLET | Freq: Two times a day (BID) | ORAL | Status: DC
  Administered 2016-09-21 – 2016-09-22 (×3): 40 meq via ORAL
  Filled 2016-09-21 (×3): qty 2

## 2016-09-21 NOTE — Care Management Important Message (Signed)
Important Message  Patient Details  Name: Carol [name] MRN: [id number] Date of Birth: [birth year]   Medicare Important Message Given:  Yes    Shervin Cypert Abena 09/21/2016, 10:59 AM

## 2016-09-21 NOTE — Clinical Social Work Note (Signed)
Clinical Social Work Assessment  Patient Details  Name: Carol [name] MRN: [id number] Date of Birth: [birth year]  Date of referral:  09/21/16               Reason for consult:  Facility Placement                Permission sought to share information with:  Family Supports Permission granted to share information::  Yes, Verbal Permission Granted  Name::     [name]  Agency::     Relationship::  Son  Contact Information:  [phone]  Housing/Transportation Living arrangements for the past 2 months:  [place] of Information:  Patient Patient Interpreter Needed:  None Criminal Activity/Legal Involvement Pertinent to Current Situation/Hospitalization:  No - Comment as needed Significant Relationships:  Adult Children Lives with:  Adult Children Do you feel safe going back to the place where you live?  Yes Need for family participation in patient care:  Yes (Comment)  Care giving concerns:  No family or friends at bedside at this time. CSW spoke with patients son via telephone. Patient's son familiar with SNF process. No conerns at this time.    Social Worker assessment / plan:  CSW spoke with patient at bedside. Patient aware and agreeable to SNF placement following discharge. Patient lived at home with daughter prior to admission. Patient states her kids take great care of her. Patient did not have a specific SNF preference however she suggest CSW call her son [name] to discuss where she will go. Patient son familiar with SNF process. Patient son suggest CSW submit referral to all [facility] SNFs. CSW will fax out patient to all SNFs in [place] area and follow up with patient and son regarding bed offers.   Employment status:  Retired Forensic scientist:  Medicare PT Recommendations:  [place] / Referral to community resources:  [place]  Patient/Family's Response to care:  Patient and patients son verbalize  understanding of CSW role and appreciative of support.   Patient/Family's Understanding of and Emotional Response to Diagnosis, Current Treatment, and Prognosis:  Patient and patient's son understanding of patients physical limitations. Patient and patients son understanding and in agreeance of treatment plan and SNF placement.   Emotional Assessment Appearance:  Appears stated age, Well-Groomed Attitude/Demeanor/Rapport:   (Patient was appropriate) Affect (typically observed):  Accepting, Appropriate, Pleasant Orientation:  Oriented to Self, Oriented to Place, Oriented to  Time, Oriented to Situation Alcohol / Substance use:  Not Applicable Psych involvement (Current and /or in the community):  No (Comment)  Discharge Needs  Concerns to be addressed:  No discharge needs identified Readmission within the last 30 days:  No Current discharge risk:  Dependent with Mobility Barriers to Discharge:  Continued Medical Work up   [employer], [place]

## 2016-09-21 NOTE — Progress Notes (Signed)
PROGRESS NOTE    Carol [name]  XTK:[id number] DOB: 05-26-1932 DOA: 09/12/2016 PCP: [name], DO   Brief Narrative:  82 y.o.femalewith past medical history significant for COPD, oxygen dependent, non-small cell lung cancer with last radiation treatment in July 2017, coronary artery disease, aortic stenosis with associated diastolic dysfunction, hypertension, atrial fibrillation but not on anticoagulation, dyslipidemia, esophageal stricture status post dilation with persistent dysphasia. Patient was hospitalized and subsequently discharged on 09/15/2016 for acute respiratory failure due to healthcare associated pneumonia versus radiation pneumonitis. Patient required steroids during that time and was sent home with prednisone 40 mg daily as well as Augmentin. She developed respiratory distress and ultimately came back to hospital 09/05/2016. Please note that during the prior hospitalization it was recommended that she goes to skilled nursing facility but patient declined placement at that time.    Assessment & Plan:   Principal Problem:   Acute on chronic respiratory failure with hypoxia (HCC) Active Problems:   COPD (chronic obstructive pulmonary disease) (HCC)   HTN (hypertension)   Hyperlipidemia   Aortic stenosis   Dysphagia   History of esophageal stricture   Non-small cell cancer of left lung (HCC)   Radiation pneumonitis (HCC)   Severe protein-calorie malnutrition (HCC)   Dehydration   Acute hypokalemia   Mild diastolic dysfunction   Physical deconditioning   Leukocytosis   Atrial fibrillation with RVR (HCC)   Hypoxia   Acute pulmonary edema (HCC)   Acute hypoxic respiratory failure with hypoxia due to radiation pneumonitis +/- HCAP - Patient is on 5.5 L nasal cannula oxygen support - She was seen by critical care medicine yesterday who recommended continuing oxygen support, steroids. They also said patient may be approaching palliative care and hospice - Continue  Brovana nebulizer twice daily, Pulmicort nebulizer twice daily - Continue Spiriva daily  Goals of Care - will consult Palliative Care - Patient states she would like her son in law to be present for conversations as patient states he knows all the doctors that have been taking care of her and has been keeping up to date on all the medical treatment plans - PT suggesting SNF  Chronic Atrial fibrillation with acute RVR - CHADsVASC score 3  - Not ananticoagulation candidate given age and fall risk - Heart rate controlled with Cardizem drip - still on cardizem  - heart rate in low 100s currently  Aortic stenosis - Not clinically significant at this time - Continue daily aspirin  Chronic diastolic CHF  - Appears euvolemic, compensated  - did receive lasix yesterday per PCCM note - net of -69m  Essential hypertension  - Blood pressure 128/90  Non-small cell lung cancer of left lung - Patient is status post chemotherapy and radiation treatment - Follow up w/ Oncology as outpatient  Dysphagia - History of esophageal stricture - Status post dilation, no odynophagia at this time  - Continue Protonix - Continue nutritional supplementation  Moderate protein calorie malnutrition - In the context of chronic illness - Continue nutritional supplementation  - patient reports poor PO intake, no appetite  Hypokalemia - Supplemented - continue to monitor - repeat BMP this am   DVT prophylaxis: Lovenox SubQ Code Status: DNR/ DNI Family Communication: no family bedside Disposition Plan: unclear disposition- very guarded prognosis, PCCM stating patient may be approaching palliative care/ hospice   Consultants:   PCCM  PT  Procedures:   None  Antimicrobials:   None    Subjective: Patient awake and sitting in bed.  She reports  that she feels stable from yesterday.  Reports she has not eaten much in the past couple of days- mentions she understands that she needs  to increase what she is eating, especially of protein, but she has just not made the effort to increase what she eats.  She does report her breathing is about the same.  Mentions she trusts Dr. Ashok Cordia for his assessment of her health status.  Palliative care discussed- patient open to talking to them but would like her son in law to be present as she mentions that he has been following her treatment.  Objective: Vitals:   09/21/16 0357 09/21/16 0400 09/21/16 0500 09/21/16 0600  BP: 128/85 112/71 119/74 128/69  Resp:  13 17 (!) 29  Temp: 97.3 F (36.3 C)     TempSrc: Oral     SpO2: 99% 98% 100% 97%  Weight: 50.3 kg (111 lb)     Height:        Intake/Output Summary (Last 24 hours) at 09/21/16 0742 Last data filed at 09/21/16 0600  Gross per 24 hour  Intake           1965.5 ml  Output             2000 ml  Net            -34.5 ml   Filed Weights   09/19/16 0322 09/19/16 0750 09/21/16 0357  Weight: 50.1 kg (110 lb 6.4 oz) 50.1 kg (110 lb 8 oz) 50.3 kg (111 lb)    Examination:  General exam: Appears calm and comfortable  Respiratory system: Clear to auscultation. Respiratory effort normal. Cardiovascular system: S1 & S2 heard, RRR. No JVD, murmurs, rubs, gallops or clicks. No pedal edema. Gastrointestinal system: Abdomen is nondistended, soft and nontender. No organomegaly or masses felt. Normal bowel sounds heard. Central nervous system: Alert and oriented. No focal neurological deficits. Extremities: Symmetric 5 x 5 power. Skin: No rashes, lesions or ulcers Psychiatry: Judgement and insight appear normal. Mood & affect appropriate.     Data Reviewed: I have personally reviewed following labs and imaging studies  CBC:  Recent Labs Lab 09/19/2016 1238 09/28/2016 1255 09/17/16 0214 09/18/16 0318  WBC 12.3*  --  8.3 7.7  NEUTROABS 10.4*  --   --   --   HGB 11.2* 12.6 10.6* 10.0*  HCT 35.3* 37.0 33.9* 32.2*  MCV 89.1  --  89.7 90.7  PLT 231  --  210 782   Basic Metabolic  Panel:  Recent Labs Lab 09/15/16 0518 09/22/2016 1230 09/07/2016 1238 09/19/2016 1255 09/17/16 0214 09/18/16 0318 09/20/16 1011  NA 140  --  138 137 140 139 140  K 4.0  --  3.1* 3.1* 4.0 3.6 2.5*  CL 101  --  94* 92* 97* 100* 97*  CO2 33*  --  33*  --  33* 30 33*  GLUCOSE 154*  --  148* 142* 198* 157* 184*  BUN 19  --  '10 14 11 12 13  '$ CREATININE 0.51  --  0.62 0.60 0.56 0.55 0.54  CALCIUM 8.4*  --  8.6*  --  8.4* 8.2* 8.1*  MG  --  1.9  --   --   --   --   --    GFR: Estimated Creatinine Clearance: 43.8 mL/min (by C-G formula based on SCr of 0.54 mg/dL). Liver Function Tests:  Recent Labs Lab 09/11/2016 1238 09/17/16 0214 09/18/16 0318  AST 34 24 22  ALT 42 42 36  ALKPHOS 93 82 76  BILITOT 0.5 0.8 0.7  PROT 5.9* 5.4* 5.0*  ALBUMIN 3.0* 2.7* 2.5*   No results for input(s): LIPASE, AMYLASE in the last 168 hours. No results for input(s): AMMONIA in the last 168 hours. Coagulation Profile: No results for input(s): INR, PROTIME in the last 168 hours. Cardiac Enzymes: No results for input(s): CKTOTAL, CKMB, CKMBINDEX, TROPONINI in the last 168 hours. BNP (last 3 results)  Recent Labs  08/01/16 1048  PROBNP 106.0*   HbA1C: No results for input(s): HGBA1C in the last 72 hours. CBG: No results for input(s): GLUCAP in the last 168 hours. Lipid Profile: No results for input(s): CHOL, HDL, LDLCALC, TRIG, CHOLHDL, LDLDIRECT in the last 72 hours. Thyroid Function Tests: No results for input(s): TSH, T4TOTAL, FREET4, T3FREE, THYROIDAB in the last 72 hours. Anemia Panel: No results for input(s): VITAMINB12, FOLATE, FERRITIN, TIBC, IRON, RETICCTPCT in the last 72 hours. Sepsis Labs:  Recent Labs Lab 09/05/2016 1419  LATICACIDVEN 1.18    Recent Results (from the past 240 hour(s))  Urine culture     Status: Abnormal   Collection Time: 09/06/2016  2:45 PM  Result Value Ref Range Status   Specimen Description URINE, CLEAN CATCH  Final   Special Requests NONE  Final   Culture  MULTIPLE SPECIES PRESENT, SUGGEST RECOLLECTION (A)  Final   Report Status 09/17/2016 FINAL  Final  MRSA PCR Screening     Status: None   Collection Time: 09/08/2016  7:10 PM  Result Value Ref Range Status   MRSA by PCR NEGATIVE NEGATIVE Final    Comment:        The GeneXpert MRSA Assay (FDA approved for NASAL specimens only), is one component of a comprehensive MRSA colonization surveillance program. It is not intended to diagnose MRSA infection nor to guide or monitor treatment for MRSA infections.          Radiology Studies: No results found.      Scheduled Meds: . arformoterol  15 mcg Nebulization BID  . aspirin EC  81 mg Oral Daily  . budesonide (PULMICORT) nebulizer solution  0.5 mg Nebulization BID  . enoxaparin (LOVENOX) injection  40 mg Subcutaneous Q24H  . feeding supplement (ENSURE ENLIVE)  237 mL Oral BID BM  . methylPREDNISolone (SOLU-MEDROL) injection  60 mg Intravenous Q12H  . pantoprazole  40 mg Oral Daily  . sodium chloride flush  3 mL Intravenous Q12H  . tiotropium  18 mcg Inhalation Daily   Continuous Infusions: . sodium chloride 30 mL/hr at 09/21/16 0552  . diltiazem (CARDIZEM) infusion 7.5 mg/hr (09/20/16 2206)     LOS: 4 days    Time spent: 40 minutes    Newman Pies, MD [facility] Pager [phone]  If 7PM-7AM, please contact night-coverage [email or web address] Password [email or web address] 09/21/2016, 7:42 AM

## 2016-09-21 NOTE — NC FL2 (Signed)
[place] LEVEL OF CARE SCREENING TOOL     IDENTIFICATION  Patient Name: Carol [name] Birthdate: 10/01/1932 Sex: female Admission Date (Current Location): 10/03/2016  [facility] and [place] Number:  Herbalist and Address:  [facility], [place] [address], [place], [place] [address]      Provider Number: [id number]  Attending Physician Name and Address:  Wallis Bamberg, MD  Relative Name and Phone Number:  [name], [phone]    Current Level of Care: Hospital Recommended Level of Care: [place] Prior Approval Number:    Date Approved/Denied:   PASRR Number: [id number]  Discharge Plan: SNF    Current Diagnoses: Patient Active Problem List   Diagnosis Date Noted  . Acute pulmonary edema (HCC)   . Acute on chronic respiratory failure with hypoxia ([place]) 10/04/2016  . Dehydration 09/30/2016  . Acute hypokalemia 10/04/2016  . Mild diastolic dysfunction 83/15/1761  . Physical deconditioning 09/17/2016  . Leukocytosis 09/17/2016  . Atrial fibrillation with RVR ([place]) 09/06/2016  . Hypoxia 09/13/2016  . Recent treatment for HCAP (healthcare-associated pneumonia)   . Severe protein-calorie malnutrition ([place]) 09/13/2016  . COPD exacerbation ([place]) 09/10/2016  . Chest pain 09/07/2016  . Radiation pneumonitis ([place]) 08/16/2016  . Encounter for antineoplastic chemotherapy 05/16/2016  . Non-small cell cancer of left lung ([place]) 04/25/2016  . Mass of lower lobe of left lung   . PAD (peripheral artery disease) ([place]) 03/14/2016  . Oropharyngeal dysphagia 02/22/2016  . History of esophageal stricture 02/22/2016  . H/O hiatal hernia 02/22/2016  . Coronary atherosclerosis of native coronary artery 02/19/2016  . Atherosclerosis of aorta ([place]) 02/19/2016  . Former smoker 02/10/2016  . Aortic stenosis 02/10/2016  . Hernia, hiatal 02/10/2016  . Acute renal failure (ARF) ([place]) 01/29/2016  . COPD (chronic  obstructive pulmonary disease) ([place]) 01/29/2016  . HTN (hypertension) 01/29/2016  . Hyperlipidemia 01/29/2016  . Abnormal EKG 01/29/2016  . AAA (abdominal aortic aneurysm) ([place]) 01/29/2016    Orientation RESPIRATION BLADDER Height & Weight     Self, Time, Situation, Place  O2 (Nasal Cannual, 6 L) Continent Weight: 111 lb (50.3 kg) Height:  5' 3.6" (161.5 cm)  BEHAVIORAL SYMPTOMS/MOOD NEUROLOGICAL BOWEL NUTRITION STATUS      Continent Diet (DIET DYS 3; thin liquids)  AMBULATORY STATUS COMMUNICATION OF NEEDS Skin   Extensive Assist Verbally Normal                       Personal Care Assistance Level of Assistance  Bathing, Feeding, Dressing Bathing Assistance: Limited assistance Feeding assistance: Independent Dressing Assistance: Limited assistance     Functional Limitations Info  Sight, Hearing, Speech Sight Info: Adequate Hearing Info: Adequate Speech Info: Adequate    SPECIAL CARE FACTORS FREQUENCY  PT (By licensed PT), OT (By licensed OT)     PT Frequency: 3x week OT Frequency: 3x week            Contractures Contractures Info: Not present    Additional Factors Info  Code Status Code Status Info: DNR Allergies Info: No know allergies           Current Medications (09/21/2016):  This is the current hospital active medication list Current Facility-Administered Medications  Medication Dose Route Frequency Provider Last Rate Last Dose  . 0.9 %  sodium chloride infusion   Intravenous Continuous Cherene Altes, MD 30 mL/hr at 09/21/16 [phone]    . acetaminophen (TYLENOL) tablet 650 mg  650 mg  Oral Q6H PRN Samella Parr, NP       Or  . acetaminophen (TYLENOL) suppository 650 mg  650 mg Rectal Q6H PRN Samella Parr, NP   650 mg at 09/17/16 0538  . arformoterol (BROVANA) nebulizer solution 15 mcg  15 mcg Nebulization BID Erick Colace, NP   15 mcg at 09/21/16 0912  . aspirin EC tablet 81 mg  81 mg Oral Daily Samella Parr, NP   81 mg at 09/21/16  1011  . budesonide (PULMICORT) nebulizer solution 0.5 mg  0.5 mg Nebulization BID Erick Colace, NP   0.5 mg at 09/21/16 0916  . diltiazem (CARDIZEM) 100 mg in dextrose 5% 171m (1 mg/mL) infusion  5-15 mg/hr Intravenous Titrated JCherene Altes MD 7.5 mL/hr at 09/20/16 2206 7.5 mg/hr at 09/20/16 2206  . enoxaparin (LOVENOX) injection 40 mg  40 mg Subcutaneous Q24H ASamella Parr NP   40 mg at 09/20/16 2028  . feeding supplement (ENSURE ENLIVE) (ENSURE ENLIVE) liquid 237 mL  237 mL Oral BID BM ASamella Parr NP   237 mL at 09/21/16 1011  . levalbuterol (XOPENEX) nebulizer solution 0.63 mg  0.63 mg Nebulization Q3H PRN JCherene Altes MD   0.63 mg at 09/18/16 0901  . LORazepam (ATIVAN) injection 0.5 mg  0.5 mg Intravenous Q6H PRN JCherene Altes MD   0.5 mg at 09/18/16 1444  . methylPREDNISolone sodium succinate (SOLU-MEDROL) 125 mg/2 mL injection 60 mg  60 mg Intravenous Q12H JCalhoun MD   60 mg at 09/21/16 1354  . morphine (morphine sulfate) injection for inhalation 10 mg  10 mg Inhalation Q4H PRN ASamella Parr NP      . morphine CONCENTRATE 10 MG/0.5ML oral solution 10 mg  10 mg Oral Q4H PRN ASamella Parr NP   10 mg at 09/20/16 2035  . ondansetron (ZOFRAN) injection 4 mg  4 mg Intravenous Q6H PRN JCherene Altes MD      . pantoprazole (PROTONIX) EC tablet 40 mg  40 mg Oral Daily ASamella Parr NP   40 mg at 09/21/16 1011  . potassium chloride SA (K-DUR,KLOR-CON) CR tablet 40 mEq  40 mEq Oral BID AWallis Bamberg MD   40 mEq at 09/21/16 1408  . prochlorperazine (COMPAZINE) injection 10 mg  10 mg Intravenous Q6H PRN JCherene Altes MD   10 mg at 09/18/16 0418  . sodium chloride flush (NS) 0.9 % injection 3 mL  3 mL Intravenous Q12H ASamella Parr NP   3 mL at 09/20/16 2028  . tiotropium (SPIRIVA) inhalation capsule 18 mcg  18 mcg Inhalation Daily PElwin Mocha MD   18 mcg at 09/20/16 01275    Discharge Medications: Please see discharge summary  for a list of discharge medications.  Relevant Imaging Results:  Relevant Lab Results:   Additional Information SSN: [id number]  BSela [place]

## 2016-09-22 DIAGNOSIS — R0789 Other chest pain: Secondary | ICD-10-CM

## 2016-09-22 DIAGNOSIS — Z7189 Other specified counseling: Secondary | ICD-10-CM

## 2016-09-22 DIAGNOSIS — Z515 Encounter for palliative care: Secondary | ICD-10-CM

## 2016-09-22 DIAGNOSIS — R0602 Shortness of breath: Secondary | ICD-10-CM

## 2016-09-22 LAB — BASIC METABOLIC PANEL
ANION GAP: 10 (ref 5–15)
BUN: 18 mg/dL (ref 6–20)
CHLORIDE: 99 mmol/L — AB (ref 101–111)
CO2: 30 mmol/L (ref 22–32)
Calcium: 8.6 mg/dL — ABNORMAL LOW (ref 8.9–10.3)
Creatinine, Ser: 0.51 mg/dL (ref 0.44–1.00)
GFR calc non Af Amer: >60 mL/min (ref >60)
Glucose, Bld: 206 mg/dL — ABNORMAL HIGH (ref 65–99)
Potassium: 4 mmol/L (ref 3.5–5.1)
Sodium: 139 mmol/L (ref 135–145)

## 2016-09-22 MED ORDER — TRAZODONE HCL 50 MG PO TABS: 25.0000 mg | ORAL_TABLET | Freq: Every evening | ORAL | Status: DC | PRN

## 2016-09-22 MED ORDER — GLYCOPYRROLATE 1 MG PO TABS
1.0000 mg | ORAL_TABLET | ORAL | Status: DC | PRN
  Filled 2016-09-22: qty 1

## 2016-09-22 MED ORDER — HALOPERIDOL LACTATE 5 MG/ML IJ SOLN: 0.5000 mg | INTRAMUSCULAR | Status: DC | PRN

## 2016-09-22 MED ORDER — POLYVINYL ALCOHOL 1.4 % OP SOLN
1.0000 [drp] | Freq: Four times a day (QID) | OPHTHALMIC | Status: DC | PRN
  Filled 2016-09-22: qty 15

## 2016-09-22 MED ORDER — HALOPERIDOL LACTATE 2 MG/ML PO CONC
0.5000 mg | ORAL | Status: DC | PRN
  Filled 2016-09-22: qty 0.3

## 2016-09-22 MED ORDER — MORPHINE SULFATE (CONCENTRATE) 10 MG/0.5ML PO SOLN
10.0000 mg | ORAL | Status: DC | PRN
  Administered 2016-09-23 (×4): 10 mg via ORAL
  Filled 2016-09-22 (×4): qty 0.5

## 2016-09-22 MED ORDER — GLYCOPYRROLATE 0.2 MG/ML IJ SOLN
0.2000 mg | INTRAMUSCULAR | Status: DC | PRN
  Filled 2016-09-22 (×2): qty 1

## 2016-09-22 MED ORDER — GLYCOPYRROLATE 0.2 MG/ML IJ SOLN
0.2000 mg | INTRAMUSCULAR | Status: DC | PRN
  Administered 2016-09-23 – 2016-09-24 (×3): 0.2 mg via INTRAVENOUS
  Filled 2016-09-22: qty 1

## 2016-09-22 MED ORDER — LORAZEPAM 0.5 MG PO TABS
0.5000 mg | ORAL_TABLET | ORAL | Status: DC | PRN
  Administered 2016-09-23: 0.5 mg via ORAL
  Filled 2016-09-22: qty 1

## 2016-09-22 MED ORDER — DIPHENHYDRAMINE HCL 50 MG/ML IJ SOLN: 12.5000 mg | INTRAMUSCULAR | Status: DC | PRN

## 2016-09-22 MED ORDER — DILTIAZEM HCL 60 MG PO TABS
60.0000 mg | ORAL_TABLET | Freq: Three times a day (TID) | ORAL | Status: DC
  Administered 2016-09-22 – 2016-09-23 (×5): 60 mg via ORAL
  Filled 2016-09-22 (×5): qty 1

## 2016-09-22 MED ORDER — ACETAMINOPHEN 325 MG PO TABS: 650.0000 mg | ORAL_TABLET | Freq: Four times a day (QID) | ORAL | Status: DC | PRN

## 2016-09-22 MED ORDER — HALOPERIDOL 0.5 MG PO TABS
0.5000 mg | ORAL_TABLET | ORAL | Status: DC | PRN
  Filled 2016-09-22: qty 1

## 2016-09-22 MED ORDER — SENNA 8.6 MG PO TABS
1.0000 | ORAL_TABLET | Freq: Every day | ORAL | Status: DC
  Administered 2016-09-22: 8.6 mg via ORAL
  Filled 2016-09-22: qty 1

## 2016-09-22 MED ORDER — MORPHINE SULFATE ER 15 MG PO TBCR
15.0000 mg | EXTENDED_RELEASE_TABLET | Freq: Two times a day (BID) | ORAL | Status: DC
  Administered 2016-09-22 – 2016-09-23 (×3): 15 mg via ORAL
  Filled 2016-09-22 (×3): qty 1

## 2016-09-22 MED ORDER — ACETAMINOPHEN 650 MG RE SUPP: 650.0000 mg | Freq: Four times a day (QID) | RECTAL | Status: DC | PRN

## 2016-09-22 NOTE — Progress Notes (Signed)
PT Cancellation Note  Patient Details Name: Semaj Kham MRN: [id number] DOB: 1932/03/17   Cancelled Treatment:    Reason Eval/Treat Not Completed: Medical issues which prohibited therapy; patient SOB at the moment and unable to attempt OOB.  Will check again later today.   Reginia Naas 09/22/2016, 11:17 AM  Magda Kiel, [place] 09/22/2016

## 2016-09-22 NOTE — Consult Note (Signed)
Consultation Note Date: 09/22/2016   Patient Name: Carol [name]  DOB: [birth year]  MRN: [id number]  Age / Sex: 82 y.o., female  PCP: Ma Hillock, DO Referring Physician: Wallis Bamberg, MD  Reason for Consultation: Establishing goals of care  HPI/Patient Profile: 82 y.o. female  with past medical history of COPD, NSCL (s/p chemotx and radtx July, 2017), A-fib, and radiation pneumonitits admitted on 09/26/2016 with chest pain and SOB. Workup revealed COPD exacerbation and A-fib requiring rate control with IV cardizem with plan to transition to PO before discharge. Pulmonology consulted and noted patient at max therapy for COPD. Palliative care consulted for [place].   Clinical Assessment and Goals of Care: Met with patient's three daughters and son in law. Family notes that since cancer diagnosis, pt has had significant decline. She was recently hospitalized and discharged home with COPD exacerbation, pnuemonia, and a-fib and declined recommendations for SNF. She returned approximately one day later for this admission. Discussed disease trajectory with patient and family. They recognize that patient is at end stage of COPD and request main goals of care to be comfort. They would like to treat what is treatable and then for patient to return home with Hospice services.   Primary Decision Maker HCPOA- son in law- Massie Bougie    SUMMARY OF RECOMMENDATIONS --SOB:   -Continue Morphine intensol (17m/0.5mL) 149mSL q1hr prn moderate SOB and as  premed for any exertional activity  -Fan blowing directly in face  -Keep room cool  -Start MS Contin 1562mO BID for long acting opioid control of SOB --Anxiety/Agitation:  -lorazepam 1mg58m or IV q 4 hours prn  -haloperidol .5mg 38m IV, SL q 4hrs prn --Pulmonary edema/Audible Terminal Secretions:  -Robinul 1 mg po or .2mg I42mr SL q 4 hrs prn --Titrate O2 via [place]  based on comfort --Consult to case management for Hospice home services    Code Status/Advance Care Planning:  DNR    Symptom Management:   See above  Palliative Prophylaxis:   Frequent Pain Assessment  Additional Recommendations (Limitations, Scope, Preferences):  Avoid Hospitalization, Initiate Comfort Feeding, No Artificial Feeding, No Chemotherapy, No Radiation, No Surgical Procedures and No Tracheostomy  Psycho-social/Spiritual:   Desire for further Chaplaincy support:No  Additional Recommendations: Caregiving  Support/Resources and Education on Hospice  Prognosis:    < 3 months d/t AECOPD with frequent exacerbations and hospitalizations, worsened by radiation pneumonitis, tx maximized per Pulmonary, decreased PO intake, decreased functional capacity, family desire for comfort care  Discharge Planning: Home with Hospice  Primary Diagnoses: Present on Admission: . COPD (chronic obstructive pulmonary disease) (HCC) .[place] on chronic respiratory failure with hypoxia (HCC) .[place] (hypertension) . Hyperlipidemia . Non-small cell cancer of left lung (HCC) .[place] pneumonitis (HCC) .[place] protein-calorie malnutrition (HCC) .[place] . Acute hypokalemia . Mild diastolic dysfunction . Physical deconditioning . Leukocytosis . Atrial fibrillation with RVR (HCC) .[place]   I have reviewed the medical record, interviewed the patient and family, and examined the patient. The  following aspects are pertinent.  Past Medical History:  Diagnosis Date  . Abdominal aortic aneurysm (AAA) ([place])   . Cataract   . COPD (chronic obstructive pulmonary disease) ([place])   . Coronary artery disease   . Encounter for antineoplastic chemotherapy 05/16/2016  . Fall   . GERD (gastroesophageal reflux disease)   . Hiatal hernia   . History of esophageal stricture   . Hypertension   . Lung cancer ([place])    squamous cell carcinoma LLL  . Osteoporosis   . Peripheral arterial  disease ([place])    Social History   Social History  . Marital status: Divorced    Spouse name: N/A  . Number of children: 47  . Years of education: N/A   Occupational History  . Retired     Glass blower/designer, Clinical cytogeneticist   Social History Main Topics  . Smoking status: Former Smoker    Packs/day: 1.50    Years: 60.00    Types: Cigarettes    Quit date: 02/01/2016  . Smokeless tobacco: Never Used  . Alcohol use 0.0 oz/week     Comment: 1-2 glasses of wine daily  . Drug use: No  . Sexual activity: No   Other Topics Concern  . None   Social History Narrative   Divorced. Recently moved to [place] from [place], and lives with her daughter and son-in-law.   HS diploma. Retired.    Former smoker: Quit February 2017, with at least 55-pack-year history.   Occasional glass of wine, no drug use.   Caffeinated beverages, takes a daily vitamin.   Wears her seatbelt. Has dentures.   Smoke detector in the home.   Feels safe in her relationships.      [place] [facility]:   From [place], [place]. Previously has lived in [place]. She moved to [facility] in 2013. 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 No pets currently. No bird exposure. No mold exposure.    Family History  Problem Relation Age of Onset  . Breast cancer Mother 57  . Alcohol abuse Father   . Emphysema Father   . Hypertension Paternal Grandmother   . Early death Brother     MVA  . Rheumatologic disease Neg Hx    Scheduled Meds: . arformoterol  15 mcg Nebulization BID  . aspirin EC  81 mg Oral Daily  . budesonide (PULMICORT) nebulizer solution  0.5 mg Nebulization BID  . diltiazem  60 mg Oral Q8H  . enoxaparin (LOVENOX) injection  40 mg Subcutaneous Q24H  . feeding supplement (ENSURE ENLIVE)  237 mL Oral BID BM  . methylPREDNISolone (SOLU-MEDROL) injection  60 mg Intravenous Q12H  . morphine  15 mg Oral Q12H  . pantoprazole  40 mg Oral Daily  . potassium chloride  40 mEq  Oral BID  . senna  1 tablet Oral QHS  . sodium chloride flush  3 mL Intravenous Q12H  . tiotropium  18 mcg Inhalation Daily   Continuous Infusions: . sodium chloride 30 mL/hr at 09/21/16 2154  . diltiazem (CARDIZEM) infusion 7.5 mg/hr (09/21/16 2327)   PRN Meds:.acetaminophen **OR** acetaminophen, diphenhydrAMINE, glycopyrrolate **OR** glycopyrrolate **OR** glycopyrrolate, haloperidol **OR** haloperidol **OR** haloperidol lactate, levalbuterol, LORazepam, LORazepam, morphine, morphine CONCENTRATE, ondansetron (ZOFRAN) IV, polyvinyl alcohol, prochlorperazine, traZODone Medications Prior to Admission:  Prior to Admission medications   Medication Sig Start Date End Date Taking? Authorizing Provider  albuterol (PROVENTIL HFA) 108 (90 Base) MCG/ACT inhaler Inhale 2 puffs  into the lungs 2 (two) times daily as needed for shortness of breath. 02/10/16 02/09/17 Yes Renee A Kuneff, DO  amLODipine (NORVASC) 5 MG tablet Take 1 tablet (5 mg total) by mouth daily. Patient taking differently: Take 5 mg by mouth every morning.  07/05/16 07/05/17 Yes Renee A Kuneff, DO  aspirin EC 81 MG tablet Take 81 mg by mouth every morning.    Yes Historical Provider, MD  atorvastatin (LIPITOR) 10 MG tablet Take 1 tablet (10 mg total) by mouth daily at 6 PM. Reported on 01/29/2016 02/19/16  Yes Minus Breeding, MD  budesonide-formoterol [facility]) 160-4.5 MCG/ACT inhaler Inhale 2 puffs into the lungs 2 (two) times daily. 08/01/16  Yes Tammy S Parrett, NP  diltiazem (CARDIZEM) 60 MG tablet Take 1 tablet (60 mg total) by mouth 3 (three) times daily. 09/15/16  Yes Clanford Marisa Hua, MD  feeding supplement, ENSURE ENLIVE, (ENSURE ENLIVE) LIQD Take 237 mLs by mouth 2 (two) times daily between meals. 09/15/16  Yes Clanford Marisa Hua, MD  furosemide (LASIX) 20 MG tablet Take 1 tablet (20 mg total) by mouth daily. 09/26/2016  Yes Clanford Marisa Hua, MD  guaiFENesin (MUCINEX) 600 MG 12 hr tablet Take 1 tablet (600 mg total) by mouth 2 (two)  times daily. 09/15/16  Yes Clanford Marisa Hua, MD  Multiple Vitamin (MULTIVITAMIN WITH MINERALS) TABS tablet Take 1 tablet by mouth daily. 09/06/2016  Yes Clanford Marisa Hua, MD  omeprazole (PRILOSEC) 40 MG capsule Take 1 capsule (40 mg total) by mouth 2 (two) times daily. 07/13/16  Yes Levin Erp, PA  potassium chloride (K-DUR) 10 MEQ tablet Take 1 tablet (10 mEq total) by mouth daily. 09/15/16  Yes Clanford Marisa Hua, MD  predniSONE (DELTASONE) 20 MG tablet Take 2 tablets (40 mg total) by mouth daily with breakfast. 09/15/16  Yes Clanford L Johnson, MD  Sennosides (EX-LAX) 15 MG TABS Take 15 mg by mouth daily as needed (For constipation.).    Yes Historical Provider, MD  Tiotropium Bromide Monohydrate (SPIRIVA RESPIMAT) 2.5 MCG/ACT AERS Inhale 2 puffs into the lungs daily. Patient taking differently: Inhale 2 puffs into the lungs every morning.  08/16/16  Yes [name], MD   No Known Allergies Review of Systems  Constitutional: Positive for activity change, appetite change and fatigue.  HENT: Positive for congestion.   Eyes: Negative.   Respiratory: Positive for cough and shortness of breath.   Cardiovascular: Positive for chest pain and palpitations.  Gastrointestinal: Negative.   Endocrine: Negative.   Genitourinary: Negative.   Musculoskeletal: Negative.   Skin: Negative.   Allergic/Immunologic: Negative.   Neurological: Negative.   Hematological: Negative.   Psychiatric/Behavioral: Negative.     Physical Exam  Constitutional: She is oriented to person, place, and time. She appears well-developed and well-nourished. No distress.  Cardiovascular:  Tachy at times, irreg  Pulmonary/Chest: She has rales.  Labored, increased rate  Abdominal: Soft. Bowel sounds are normal. She exhibits no distension. There is no tenderness.  Musculoskeletal: Normal range of motion.  Generalized weakness  Neurological: She is alert and oriented to person, place, and time.  Skin: Skin  is warm and dry.  Psychiatric: She has a normal mood and affect. Her behavior is normal. Judgment and thought content normal.    Vital Signs: BP 138/66   Pulse (!) 44   Temp 97.8 F (36.6 C) (Oral)   Resp (!) 28   Ht 5' 3.6" (1.615 m)   Wt 51.6 kg (113 lb 12.1 oz)  SpO2 95%   BMI 19.77 kg/m  Pain Assessment: No/denies pain POSS *See Group Information*: 1-Acceptable,Awake and alert Pain Score: 0-No pain   SpO2: SpO2: 95 % O2 Device:SpO2: 95 % O2 Flow Rate: .O2 Flow Rate (L/min): 5 L/min  IO: Intake/output summary:  Intake/Output Summary (Last 24 hours) at 09/22/16 1115 Last data filed at 09/22/16 0700  Gross per 24 hour  Intake           1362.5 ml  Output              550 ml  Net            812.5 ml    LBM: Last BM Date: 09/18/16 Baseline Weight: Weight: 52.3 kg (115 lb 4.8 oz) Most recent weight: Weight: 51.6 kg (113 lb 12.1 oz)     Palliative Assessment/Data: PPS: 30%     Thank you for this consult. Palliative medicine will continue to follow and assist as needed.   Time In: 1000 Time Out: 1130 Time Total:90 minutes Greater than 50%  of this time was spent counseling and coordinating care related to the above assessment and plan.  Signed by: Mariana Kaufman, AGNP-C Palliative Medicine    Please contact Palliative Medicine Team phone at [phone] for questions and concerns.  For individual provider: See Shea Evans

## 2016-09-22 NOTE — Care Management Note (Signed)
Case Management Note  Patient Details  Name: Carol [name] MRN: [id number] Date of Birth: 11-05-1932  Subjective/Objective:   Pt lives with daughter and son-in-law.  Pt and family met with Palliative Care NP and determined that home with hospice is preferred option when discharged.  CM received referral to provide agencies available in [place].  Pt requested CM call son-in-law, Carol [name] [phone]), re agency preference.  After considering options, he chose [facility].                Expected Discharge Plan:  [name]  Discharge planning Services  CM Consult  Post Acute Care Choice:  Hospice Choice offered to:  Adult Children  HH Arranged:  RN, Nurse's Aide, Social Work [employer] Agency:  Hospice and [place] of Service:  In process, will continue to follow  Girard Cooter, RN 09/22/2016, 2:19 PM

## 2016-09-22 NOTE — Progress Notes (Signed)
Inpatient Diabetes Program Recommendations  AACE/ADA: New Consensus Statement on Inpatient Glycemic Control (2015)  Target Ranges:  Prepandial:   less than 140 mg/dL      Peak postprandial:   less than 180 mg/dL (1-2 hours)      Critically ill patients:  140 - 180 mg/dL   Results for Carol [name], Carol [name] (MRN [id number]) as of 09/22/2016 10:56  Ref. Range 09/20/2016 10:11 09/21/2016 09:24 09/22/2016 09:37  Glucose Latest Ref Range: 65 - 99 mg/dL 184 (H) 220 (H) 206 (H)    Review of Glycemic Control  Diabetes history: none Outpatient Diabetes medications: none Current orders for Inpatient glycemic control: none  Inpatient Diabetes Program Recommendations: If appropriate for patient's situation, please consider Novolog correction 0-9 units TIDAC when patient on steroids.  Thank you,  Windy Carina, RN, BSN Diabetes Coordinator Inpatient Diabetes Program [phone] (Team Pager)

## 2016-09-22 NOTE — Progress Notes (Signed)
PROGRESS NOTE    De Libman  BHA:[id number] DOB: September 07, 1932 DOA: 09/20/2016 PCP: [name], DO   Brief Narrative:  82 y.o.femalewith past medical history significant for COPD, oxygen dependent, non-small cell lung cancer with last radiation treatment in July 2017, coronary artery disease, aortic stenosis with associated diastolic dysfunction, hypertension, atrial fibrillation but not on anticoagulation, dyslipidemia, esophageal stricture status post dilation with persistent dysphasia. Patient was hospitalized and subsequently discharged on 09/15/2016 for acute respiratory failure due to healthcare associated pneumonia versus radiation pneumonitis. Patient required steroids during that time and was sent home with prednisone 40 mg daily as well as Augmentin. She developed respiratory distress and ultimately came back to hospital 09/30/2016. Please note that during the prior hospitalization it was recommended that she goes to skilled nursing facility but patient declined placement at that time.    Assessment & Plan:   Principal Problem:   Acute on chronic respiratory failure with hypoxia (HCC) Active Problems:   COPD (chronic obstructive pulmonary disease) (HCC)   HTN (hypertension)   Hyperlipidemia   Aortic stenosis   Oropharyngeal dysphagia   History of esophageal stricture   Non-small cell cancer of left lung (HCC)   Radiation pneumonitis (HCC)   Severe protein-calorie malnutrition (HCC)   Dehydration   Acute hypokalemia   Mild diastolic dysfunction   Physical deconditioning   Leukocytosis   Atrial fibrillation with RVR (HCC)   Hypoxia   Acute pulmonary edema (HCC)   Shortness of breath   Other chest pain   Goals of care, counseling/discussion   Palliative care by specialist   Acute hypoxic respiratory failure with hypoxia due to radiation pneumonitis +/- HCAP - Patient is on 5.5 L nasal cannula oxygen support - She was seen by critical care medicine yesterday who  recommended continuing oxygen support, steroids. They also said patient may be approaching palliative care and hospice and that there was not much left to offer - Continue Brovana nebulizer twice daily, Pulmicort nebulizer twice daily - Continue Spiriva daily  Goals of Care - Palliative care consulted - Patient three daughters and son in law present for discussion with palliative care - PT suggesting SNF - palliative approach would be appropriate  Chronic Atrial fibrillation with acute RVR - CHADsVASC score 3  - Not ananticoagulation candidate given age and fall risk - Heart rate controlled with Cardizem drip but occasionally low - will attempt to transition off drip today - Cardizem '60mg'$  PO TID - still on cardizem  - heart rate in 90s currently  Aortic stenosis - Not clinically significant at this time - Continue daily aspirin  Chronic diastolic CHF  - Appears euvolemic, compensated  - did receive lasix two days ago per PCCM note - +1162 net yesterday  Essential hypertension  - Blood pressure 138/66  Non-small cell lung cancer of left lung - Patient is status post chemotherapy and radiation treatment - Follow up w/ Oncology as outpatient  Dysphagia - History of esophageal stricture - Status post dilation, no odynophagia at this time  - Continue Protonix - Continue nutritional supplementation  Moderate protein calorie malnutrition - In the context of chronic illness - Continue nutritional supplementation  - patient reports poor PO intake, no appetite - patient voices she understands she must eat but is not hungry  Hypokalemia - Supplemented - continue to monitor - 4.0 this am   DVT prophylaxis: Lovenox SubQ Code Status: DNR/ DNI Family Communication: no family bedside Disposition Plan: unclear- palliative meeting with family today. Awaiting results of  their conversation  Consultants:   PCCM  PT  Palliative Care  Procedures:    None  Antimicrobials:   None    Subjective: Patient sitting in bed.  Voices she feels about the same today as she did yesterday.  Breathing is occasionally labored.  Heart rate in the mid 90s.  Patient's son in law and family to come in today to meet with palliative care.  Voices that she would like to go home at some point.  Attempted to eat breakfast- was able to eat a bit.  Urinating well.   Objective: Vitals:   09/22/16 0500 09/22/16 0700 09/22/16 0800 09/22/16 0826  BP:  129/71 138/66   Pulse:  84 (!) 44   Resp:  11 (!) 28   Temp: 97.8 F (36.6 C) 97.8 F (36.6 C)    TempSrc: Axillary Oral    SpO2:  100% 99% 95%  Weight: 51.6 kg (113 lb 12.1 oz)     Height:        Intake/Output Summary (Last 24 hours) at 09/22/16 1143 Last data filed at 09/22/16 0700  Gross per 24 hour  Intake           1362.5 ml  Output              550 ml  Net            812.5 ml   Filed Weights   09/19/16 0750 09/21/16 0357 09/22/16 0500  Weight: 50.1 kg (110 lb 8 oz) 50.3 kg (111 lb) 51.6 kg (113 lb 12.1 oz)    Examination:  General exam: Slight increased work of breathing Respiratory system: increased respiratory effort, pursed lip breathing Cardiovascular system: S1 & S2 heard, irregularly irregular rhythm. No JVD, murmurs, rubs, gallops or clicks. No pedal edema. Gastrointestinal system: Abdomen is nondistended, soft and nontender. No organomegaly or masses felt. Normal bowel sounds heard. Central nervous system: Alert and oriented to person, place and occasionally situation. No focal neurological deficits. Extremities: Symmetric power bilaterally but weak Skin: No rashes, lesions or ulcers Psychiatry: Mood & affect appropriate.     Data Reviewed: I have personally reviewed following labs and imaging studies  CBC:  Recent Labs Lab 09/17/2016 1238 10/01/2016 1255 09/17/16 0214 09/18/16 0318  WBC 12.3*  --  8.3 7.7  NEUTROABS 10.4*  --   --   --   HGB 11.2* 12.6 10.6* 10.0*   HCT 35.3* 37.0 33.9* 32.2*  MCV 89.1  --  89.7 90.7  PLT 231  --  210 812   Basic Metabolic Panel:  Recent Labs Lab 10/03/2016 1230  09/17/16 0214 09/18/16 0318 09/20/16 1011 09/21/16 0924 09/22/16 0937  NA  --   < > 140 139 140 140 139  K  --   < > 4.0 3.6 2.5* 2.8* 4.0  CL  --   < > 97* 100* 97* 96* 99*  CO2  --   < > 33* 30 33* 35* 30  GLUCOSE  --   < > 198* 157* 184* 220* 206*  BUN  --   < > '11 12 13 15 18  '$ CREATININE  --   < > 0.56 0.55 0.54 0.53 0.51  CALCIUM  --   < > 8.4* 8.2* 8.1* 8.1* 8.6*  MG 1.9  --   --   --   --   --   --   < > = values in this interval not displayed. GFR: Estimated Creatinine Clearance: 44.9 mL/min (  by C-G formula based on SCr of 0.51 mg/dL). Liver Function Tests:  Recent Labs Lab 10/02/2016 1238 09/17/16 0214 09/18/16 0318  AST 34 24 22  ALT 42 42 36  ALKPHOS 93 82 76  BILITOT 0.5 0.8 0.7  PROT 5.9* 5.4* 5.0*  ALBUMIN 3.0* 2.7* 2.5*   No results for input(s): LIPASE, AMYLASE in the last 168 hours. No results for input(s): AMMONIA in the last 168 hours. Coagulation Profile: No results for input(s): INR, PROTIME in the last 168 hours. Cardiac Enzymes: No results for input(s): CKTOTAL, CKMB, CKMBINDEX, TROPONINI in the last 168 hours. BNP (last 3 results)  Recent Labs  08/01/16 1048  PROBNP 106.0*   HbA1C: No results for input(s): HGBA1C in the last 72 hours. CBG: No results for input(s): GLUCAP in the last 168 hours. Lipid Profile: No results for input(s): CHOL, HDL, LDLCALC, TRIG, CHOLHDL, LDLDIRECT in the last 72 hours. Thyroid Function Tests: No results for input(s): TSH, T4TOTAL, FREET4, T3FREE, THYROIDAB in the last 72 hours. Anemia Panel: No results for input(s): VITAMINB12, FOLATE, FERRITIN, TIBC, IRON, RETICCTPCT in the last 72 hours. Sepsis Labs:  Recent Labs Lab 09/30/2016 1419  LATICACIDVEN 1.18    Recent Results (from the past 240 hour(s))  Urine culture     Status: Abnormal   Collection Time: 10/03/2016   2:45 PM  Result Value Ref Range Status   Specimen Description URINE, CLEAN CATCH  Final   Special Requests NONE  Final   Culture MULTIPLE SPECIES PRESENT, SUGGEST RECOLLECTION (A)  Final   Report Status 09/17/2016 FINAL  Final  MRSA PCR Screening     Status: None   Collection Time: 09/10/2016  7:10 PM  Result Value Ref Range Status   MRSA by PCR NEGATIVE NEGATIVE Final    Comment:        The GeneXpert MRSA Assay (FDA approved for NASAL specimens only), is one component of a comprehensive MRSA colonization surveillance program. It is not intended to diagnose MRSA infection nor to guide or monitor treatment for MRSA infections.          Radiology Studies: No results found.      Scheduled Meds: . arformoterol  15 mcg Nebulization BID  . aspirin EC  81 mg Oral Daily  . budesonide (PULMICORT) nebulizer solution  0.5 mg Nebulization BID  . diltiazem  60 mg Oral Q8H  . enoxaparin (LOVENOX) injection  40 mg Subcutaneous Q24H  . feeding supplement (ENSURE ENLIVE)  237 mL Oral BID BM  . methylPREDNISolone (SOLU-MEDROL) injection  60 mg Intravenous Q12H  . morphine  15 mg Oral Q12H  . pantoprazole  40 mg Oral Daily  . senna  1 tablet Oral QHS  . sodium chloride flush  3 mL Intravenous Q12H  . tiotropium  18 mcg Inhalation Daily   Continuous Infusions: . sodium chloride 30 mL/hr at 09/21/16 2154  . diltiazem (CARDIZEM) infusion 7.5 mg/hr (09/21/16 2327)     LOS: 5 days    Time spent: 40 minutes    Newman Pies, MD [facility] Pager [phone]  If 7PM-7AM, please contact night-coverage [email or web address] Password [email or web address] 09/22/2016, 11:43 AM

## 2016-09-22 NOTE — Progress Notes (Signed)
PT Cancellation Note  Patient Details Name: Carol [name] MRN: [id number] DOB: 09/11/1932   Cancelled Treatment:    Reason Eval/Treat Not Completed: Medical issues which prohibited therapy; patient continues with SOB and wishes to cancel for today.  Will attempt another day.   Reginia Naas 09/22/2016, 4:13 PM Magda Kiel, [place] 09/22/2016

## 2016-09-23 ENCOUNTER — Telehealth: Payer: Self-pay | Admitting: *Deleted

## 2016-09-23 DIAGNOSIS — F411 Generalized anxiety disorder: Secondary | ICD-10-CM

## 2016-09-23 DIAGNOSIS — Z7189 Other specified counseling: Secondary | ICD-10-CM

## 2016-09-23 MED ORDER — LORAZEPAM 2 MG/ML IJ SOLN
0.5000 mg | INTRAMUSCULAR | Status: DC | PRN
  Administered 2016-09-24 (×3): 0.5 mg via INTRAVENOUS
  Filled 2016-09-23 (×3): qty 1

## 2016-09-23 MED ORDER — MORPHINE SULFATE (PF) 2 MG/ML IV SOLN
1.0000 mg | INTRAVENOUS | Status: DC | PRN
  Administered 2016-09-24: 1 mg via INTRAVENOUS
  Filled 2016-09-23: qty 1

## 2016-09-23 MED ORDER — FUROSEMIDE 10 MG/ML IJ SOLN
20.0000 mg | Freq: Once | INTRAMUSCULAR | Status: AC
  Administered 2016-09-23: 20 mg via INTRAVENOUS
  Filled 2016-09-23: qty 2

## 2016-09-23 MED ORDER — LORAZEPAM 0.5 MG PO TABS: 0.5000 mg | ORAL_TABLET | ORAL | Status: DC | PRN

## 2016-09-23 NOTE — Plan of Care (Signed)
Problem: Pain Managment: Goal: General experience of comfort will improve Outcome: Progressing Patient unable to take morphine by liquid form by mouth.  Patient spit up part of it and had brown sputum possibly from diluted pudding.  MD on-call paged and had anxiety and pain medications change to IV and frequency.  Family called and coming back with patient having episodes of agonal breathing even after anxiety medication and nebulizer treatments with RT.  Family informed of patients new status and possible Morphine gtt needed for comfort with some teach back verbalized by phone.

## 2016-09-23 NOTE — Care Management Important Message (Signed)
Important Message  Patient Details  Name: Carol [name] MRN: [id number] Date of Birth: 07/14/1932   Medicare Important Message Given:  Yes    Nathen May 09/23/2016, 12:37 PM

## 2016-09-23 NOTE — Progress Notes (Signed)
Physical Therapy Treatment Patient Details Name: Carol [name] MRN: [id number] DOB: [birth year] Today's Date: 09/23/2016    History of Present Illness 82 y.o. female with past medical history significant for COPD, oxygen dependent, non-small cell lung cancer with last radiation treatment in July 2017, coronary artery disease, aortic stenosis with associated diastolic dysfunction, hypertension, atrial fibrillation but not on anticoagulation, dyslipidemia, esophageal stricture status post dilation with persistent dysphasia. Patient was hospitalized and subsequently discharged on 09/15/2016 for acute respiratory failure due to healthcare associated pneumonia versus radiation pneumonitis. Patient required steroids during that time and was sent home with prednisone 40 mg daily as well as Augmentin. She developed respiratory distress and ultimately came back to hospital 09/21/2016    PT Comments    Pt admitted with above diagnosis. Pt currently with functional limitations due to poor endurance, decr balance and decr strength.  Pt was unable to stand at EOB and only tolerated sitting EOB for 10 minutes.  Pt desat to mid 34's on 6LO2 with activity. Daughter present and very comforting to pt.  Plan is for pt to go home with Hospice per chart.  Recommend equipment below.  Family can use hoyer lift to get pt up to wheelchair. Pt will need home O2 as well.  Pt will benefit from skilled PT to increase their independence and safety with mobility to allow discharge to the venue listed below.    Follow Up Recommendations  No PT follow up;Supervision/Assistance - 24 hour (Pt Hospice care, can't get HHPT.)     Equipment Recommendations  Hospital bed;Wheelchair (measurements PT);Wheelchair cushion (measurements PT);3in1 (PT) (tray table, hoyer lift) home O2   Recommendations for Other Services OT consult     Precautions / Restrictions Precautions Precautions: Fall Precaution Comments: O2  dependent Restrictions Weight Bearing Restrictions: No    Mobility  Bed Mobility Overal bed mobility: Needs Assistance Bed Mobility: Supine to Sit     Supine to sit: Mod assist;HOB elevated;+2 for physical assistance     General bed mobility comments: assist for legs off EOB and to lift trunk upright  Transfers Overall transfer level: Needs assistance Equipment used: Rolling walker (2 wheeled);2 person hand held assist Transfers: Sit to/from Stand Sit to Stand: Total assist;+2 physical assistance;From elevated surface         General transfer comment: Two attempts to stand, once to RW and once with bil UE support with knees blocked and pt unable.  Pt very fatigued as well.  Pt was wet with urine therefore while sitting EOB changed all sheets and once pt laid back, got all wet sheets out and changed pads and gown as well and cleaned pt.  Daughter present and provided comfort to pt.  Pt desat with HR incr as well with activity.    Ambulation/Gait                 Stairs            Wheelchair Mobility    Modified Rankin (Stroke Patients Only)       Balance Overall balance assessment: Needs assistance Sitting-balance support: No upper extremity supported;Feet supported Sitting balance-Leahy Scale: Fair Sitting balance - Comments: can sit statically without UE support.  Sat EOB x 10 minutes.   Standing balance support: Bilateral upper extremity supported;During functional activity Standing balance-Leahy Scale: Zero Standing balance comment: unable to achieve full stand even with +2 total assist.  Cognition Arousal/Alertness: Awake/alert Behavior During Therapy: WFL for tasks assessed/performed Overall Cognitive Status: Within Functional Limits for tasks assessed                      Exercises      General Comments        Pertinent Vitals/Pain Pain Assessment: Faces Faces Pain Scale: Hurts little more Pain  Location: generalized Pain Descriptors / Indicators: Aching;Grimacing;Guarding Pain Intervention(s): Limited activity within patient's tolerance;Monitored during session;Repositioned  HR 105-140 bpm with activity, 151/64, O2 90-93% on 5LO2 on arrival.  O2 desat to 85% with activity therefore incr to 6LO2 with O2 sats between 87-92% with sitting EOB.  Once laid pt down, took 10 min for O2 sats on 6LO2 to recover with sats 88-93% for some time.  Made Nurse aware that sats were taking time to improve after activity.      Home Living                      Prior Function            PT Goals (current goals can now be found in the care plan section) Acute Rehab PT Goals Patient Stated Goal: To return to independent Progress towards PT goals: Not progressing toward goals - comment (limited by poor endurance and decr strength)    Frequency    Min 3X/week      PT Plan Discharge plan needs to be updated    Co-evaluation             End of Session Equipment Utilized During Treatment: Gait belt;Oxygen Activity Tolerance: Patient limited by fatigue Patient left: in bed;with call bell/phone within reach;with family/visitor present     Time: 0927-0956 PT Time Calculation (min) (ACUTE ONLY): 29 min  Charges:  $Therapeutic Activity: 8-22 mins $Self Care/Home Management: 8-22                    G CodesDenice Paradise Oct 11, 2016, 11:04 AM [facility] [phone] (pager)

## 2016-09-23 NOTE — Progress Notes (Signed)
Daily Progress Note   Patient Name: Carol [name]       Date: 09/23/2016 DOB: [birth year]  Age: 82 y.o. MRN#: [id number] Attending Physician: Wallis Bamberg, MD Primary Care Physician: [name], DO Admit Date: 09/11/2016  Reason for Consultation/Follow-up: Non pain symptom management  Subjective: Evaluated patient with family at bedside. Pt receiving nebulizer treatment. Daughter tells me patient struggled with shortness of breath and anxiety last night. Was not able to rest. Per chart review, patient was not given any PRN medications as ordered until receiving lorazepam at 0552 this morning. Family states they requested prn morphine and were told that patient could not receive. [place] in to see patient, anticipate admitting patient to Hospice services today and coordinating equipment delivery to patient's daughter's home today. Aside from SOB, patient had no other complaints.    Review of Systems  Constitutional: Positive for malaise/fatigue. Negative for chills and fever.  HENT: Negative.   Eyes: Negative.   Respiratory: Positive for cough, sputum production, shortness of breath and wheezing.   Cardiovascular: Positive for chest pain.  Gastrointestinal: Negative.   Genitourinary: Negative.   Musculoskeletal: Positive for myalgias.  Skin: Negative.   Neurological: Positive for weakness.  Endo/Heme/Allergies: Negative.   Psychiatric/Behavioral: The patient is nervous/anxious.     Length of Stay: 6  Current Medications: Scheduled Meds:  . arformoterol  15 mcg Nebulization BID  . aspirin EC  81 mg Oral Daily  . budesonide (PULMICORT) nebulizer solution  0.5 mg Nebulization BID  . diltiazem  60 mg Oral Q8H  . enoxaparin (LOVENOX) injection  40 mg Subcutaneous  Q24H  . feeding supplement (ENSURE ENLIVE)  237 mL Oral BID BM  . furosemide  20 mg Intravenous Once  . methylPREDNISolone (SOLU-MEDROL) injection  60 mg Intravenous Q12H  . morphine  15 mg Oral Q12H  . pantoprazole  40 mg Oral Daily  . senna  1 tablet Oral QHS  . sodium chloride flush  3 mL Intravenous Q12H  . tiotropium  18 mcg Inhalation Daily    Continuous Infusions: . sodium chloride 30 mL/hr at 09/21/16 2154  . diltiazem (CARDIZEM) infusion Stopped (09/22/16 1400)    PRN Meds: acetaminophen **OR** acetaminophen, diphenhydrAMINE, glycopyrrolate **OR** glycopyrrolate **OR** glycopyrrolate, haloperidol **OR** haloperidol **OR** haloperidol lactate, levalbuterol, LORazepam, LORazepam, morphine, morphine CONCENTRATE, ondansetron (ZOFRAN) IV,  polyvinyl alcohol, prochlorperazine, traZODone  Physical Exam  Constitutional: She is oriented to person, place, and time. She appears well-developed and well-nourished.  Appears uncomfortable  Cardiovascular:  Difficult to auscultate heart sounds d/t adventitious lung sounds  Pulmonary/Chest: She has wheezes. She has rales.  Labored breathing  Abdominal: Soft. Bowel sounds are normal. She exhibits no distension. There is no tenderness. There is no guarding.  Musculoskeletal: She exhibits no edema.  Neurological: She is alert and oriented to person, place, and time.  Skin: Skin is warm and dry.  Nursing note and vitals reviewed.           Vital Signs: BP (!) 137/94 (BP Location: Right Leg)   Pulse (!) 40   Temp 98 F (36.7 C) (Oral)   Resp (!) 24   Ht 5' 3.6" (1.615 m)   Wt 51.5 kg (113 lb 8.6 oz)   SpO2 91%   BMI 19.73 kg/m  SpO2: SpO2: 91 % O2 Device: O2 Device: Nasal Cannula O2 Flow Rate: O2 Flow Rate (L/min): 5 L/min  Intake/output summary:  Intake/Output Summary (Last 24 hours) at 09/23/16 1050 Last data filed at 09/23/16 0700  Gross per 24 hour  Intake            497.5 ml  Output              300 ml  Net             197.5 ml   LBM: Last BM Date: 09/18/16 (md made aware) Baseline Weight: Weight: 52.3 kg (115 lb 4.8 oz) Most recent weight: Weight: 51.5 kg (113 lb 8.6 oz)       Palliative Assessment/Data: PPS: 30%     Patient Active Problem List   Diagnosis Date Noted  . Shortness of breath   . Other chest pain   . Goals of care, counseling/discussion   . Palliative care by specialist   . Acute pulmonary edema ([place])   . Acute on chronic respiratory failure with hypoxia ([place]) 10/04/2016  . Dehydration 09/22/2016  . Acute hypokalemia 09/14/2016  . Mild diastolic dysfunction 51/01/5851  . Physical deconditioning 10/02/2016  . Leukocytosis 09/17/2016  . Atrial fibrillation with RVR ([place]) 09/12/2016  . Hypoxia 10/04/2016  . Recent treatment for HCAP (healthcare-associated pneumonia)   . Severe protein-calorie malnutrition ([place]) 09/13/2016  . COPD exacerbation ([place]) 09/10/2016  . Chest pain 09/07/2016  . Pneumonitis, radiation ([place]) 08/16/2016  . Encounter for antineoplastic chemotherapy 05/16/2016  . Non-small cell cancer of left lung ([place]) 04/25/2016  . Mass of lower lobe of left lung   . PAD (peripheral artery disease) ([place]) 03/14/2016  . Oropharyngeal dysphagia 02/22/2016  . History of esophageal stricture 02/22/2016  . H/O hiatal hernia 02/22/2016  . Coronary atherosclerosis of native coronary artery 02/19/2016  . Atherosclerosis of aorta ([place]) 02/19/2016  . Former smoker 02/10/2016  . Aortic stenosis 02/10/2016  . Hernia, hiatal 02/10/2016  . Acute renal failure (ARF) ([place]) 01/29/2016  . COPD (chronic obstructive pulmonary disease) ([place]) 01/29/2016  . HTN (hypertension) 01/29/2016  . Hyperlipidemia 01/29/2016  . Abnormal EKG 01/29/2016  . AAA (abdominal aortic aneurysm) ([place]) 01/29/2016    Palliative Care Assessment & Plan   Patient Profile: 82 y.o. female  with past medical history of COPD, NSCL (s/p chemotx and radtx July, 2017), A-fib, and radiation pneumonitits admitted  on 09/15/2016 with chest pain and SOB. Workup revealed COPD exacerbation and A-fib requiring rate control with IV cardizem with plan to transition to PO before  discharge. Pulmonology consulted and noted patient at max therapy for COPD. Palliative care consulted for [place].   Assessment/Recommendations/Plan   Spoke with daytime RN re: use of PRN medications. Please assess frequently and use PRN medications as ordered.   To be discharged home with Hospice support  Goals of Care and Additional Recommendations:  Limitations on Scope of Treatment: Avoid Hospitalization, Full Comfort Care, No Surgical Procedures and No Tracheostomy  Code Status:  DNR  Prognosis:   < 3 months dt AECOPD with therapies maximized, increasing frequency of exacerbations, patient and family desire comfort measures  Discharge Planning:  Home with Hospice  Care plan was discussed with patient and her children.  Thank you for allowing the Palliative Medicine Team to assist in the care of this patient.   Time In: 1000 Time Out: 1045 Total Time 45 mins Prolonged Time Billed No      Greater than 50%  of this time was spent counseling and coordinating care related to the above assessment and plan.  Mariana Kaufman, AGNP-C Palliative Medicine   Please contact Palliative Medicine Team phone at [phone] for questions and concerns.

## 2016-09-23 NOTE — Progress Notes (Addendum)
PROGRESS NOTE    Carol [name]  PXT:[id number] DOB: [birth year] DOA: 09/26/2016 PCP: [name], DO   Brief Narrative:  82 y.o.femalewith past medical history significant for COPD, oxygen dependent, non-small cell lung cancer with last radiation treatment in July 2017, coronary artery disease, aortic stenosis with associated diastolic dysfunction, hypertension, atrial fibrillation but not on anticoagulation, dyslipidemia, esophageal stricture status post dilation with persistent dysphasia. Patient was hospitalized and subsequently discharged on 09/15/2016 for acute respiratory failure due to healthcare associated pneumonia versus radiation pneumonitis. Patient required steroids during that time and was sent home with prednisone 40 mg daily as well as Augmentin. She developed respiratory distress and ultimately came back to hospital 09/22/2016. Please note that during the prior hospitalization it was recommended that she goes to skilled nursing facility but patient declined placement at that time.    Assessment & Plan:   Principal Problem:   Acute on chronic respiratory failure with hypoxia (HCC) Active Problems:   COPD (chronic obstructive pulmonary disease) (HCC)   HTN (hypertension)   Hyperlipidemia   Aortic stenosis   Oropharyngeal dysphagia   History of esophageal stricture   Non-small cell cancer of left lung (HCC)   Radiation pneumonitis (HCC)   Severe protein-calorie malnutrition (HCC)   Dehydration   Acute hypokalemia   Mild diastolic dysfunction   Physical deconditioning   Leukocytosis   Atrial fibrillation with RVR (HCC)   Hypoxia   Acute pulmonary edema (HCC)   Shortness of breath   Other chest pain   Goals of care, counseling/discussion   Palliative care by specialist   Acute hypoxic respiratory failure with hypoxia due to radiation pneumonitis +/- HCAP - Patient is on 5.5 L nasal cannula oxygen support - She was seen by critical care medicine yesterday who  recommended continuing oxygen support, steroids. They also said patient may be approaching palliative care and hospice and that there was not much left to offer - Continue Brovana nebulizer twice daily, Pulmicort nebulizer twice daily - Continue Spiriva daily - can offer additional dose of lasix if patient and family feel as though this could ease respiratory rate  Goals of Care - Palliative care consulted - Patient three daughters and son in law present for discussion with palliative care - comfort focused at this time  Chronic Atrial fibrillation with acute RVR - CHADsVASC score 3  - Not ananticoagulation candidate given age and fall risk - heart rate around 90s but can increase occasionally to 120s when patient moves, coughs - appears to elevate with increased respiratory rate - off cardizem drip - Cardizem '60mg'$  PO TID - heart rate in 90s currently  Aortic stenosis - Not clinically significant at this time - patient comfort focused  Chronic diastolic CHF  - appears slightly volume overloaded - did receive lasix three days ago per PCCM note - can see if patient and family would like an additional dose of lasix to see if that helps - +97.5 net yesterday  Essential hypertension  - Blood pressure 138/66  Non-small cell lung cancer of left lung - Patient is status post chemotherapy and radiation treatment - per son in law patient in remission - radiation pneumonitis likely part of significant respiratory compromise  Dysphagia - History of esophageal stricture - Status post dilation, no odynophagia at this time  - Continue Protonix - Continue nutritional supplementation - patient taking in minimal PO  Moderate protein calorie malnutrition - In the context of chronic illness - Continue nutritional supplementation  - patient reports poor  PO intake, no appetite - comfort focused   Hypokalemia - Supplemented previously - last value of 4.0 - not taking much PO  and will avoid IV as patient comfort focused - will no monitor as patient not comfort focused   DVT prophylaxis: Lovenox SubQ Code Status: DNR/ DNI Family Communication: no family bedside Disposition Plan: home with hospice services  Consultants:   PCCM  PT  Palliative Care  Procedures:   None  Antimicrobials:   None    Subjective: Patient sitting in bed. Discussed with overnight nurse and patient refused breathing treatments overnight and finally agreed to one this am.  Struggled throughout the night with breathing and maintaining oxygenation.  Patient reports that she is doing well. Denies eventful night or increased work of breathing.  Says she is feeling alright.  Discussed plan of going home with hospice services.  Patient states that she understands.  Denies anything helping with relief of her work of breathing.   Objective: Vitals:   09/23/16 0450 09/23/16 0500 09/23/16 0600 09/23/16 0700  BP:  (!) 166/81 (!) 156/88 140/77  Pulse:  88 93 81  Resp:  '19 18 12  '$ Temp:      TempSrc:      SpO2: 90% (!) 89% 90% 91%  Weight:      Height:        Intake/Output Summary (Last 24 hours) at 09/23/16 0749 Last data filed at 09/23/16 0700  Gross per 24 hour  Intake            617.5 ml  Output              300 ml  Net            317.5 ml   Filed Weights   09/21/16 0357 09/22/16 0500 09/23/16 0226  Weight: 50.3 kg (111 lb) 51.6 kg (113 lb 12.1 oz) 51.5 kg (113 lb 8.6 oz)    Examination:  General exam: Significant increased work of breathing, appears tired Respiratory system: increased respiratory effort, pursed lip breathing, rhonchi throughout, crackles at bases bilaterally Cardiovascular system: difficult to auscultate due to breath sounds, irregularly irregular heartbeat, tachycardic. 2+ edema of lower extremities bilaterally to mid shin Gastrointestinal system: Abdomen is nondistended, soft and nontender. No organomegaly or masses felt. Normal bowel sounds  heard. Central nervous system: Alert and oriented to person, place and occasionally situation. No focal neurological deficits. Extremities: Symmetric power bilaterally but weak Skin: No rashes, lesions or ulcers, occasional healing ecchymoses Psychiatry: appears withdrawn but still engaged in conversation    Data Reviewed: I have personally reviewed following labs and imaging studies  CBC:  Recent Labs Lab 09/25/2016 1238 09/17/2016 1255 09/17/16 0214 09/18/16 0318  WBC 12.3*  --  8.3 7.7  NEUTROABS 10.4*  --   --   --   HGB 11.2* 12.6 10.6* 10.0*  HCT 35.3* 37.0 33.9* 32.2*  MCV 89.1  --  89.7 90.7  PLT 231  --  210 503   Basic Metabolic Panel:  Recent Labs Lab 09/11/2016 1230  09/17/16 0214 09/18/16 0318 09/20/16 1011 09/21/16 0924 09/22/16 0937  NA  --   < > 140 139 140 140 139  K  --   < > 4.0 3.6 2.5* 2.8* 4.0  CL  --   < > 97* 100* 97* 96* 99*  CO2  --   < > 33* 30 33* 35* 30  GLUCOSE  --   < > 198* 157* 184* 220* 206*  BUN  --   < > '11 12 13 15 18  '$ CREATININE  --   < > 0.56 0.55 0.54 0.53 0.51  CALCIUM  --   < > 8.4* 8.2* 8.1* 8.1* 8.6*  MG 1.9  --   --   --   --   --   --   < > = values in this interval not displayed. GFR: Estimated Creatinine Clearance: 44.8 mL/min (by C-G formula based on SCr of 0.51 mg/dL). Liver Function Tests:  Recent Labs Lab 09/15/2016 1238 09/17/16 0214 09/18/16 0318  AST 34 24 22  ALT 42 42 36  ALKPHOS 93 82 76  BILITOT 0.5 0.8 0.7  PROT 5.9* 5.4* 5.0*  ALBUMIN 3.0* 2.7* 2.5*   No results for input(s): LIPASE, AMYLASE in the last 168 hours. No results for input(s): AMMONIA in the last 168 hours. Coagulation Profile: No results for input(s): INR, PROTIME in the last 168 hours. Cardiac Enzymes: No results for input(s): CKTOTAL, CKMB, CKMBINDEX, TROPONINI in the last 168 hours. BNP (last 3 results)  Recent Labs  08/01/16 1048  PROBNP 106.0*   HbA1C: No results for input(s): HGBA1C in the last 72 hours. CBG: No results  for input(s): GLUCAP in the last 168 hours. Lipid Profile: No results for input(s): CHOL, HDL, LDLCALC, TRIG, CHOLHDL, LDLDIRECT in the last 72 hours. Thyroid Function Tests: No results for input(s): TSH, T4TOTAL, FREET4, T3FREE, THYROIDAB in the last 72 hours. Anemia Panel: No results for input(s): VITAMINB12, FOLATE, FERRITIN, TIBC, IRON, RETICCTPCT in the last 72 hours. Sepsis Labs:  Recent Labs Lab 09/18/2016 1419  LATICACIDVEN 1.18    Recent Results (from the past 240 hour(s))  Urine culture     Status: Abnormal   Collection Time: 09/05/2016  2:45 PM  Result Value Ref Range Status   Specimen Description URINE, CLEAN CATCH  Final   Special Requests NONE  Final   Culture MULTIPLE SPECIES PRESENT, SUGGEST RECOLLECTION (A)  Final   Report Status 09/17/2016 FINAL  Final  MRSA PCR Screening     Status: None   Collection Time: 10/04/2016  7:10 PM  Result Value Ref Range Status   MRSA by PCR NEGATIVE NEGATIVE Final    Comment:        The GeneXpert MRSA Assay (FDA approved for NASAL specimens only), is one component of a comprehensive MRSA colonization surveillance program. It is not intended to diagnose MRSA infection nor to guide or monitor treatment for MRSA infections.          Radiology Studies: No results found.      Scheduled Meds: . arformoterol  15 mcg Nebulization BID  . aspirin EC  81 mg Oral Daily  . budesonide (PULMICORT) nebulizer solution  0.5 mg Nebulization BID  . diltiazem  60 mg Oral Q8H  . enoxaparin (LOVENOX) injection  40 mg Subcutaneous Q24H  . feeding supplement (ENSURE ENLIVE)  237 mL Oral BID BM  . methylPREDNISolone (SOLU-MEDROL) injection  60 mg Intravenous Q12H  . morphine  15 mg Oral Q12H  . pantoprazole  40 mg Oral Daily  . senna  1 tablet Oral QHS  . sodium chloride flush  3 mL Intravenous Q12H  . tiotropium  18 mcg Inhalation Daily   Continuous Infusions: . sodium chloride 30 mL/hr at 09/21/16 2154  . diltiazem (CARDIZEM)  infusion Stopped (09/22/16 1400)     LOS: 6 days    Time spent: 40 minutes    Newman Pies, MD [facility] Pager  [phone]  If 7PM-7AM, please contact night-coverage [email or web address] Password [email or web address] 09/23/2016, 7:49 AM

## 2016-09-23 NOTE — Progress Notes (Signed)
Per palliative note plan is now for home with hospice services  No further CSW involvement needed- signing off  Jorge Ny, [place] Social Worker [phone]

## 2016-09-23 NOTE — Telephone Encounter (Signed)
Call from [facility] Pt requesting Dr. Julien Nordmann as attending. Reviewed with MD, spoke with Dewaine Oats at [facility], Dr. Julien Nordmann has agreed to be pt attending.

## 2016-09-23 NOTE — Plan of Care (Signed)
Problem: Pain Managment: Goal: General experience of comfort will improve Outcome: Progressing Discussed with patient and family about pain and anxiety medications and plan of care for the evening with teach back displayed

## 2016-09-23 NOTE — Progress Notes (Signed)
Notified by Babette Relic, CMRN of family request for [facility] services at home after discharge. Chart and patient information currently under review to confirm hospice eligibility.  Spoke with patient, Carol [name], pt's son in law and the [place], and three daughters at bedside to initiate education related to hospice philosophy, services and team approach to care. Family verbalized understanding of the information provided. Per discussion plan is for discharge to home by ambulance.   Please send signed completed DNR form home with patient.   Patient will need prescriptions for discharge comfort medications.   DME needs discussed and family requested hospital bed and over the bed table, [facility] equipment manager Jewel Ysidro Evert notified and will contact [facility] to arrange delivery to the home.   The home address has been verified and is correct in the chart; Carol [name], son in law, to be contacted to arrange time of delivery.   [facility] aware of the above.   Completed discharge summary will need to be faxed to [facility] at [phone] when final.   Please notify [facility] when patient is ready to leave unit at discharge-call [phone].  [facility] information and contact numbers have been given to Carol [name], son in law during visit.  Above information shared with [facility], CMRN.   Please call with any questions.  Thank You,  Margaretmary Eddy, BSN, [facility] Liaison  [phone]

## 2016-09-24 MED ORDER — FAMOTIDINE IN NACL 20-0.9 MG/50ML-% IV SOLN: 20.0000 mg | INTRAVENOUS | Status: DC

## 2016-09-24 MED ORDER — MORPHINE SULFATE (PF) 2 MG/ML IV SOLN
1.0000 mg | INTRAVENOUS | Status: AC
  Administered 2016-09-24: 1 mg via INTRAVENOUS
  Filled 2016-09-24: qty 1

## 2016-09-24 MED ORDER — SODIUM CHLORIDE 0.9 % IV SOLN
1.0000 mg/h | INTRAVENOUS | Status: DC
Start: 2016-09-24 — End: 2016-09-25
  Administered 2016-09-24 (×2): 1 mg/h via INTRAVENOUS
  Filled 2016-09-24: qty 10

## 2016-09-24 MED ORDER — SCOPOLAMINE 1 MG/3DAYS TD PT72
1.0000 | MEDICATED_PATCH | TRANSDERMAL | Status: DC
  Administered 2016-09-24: 1.5 mg via TRANSDERMAL
  Filled 2016-09-24: qty 1

## 2016-09-24 MED ORDER — MORPHINE SULFATE (PF) 2 MG/ML IV SOLN: 1.0000 mg | INTRAVENOUS | Status: DC | PRN

## 2016-09-27 ENCOUNTER — Ambulatory Visit: Payer: Medicare Other | Admitting: Gastroenterology

## 2016-09-28 DIAGNOSIS — F411 Generalized anxiety disorder: Secondary | ICD-10-CM

## 2016-09-28 DIAGNOSIS — Z515 Encounter for palliative care: Secondary | ICD-10-CM

## 2016-09-28 DIAGNOSIS — Z7189 Other specified counseling: Secondary | ICD-10-CM

## 2016-10-05 NOTE — Progress Notes (Signed)
Morphine drip had 200 ml remaining and wasted with Charge RN Gerald Stabs.

## 2016-10-05 NOTE — Progress Notes (Signed)
Patient passed. Family at bedside, including daughter and son-in-law. Dr. Jearld Shines notified.  Milford Cage, RN

## 2016-10-05 NOTE — Progress Notes (Signed)
PROGRESS NOTE    Carol [name]  PHX:[id number] DOB: 1932/07/18 DOA: 09/14/2016 PCP: [name], DO   Brief Narrative:  82 y.o.femalewith past medical history significant for COPD, oxygen dependent, non-small cell lung cancer with last radiation treatment in July 2017, coronary artery disease, aortic stenosis with associated diastolic dysfunction, hypertension, atrial fibrillation but not on anticoagulation, dyslipidemia, esophageal stricture status post dilation with persistent dysphasia. Patient was hospitalized and subsequently discharged on 09/15/2016 for acute respiratory failure due to healthcare associated pneumonia versus radiation pneumonitis. Patient required steroids during that time and was sent home with prednisone 40 mg daily as well as Augmentin. She developed respiratory distress and ultimately came back to hospital 09/25/2016. Please note that during the prior hospitalization it was recommended that she goes to skilled nursing facility but patient declined placement at that time. During admission patient was seen by PCCM- last note on 10/17 states that there was not much more to offer.  Palliative care consult placed- patient family and patient met with Palliative Care and decision of comfort focused care was made.  She was evaluated by hospice and found to be appropriate for hospice services.  Overnight from 10/20-10/21 she stopped taking any PO medications.    Assessment & Plan:   Principal Problem:   Acute on chronic respiratory failure with hypoxia (HCC) Active Problems:   COPD (chronic obstructive pulmonary disease) (HCC)   HTN (hypertension)   Hyperlipidemia   Aortic stenosis   Oropharyngeal dysphagia   History of esophageal stricture   Non-small cell cancer of left lung (HCC)   Pneumonitis, radiation (HCC)   Severe protein-calorie malnutrition (HCC)   Dehydration   Acute hypokalemia   Mild diastolic dysfunction   Physical deconditioning   Leukocytosis   Atrial  fibrillation with RVR (HCC)   Hypoxia   Acute pulmonary edema (HCC)   Shortness of breath   Other chest pain   Goals of care, counseling/discussion   Palliative care by specialist  Goals of Care - Palliative care consulted - Patient three daughters and son in law present for discussion with palliative care - comfort focused at this time - starting morphine drip to ease work of breathing and as patient is no longer taking PO   Acute hypoxic respiratory failure with hypoxia due to radiation pneumonitis +/- HCAP - Patient is on 5.5 L nasal cannula oxygen support - She was seen by critical care medicine who recommended continuing oxygen support, steroids. They also said patient may be approaching palliative care and hospice and that there was not much left to offer - Continue Brovana nebulizer twice daily, Pulmicort nebulizer twice daily - Continue Spiriva daily - small dose of IV lasix given yesterday without much improvement in respiratory status - patient comfort focused care  - anticipate hospital death - agonal breathing with respirations around 4  Chronic Atrial fibrillation with acute RVR - CHADsVASC score 3  - Not ananticoagulation candidate given age and fall risk - no longer taking any PO medications - family asking for cardizem drip - will restart cardizem drip at family request to possibly help ease patient anxiety  Aortic stenosis - Not clinically significant at this time  Chronic diastolic CHF  - 1 dose of lasix yesterday - comfort focused care now  Essential hypertension  - blood pressures controlled - no longer taking PO medication  Non-small cell lung cancer of left lung - Patient is status post chemotherapy and radiation treatment - per son in law patient in remission - radiation pneumonitis  likely part of significant respiratory compromise  Dysphagia - History of esophageal stricture - Status post dilation, no odynophagia at this time  -  Continue Protonix - Continue nutritional supplementation - patient taking in minimal PO  Moderate protein calorie malnutrition - patient reports poor PO intake, no appetite - comfort focused - PO intake as toelrated  Hypokalemia - not redrawing labs - comfort focused   DVT prophylaxis: Lovenox SubQ Code Status: DNR/ DNI Family Communication: patients daughter and son in law are bedside- update on plan Disposition Plan: hospice, although hospital death is expected  Consultants:   PCCM  PT  Palliative Care  Procedures:   None  Antimicrobials:   None    Subjective: Per nursing staff patient had a difficult night- very restless, agonal breathing, hypoxic.  Patient no longer tolerating PO- food and medication just sitting in mouth.  Morphine drip to be initiated.  Patient no longer recognizing family.    Objective: Vitals:   10/11/16 0300 10/11/16 0400 Oct 11, 2016 0500 10/11/16 0600  BP:  132/86    Pulse: 90 85 (!) 132 96  Resp: (!) 6 (!) 6 (!) 22 12  Temp:      TempSrc:      SpO2: 96% (!) 81% (!) 56% (!) 88%  Weight:      Height:        Intake/Output Summary (Last 24 hours) at 10-11-2016 0749 Last data filed at 10/11/2016 0600  Gross per 24 hour  Intake              760 ml  Output                0 ml  Net              760 ml   Filed Weights   09/22/16 0500 09/23/16 0226 10/11/2016 0222  Weight: 51.6 kg (113 lb 12.1 oz) 51.5 kg (113 lb 8.6 oz) 51.4 kg (113 lb 5.1 oz)    Examination:  General exam: Significant increased work of breathing, anxious Respiratory system: increased respiratory effort, pursed lip breathing, rhonchi throughout, crackles at bases bilaterally Cardiovascular system: difficult to auscultate due to breath sounds, irregularly irregular heartbeat, tachycardic. Minimal edema Gastrointestinal system: Abdomen is nondistended, soft and nontender. No organomegaly or masses felt. Normal bowel sounds heard. Central nervous system: disoriented, could  not assess Extremities: able to move upper extremities bilaterally and spontaneously Skin: No rashes, lesions or ulcers, occasional healing ecchymoses Psychiatry: disoriented, would not engage in conversation    Data Reviewed: I have personally reviewed following labs and imaging studies  CBC:  Recent Labs Lab 09/18/16 0318  WBC 7.7  HGB 10.0*  HCT 32.2*  MCV 90.7  PLT 716   Basic Metabolic Panel:  Recent Labs Lab 09/18/16 0318 09/20/16 1011 09/21/16 0924 09/22/16 0937  NA 139 140 140 139  K 3.6 2.5* 2.8* 4.0  CL 100* 97* 96* 99*  CO2 30 33* 35* 30  GLUCOSE 157* 184* 220* 206*  BUN _0 CREATININE 0.55 0.54 0.53 0.51  CALCIUM 8.2* 8.1* 8.1* 8.6*   GFR: Estimated Creatinine Clearance: 44.8 mL/min (by C-G formula based on SCr of 0.51 mg/dL). Liver Function Tests:  Recent Labs Lab 09/18/16 0318  AST 22  ALT 36  ALKPHOS 76  BILITOT 0.7  PROT 5.0*  ALBUMIN 2.5*   No results for input(s): LIPASE, AMYLASE in the last 168 hours. No results for input(s): AMMONIA in the last 168 hours. Coagulation Profile:  No results for input(s): INR, PROTIME in the last 168 hours. Cardiac Enzymes: No results for input(s): CKTOTAL, CKMB, CKMBINDEX, TROPONINI in the last 168 hours. BNP (last 3 results)  Recent Labs  08/01/16 1048  PROBNP 106.0*   HbA1C: No results for input(s): HGBA1C in the last 72 hours. CBG: No results for input(s): GLUCAP in the last 168 hours. Lipid Profile: No results for input(s): CHOL, HDL, LDLCALC, TRIG, CHOLHDL, LDLDIRECT in the last 72 hours. Thyroid Function Tests: No results for input(s): TSH, T4TOTAL, FREET4, T3FREE, THYROIDAB in the last 72 hours. Anemia Panel: No results for input(s): VITAMINB12, FOLATE, FERRITIN, TIBC, IRON, RETICCTPCT in the last 72 hours. Sepsis Labs: No results for input(s): PROCALCITON, LATICACIDVEN in the last 168 hours.  Recent Results (from the past 240 hour(s))  Urine culture     Status: Abnormal    Collection Time: 09/29/2016  2:45 PM  Result Value Ref Range Status   Specimen Description URINE, CLEAN CATCH  Final   Special Requests NONE  Final   Culture MULTIPLE SPECIES PRESENT, SUGGEST RECOLLECTION (A)  Final   Report Status 09/17/2016 FINAL  Final  MRSA PCR Screening     Status: None   Collection Time: 09/29/2016  7:10 PM  Result Value Ref Range Status   MRSA by PCR NEGATIVE NEGATIVE Final    Comment:        The GeneXpert MRSA Assay (FDA approved for NASAL specimens only), is one component of a comprehensive MRSA colonization surveillance program. It is not intended to diagnose MRSA infection nor to guide or monitor treatment for MRSA infections.          Radiology Studies: No results found.      Scheduled Meds: . arformoterol  15 mcg Nebulization BID  . aspirin EC  81 mg Oral Daily  . budesonide (PULMICORT) nebulizer solution  0.5 mg Nebulization BID  . diltiazem  60 mg Oral Q8H  . enoxaparin (LOVENOX) injection  40 mg Subcutaneous Q24H  . [START ON 09/25/2016] famotidine (PEPCID) IV  20 mg Intravenous Q24H  . feeding supplement (ENSURE ENLIVE)  237 mL Oral BID BM  . methylPREDNISolone (SOLU-MEDROL) injection  60 mg Intravenous Q12H  . morphine  15 mg Oral Q12H  . scopolamine  1 patch Transdermal Q72H  . senna  1 tablet Oral QHS  . sodium chloride flush  3 mL Intravenous Q12H  . tiotropium  18 mcg Inhalation Daily   Continuous Infusions: . sodium chloride 10 mL/hr at 2016/10/08 0500  . diltiazem (CARDIZEM) infusion Stopped (09/22/16 1400)  . morphine       LOS: 7 days    Time spent: 40 minutes    Newman Pies, MD [facility] Pager [phone]  If 7PM-7AM, please contact night-coverage [email or web address] Password [email or web address] Oct 08, 2016, 7:49 AM

## 2016-10-05 NOTE — Progress Notes (Addendum)
Patient passed prior to shift change with family present.  Family is with the patient at this time they took her cross necklace at home on and are making arrangements for cremation at this time.

## 2016-10-05 DEATH — deceased

## 2016-10-25 ENCOUNTER — Ambulatory Visit: Payer: Medicare Other | Admitting: Radiation Oncology

## 2016-10-25 ENCOUNTER — Ambulatory Visit: Payer: Medicare Other | Admitting: Pulmonary Disease

## 2016-10-25 ENCOUNTER — Other Ambulatory Visit: Payer: Medicare Other

## 2016-11-01 ENCOUNTER — Ambulatory Visit: Payer: Medicare Other | Admitting: Internal Medicine

## 2016-11-04 NOTE — Discharge Summary (Signed)
Physician Discharge Summary  Carol [name] MWN:[id number] DOB: 1932-07-14 DOA: 09/28/2016  PCP: [name], DO  Admit date: 10/02/2016 Discharge date: 10/07/2016  Admitted From: Home Disposition:  [place]: N/a Equipment/Devices: N/a  Discharge Condition: Deceased CODE STATUS: DNR Diet recommendation: n/a  Brief/Interim Summary: 82 y.o.femalewith past medical history significant for COPD, oxygen dependent, non-small cell lung cancer with last radiation treatment in July 2017, coronary artery disease, aortic stenosis with associated diastolic dysfunction, hypertension, atrial fibrillation but not on anticoagulation, dyslipidemia, esophageal stricture status post dilation with persistent dysphasia. Patient was hospitalized and subsequently discharged on 09/15/2016 for acute respiratory failure due to healthcare associated pneumonia versus radiation pneumonitis. Patient required steroids during that time and was sent home with prednisone 40 mg daily as well as Augmentin. She developed respiratory distress and ultimately came back to hospital 09/04/2016. Please note that during the prior hospitalization it was recommended that she goes to skilled nursing facility but patient declined placement at that time. During admission patient was seen by PCCM- last note on 10/17 states that there was not much more to offer.  Palliative care consult placed- patient family and patient met with Palliative Care and decision of comfort focused care was made.  She was evaluated by hospice and found to be appropriate for hospice services.  Overnight from 10/20-10/21 she stopped taking any PO medications. Patient slowly deteriorated and died on 28-Sep-2023.    Discharge Diagnoses:  Principal Problem:   Acute on chronic respiratory failure with hypoxia (HCC) Active Problems:   COPD (chronic obstructive pulmonary disease) (HCC)   HTN (hypertension)   Hyperlipidemia   Aortic stenosis   Oropharyngeal  dysphagia   History of esophageal stricture   Non-small cell cancer of left lung (HCC)   Pneumonitis, radiation (HCC)   Severe protein-calorie malnutrition (HCC)   Dehydration   Acute hypokalemia   Mild diastolic dysfunction   Physical deconditioning   Leukocytosis   Atrial fibrillation with RVR (HCC)   Hypoxia   Acute pulmonary edema (HCC)   Shortness of breath   Other chest pain   Goals of care, counseling/discussion   Palliative care by specialist   Anxiety state   Terminal care   Advance care planning    Discharge Instructions     Medication List    ASK your doctor about these medications   albuterol 108 (90 Base) MCG/ACT inhaler Commonly known as:  PROVENTIL HFA Inhale 2 puffs into the lungs 2 (two) times daily as needed for shortness of breath.   amLODipine 5 MG tablet Commonly known as:  NORVASC Take 1 tablet (5 mg total) by mouth daily.   amoxicillin-clavulanate 875-125 MG tablet Commonly known as:  AUGMENTIN Take 1 tablet by mouth every 12 (twelve) hours. Ask about: Should I take this medication?   aspirin EC 81 MG tablet Take 81 mg by mouth every morning.   atorvastatin 10 MG tablet Commonly known as:  LIPITOR Take 1 tablet (10 mg total) by mouth daily at 6 PM. Reported on 01/29/2016   budesonide-formoterol 160-4.5 MCG/ACT inhaler Commonly known as:  SYMBICORT Inhale 2 puffs into the lungs 2 (two) times daily.   diltiazem 60 MG tablet Commonly known as:  CARDIZEM Take 1 tablet (60 mg total) by mouth 3 (three) times daily.   EX-LAX 15 MG Tabs Generic drug:  Sennosides Take 15 mg by mouth daily as needed (For constipation.).   feeding supplement (ENSURE ENLIVE) Liqd Take 237 mLs by mouth 2 (two) times daily  between meals.   furosemide 20 MG tablet Commonly known as:  LASIX Take 1 tablet (20 mg total) by mouth daily.   guaiFENesin 600 MG 12 hr tablet Commonly known as:  MUCINEX Take 1 tablet (600 mg total) by mouth 2 (two) times daily.    multivitamin with minerals Tabs tablet Take 1 tablet by mouth daily.   omeprazole 40 MG capsule Commonly known as:  PRILOSEC Take 1 capsule (40 mg total) by mouth 2 (two) times daily.   potassium chloride 10 MEQ tablet Commonly known as:  K-DUR Take 1 tablet (10 mEq total) by mouth daily.   predniSONE 20 MG tablet Commonly known as:  DELTASONE Take 2 tablets (40 mg total) by mouth daily with breakfast.   Tiotropium Bromide Monohydrate 2.5 MCG/ACT Aers Commonly known as:  SPIRIVA RESPIMAT Inhale 2 puffs into the lungs daily.       No Known Allergies  Consultations:  PCCM  Palliative Care   Procedures/Studies: Dg Chest 2 View  Result Date: 09/15/2016 CLINICAL DATA:  Shortness of breath and weakness today. History of lung cancer. EXAM: CHEST  2 VIEW COMPARISON:  Chest x-ray 09/10/2016 FINDINGS: The cardiac silhouette, mediastinal and hilar contours are within normal limits and stable. Stable tortuosity and calcification of the thoracic aorta. Stable dense scarring changes in the left mid lung. Stable appearing radiation changes in the left lung. No definite airspace consolidation or pleural effusion. The bony thorax is stable. IMPRESSION: Stable scarring and radiation changes in the left lung. No definite infiltrates or effusions. Electronically Signed   By: Marijo Sanes M.D.   On: 09/15/2016 09:40   Dg Chest 2 View  Result Date: 09/10/2016 CLINICAL DATA:  History of lung carcinoma. Chest pain, cough and congestion for 4 weeks. EXAM: CHEST  2 VIEW COMPARISON:  09/07/2016 FINDINGS: Left suprahilar opacity, with linear opacity extending to the lateral pleural margin, is stable consistent with lung cancer treatment related scarring. Lungs are hyperexpanded but otherwise clear. No pleural effusion or pneumothorax. Cardiac silhouette is top-normal in size. No mediastinal or right hilar masses. No convincing adenopathy. Skeletal structures are demineralized. There is mild depression  of the upper endplate of an upper thoracic vertebra, stable. IMPRESSION: 1. No acute cardiopulmonary disease. No change from the prior study. Electronically Signed   By: Lajean Manes M.D.   On: 09/10/2016 14:55   Ct Angio Chest Pe W And/or Wo Contrast  Result Date: 09/10/2016 CLINICAL DATA:  patient has had increased shortness of breath/chest pain/weakness over the last 3 weeks. 84% oxygen on room air. Hx of lung cancer. Last chemo/radiation was 6-7 weeks ago. EXAM: CT ANGIOGRAPHY CHEST WITH CONTRAST TECHNIQUE: Multidetector CT imaging of the chest was performed using the standard protocol during bolus administration of intravenous contrast. Multiplanar CT image reconstructions and MIPs were obtained to evaluate the vascular anatomy. CONTRAST:  100 mL of Isovue 370 intravenous contrast COMPARISON:  Current chest radiograph.  Chest CTA, 08/03/2016. FINDINGS: Cardiovascular: Study is suboptimal for evaluation of pulmonary embolus. There is less enhancement of the pulmonary arteries in of the aorta. Allowing for the above limitation, there is no convincing pulmonary embolus. The heart is normal in size and configuration. There is a stable intra atrial lipoma. There are dense coronary artery calcifications. Dense atherosclerotic calcifications are noted along the aortic arch and the arch branch vessels. There is a moderate to severe stenosis of the left common carotid artery at its origin. There is a severe stenosis versus occlusion of the left subclavian  artery. The aorta is ectatic. No aneurysm. No dissection. Mediastinum/Nodes: No mediastinal or hilar masses or adenopathy. Trachea and esophagus are unremarkable. Lungs/Pleura: Focal opacity in the superior segment left lower lobe is without significant change from the prior CT. There is a band of opacity at the level of the superior left hilum extends across the left upper lobe to the left lower lobe superior segment. This is consistent with radiation treatment  related scarring. There is also similar to the prior CT. There is additional ground-glass and coarse reticular type opacity in the left lower lobe, below the superior segment. This is new. It may reflect new scarring or pneumonia/ pneumonitis. Elsewhere, the lungs show stable areas of peripheral interstitial thickening. Changes of emphysema are stable. No pulmonary edema. No pleural effusion or pneumothorax. Upper Abdomen: No acute abnormality. Musculoskeletal: No osteoblastic or osteolytic lesions. Review of the MIP images confirms the above findings. IMPRESSION: 1. Study is suboptimal for the evaluation of pulmonary emboli with lower enhancement in the pulmonary arteries than in the aorta. Allowing for this, there is no evidence of a pulmonary embolus. 2. New area of ground-glass and reticular type opacity in the mid aspect of the left lower lobe. This may reflect infection or post radiation induced inflammation. No other evidence of an acute abnormality within the chest. 3. Focal opacity in the superior segment left lower lobe with a bandlike area of opacity across the left upper lobe and superior segment left lower lobe is stable reflecting treatment related scarring. 4. Significant atherosclerotic disease of the aorta and branch vessels and significant coronary artery calcifications. Electronically Signed   By: Lajean Manes M.D.   On: 09/10/2016 16:04   Dg Chest Port 1 View  Result Date: 09/19/2016 CLINICAL DATA:  Shortness of breath. History of radiation pneumonitis EXAM: PORTABLE CHEST 1 VIEW COMPARISON:  Chest CT September 10, 2016; chest CT September 19, 2016 FINDINGS: There is a degree of underlying emphysematous change, better demonstrated on recent CT examination. Linear scarring and atelectasis remains in the left upper lobe region. There has been interval clearing of linear atelectasis in the left lower lobe region compared to most recent of radiographic examination. There is diffuse interstitial  prominence throughout the lungs bilaterally, stable from recent studies. No new opacity is evident. Heart size and pulmonary vascularity are within normal limits. There is atherosclerotic calcification in the aorta. No adenopathy. IMPRESSION: Scarring and atelectasis in the left upper lobe region remains. There has been interval clearing of atelectatic change from more inferiorly on the left. Diffuse interstitial prominence bilaterally remains, possibly representing a degree of chronic congestive heart failure or residua of chronic inflammatory type change superimposed on emphysema. No new opacity evident. Stable cardiac silhouette. There is aortic atherosclerosis. Electronically Signed   By: Lowella Grip III M.D.   On: 09/19/2016 07:28   Dg Chest Port 1 View  Result Date: 09/25/2016 CLINICAL DATA:  Pt c/o SOB. Hx of COPD, CAD, Hiatal hernia, Hypertension, and peripheral arterial disease. Former smoker. EXAM: PORTABLE CHEST 1 VIEW COMPARISON:  09/15/2016. FINDINGS: Atherosclerotic calcification of the aortic arch and descending thoracic aorta. Bandlike scarring or atelectasis in the left suprahilar and infrahilar regions. No pleural effusion identified. Bony demineralization. Suspected emphysema. IMPRESSION: 1. Stable appearance the chest with left hilar and perihilar densities likely primarily from scarring, and atherosclerosis of the thoracic aorta. 2. Emphysema. Electronically Signed   By: Van Clines M.D.   On: 09/07/2016 12:50       Subjective: Patient expired.  Discharge Exam: Vitals:   2016-10-08 1218 10-08-2016 1600  BP: 121/77 (!) 77/64  Pulse:    Resp:  13  Temp: 97.9 F (36.6 C)    Vitals:   Oct 08, 2016 1200 10/08/16 1218 10-08-2016 1600 10/08/2016 1900  BP: 121/77 121/77 (!) 77/64   Pulse:      Resp: 18  13   Temp:  97.9 F (36.6 C)    TempSrc:  Axillary    SpO2:      Weight:    51.4 kg (113 lb 5.1 oz)  Height:        General: Pt expired Cardiovascular: no heartbeat  auscultated Respiratory: no breath sounds Abdominal: Soft,  Extremities: no edema    The results of significant diagnostics from this hospitalization (including imaging, microbiology, ancillary and laboratory) are listed below for reference.     Microbiology: No results found for this or any previous visit (from the past 240 hour(s)).   Labs: BNP (last 3 results)  Recent Labs  09/27/2016 1239  BNP 224.4*   Basic Metabolic Panel: No results for input(s): NA, K, CL, CO2, GLUCOSE, BUN, CREATININE, CALCIUM, MG, PHOS in the last 168 hours. Liver Function Tests: No results for input(s): AST, ALT, ALKPHOS, BILITOT, PROT, ALBUMIN in the last 168 hours. No results for input(s): LIPASE, AMYLASE in the last 168 hours. No results for input(s): AMMONIA in the last 168 hours. CBC: No results for input(s): WBC, NEUTROABS, HGB, HCT, MCV, PLT in the last 168 hours. Cardiac Enzymes: No results for input(s): CKTOTAL, CKMB, CKMBINDEX, TROPONINI in the last 168 hours. BNP: Invalid input(s): POCBNP CBG: No results for input(s): GLUCAP in the last 168 hours. D-Dimer No results for input(s): DDIMER in the last 72 hours. Hgb A1c No results for input(s): HGBA1C in the last 72 hours. Lipid Profile No results for input(s): CHOL, HDL, LDLCALC, TRIG, CHOLHDL, LDLDIRECT in the last 72 hours. Thyroid function studies No results for input(s): TSH, T4TOTAL, T3FREE, THYROIDAB in the last 72 hours.  Invalid input(s): FREET3 Anemia work up No results for input(s): VITAMINB12, FOLATE, FERRITIN, TIBC, IRON, RETICCTPCT in the last 72 hours. Urinalysis    Component Value Date/Time   COLORURINE YELLOW 09/22/2016 1445   APPEARANCEUR HAZY (A) 09/11/2016 1445   LABSPEC 1.012 10/03/2016 1445   PHURINE 8.0 09/20/2016 1445   GLUCOSEU NEGATIVE 10/02/2016 1445   HGBUR NEGATIVE 09/22/2016 1445   [place] 09/25/2016 1445   KETONESUR NEGATIVE 09/05/2016 1445   PROTEINUR >300 (A) 09/25/2016 1445    NITRITE NEGATIVE 09/17/2016 1445   LEUKOCYTESUR NEGATIVE 09/29/2016 1445   Sepsis Labs Invalid input(s): PROCALCITONIN,  WBC,  LACTICIDVEN Microbiology No results found for this or any previous visit (from the past 240 hour(s)).   Time coordinating discharge: Less than 30 minutes  SIGNED:   Newman Pies, MD  [facility] 10/07/2016, 5:29 PM Pager [phone] If 7PM-7AM, please contact night-coverage [email or web address] Password [email or web address]

## 2017-10-15 IMAGING — PT NM PET TUM IMG INITIAL (PI) SKULL BASE T - THIGH
8 series · 25 of 25 positions shown · non-contrast
Comparison: None

CLINICAL DATA: Initial treatment strategy for lung mass.

EXAM:
NUCLEAR MEDICINE PET SKULL BASE TO THIGH
TECHNIQUE: 5.65 mCi F-18 FDG was injected intravenously. Full-ring PET imaging
was performed from the skull base to thigh after the radiotracer. CT
data was obtained and used for attenuation correction and anatomic
localization.
FASTING BLOOD GLUCOSE:  Value: 93 mg/dl

[Series 3: pet sk_thigh ac · axial · 5.0mm · 4.07mm/px · z∈[-708,+104]mm · 4 of 204 slices shown]
[im 1/204]
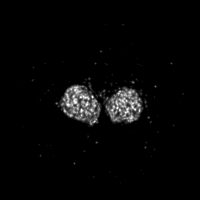
[im 68/204]
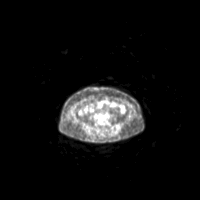
[im 136/204]
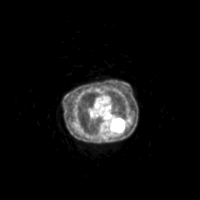
[im 204/204]
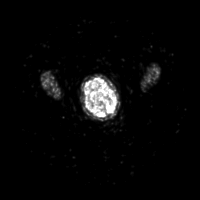

[Series 4: ct sk_thigh 5.0 b31f · axial · 5.0mm · 0.83mm/px · z∈[-708,+104]mm · 5 of 204 slices shown]
[im 1/204]
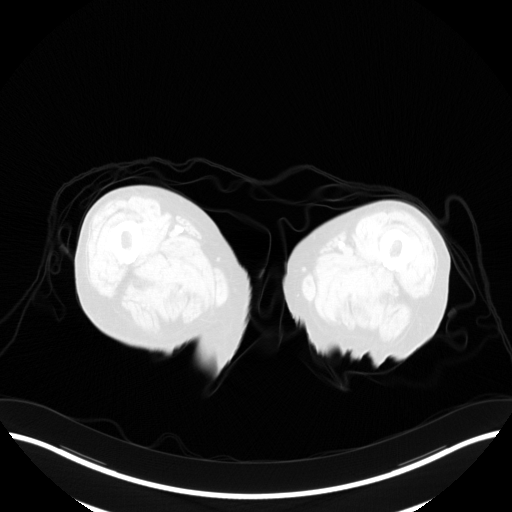
[im 51/204]
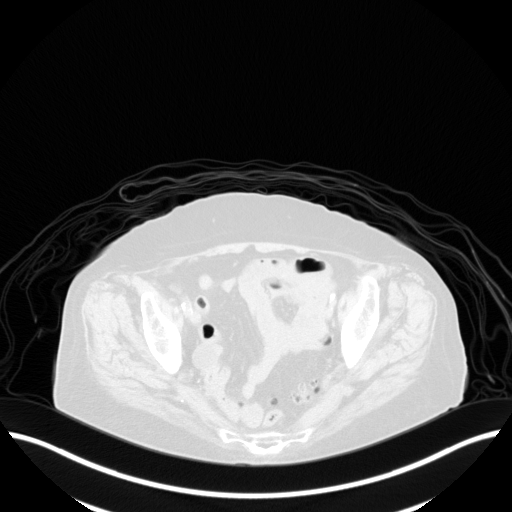
[im 102/204]
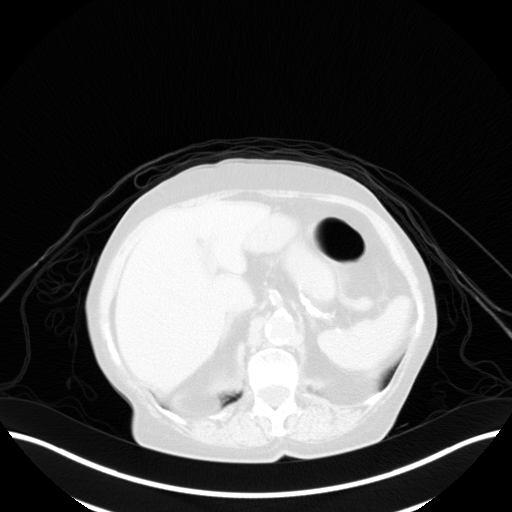
[im 153/204]
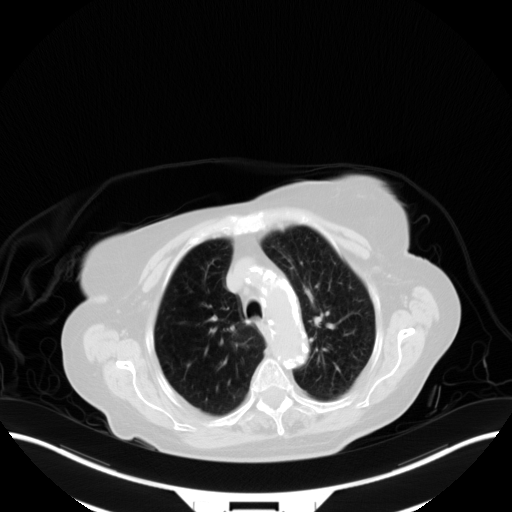
[im 204/204  brain]
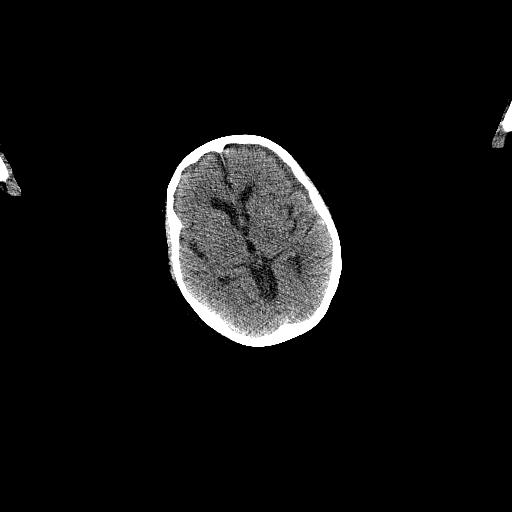

[Series 6: ct sk_thigh 5.0 b70f (id)_bone · axial · 5.0mm · 0.59mm/px · z∈[-308,-28]mm · 2 of 71 slices shown]
[im 1/71  bone]
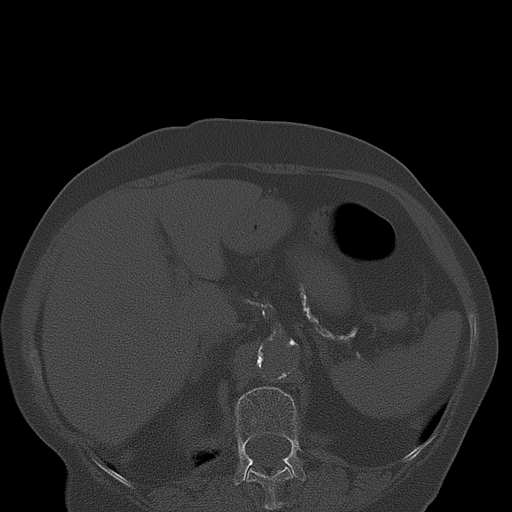
[im 71/71  bone]
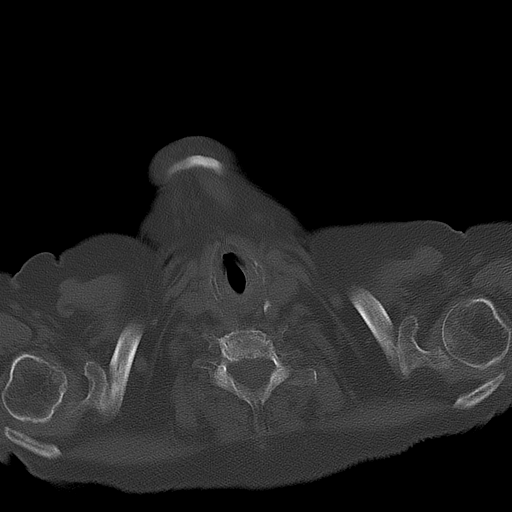

[Series 8: pet sk_thigh nac · axial · 5.0mm · 4.07mm/px · z∈[-708,+104]mm · 5 of 204 slices shown]
[im 1/204]
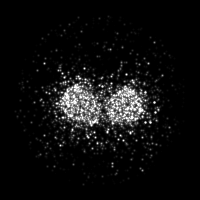
[im 51/204]
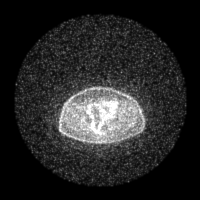
[im 102/204]
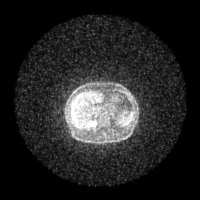
[im 153/204]
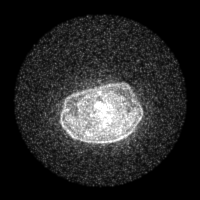
[im 204/204]
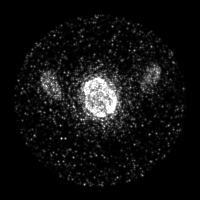

[Series 604: range-ct sk_thigh 5.0 (id)<alpha range> · 2 of 65 slices shown (1 of 2)]
[im 1/65]
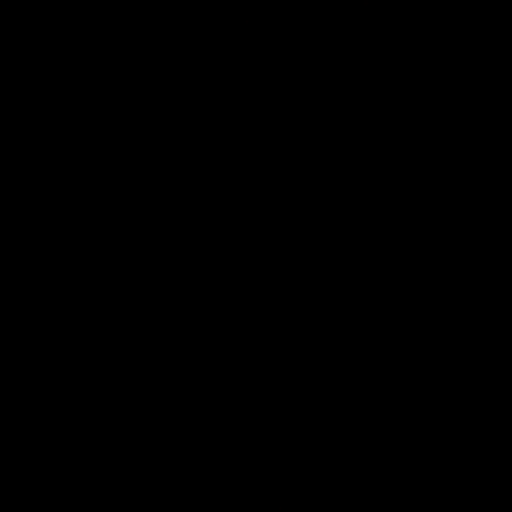
[im 65/65]
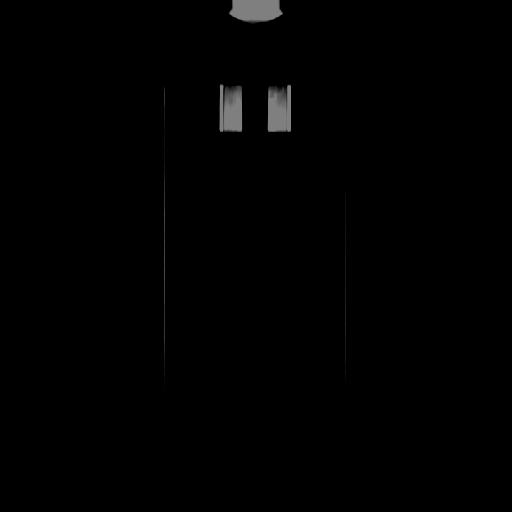

[Series 605: mip collection<mip range> · coronal · 1.69mm/px · 1 of 32 slices shown]
[im 1/32]
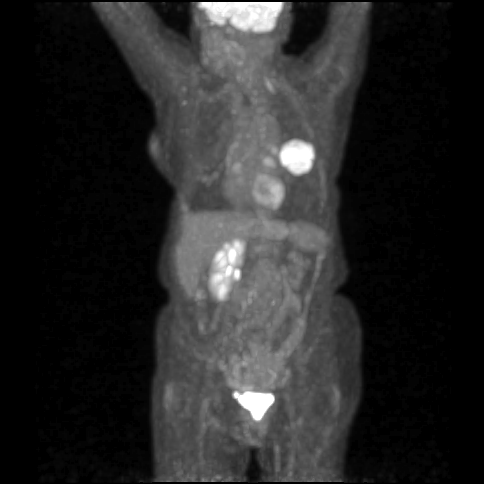

[Series 606: range-ct sk_thigh 5.0 (id)<alpha range> · 5 of 196 slices shown (2 of 2)]
[im 1/196]
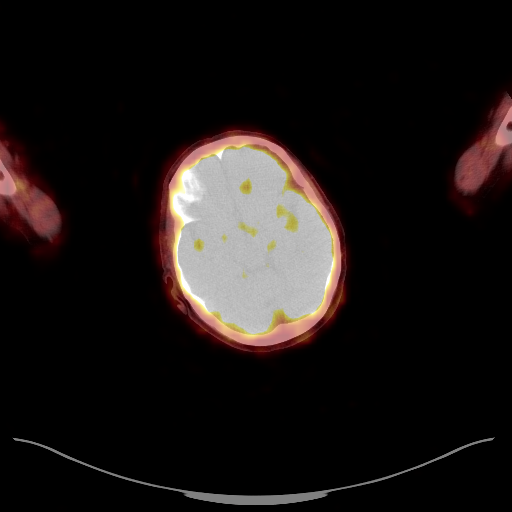
[im 49/196]
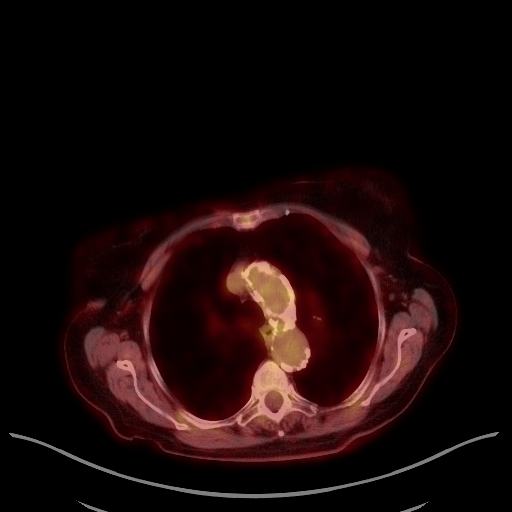
[im 98/196]
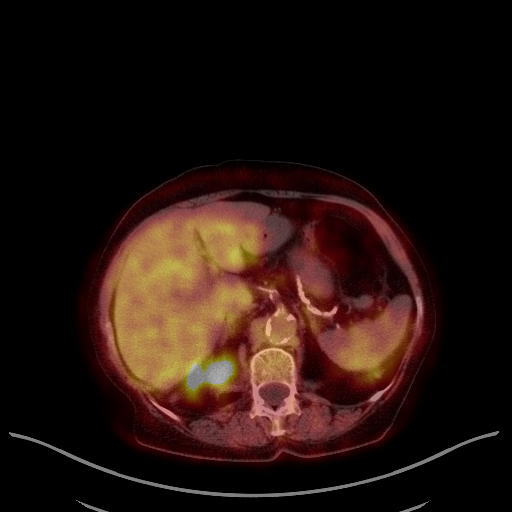
[im 147/196]
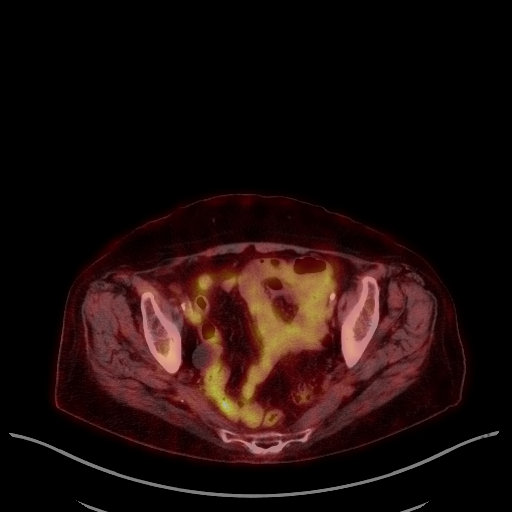
[im 196/196]
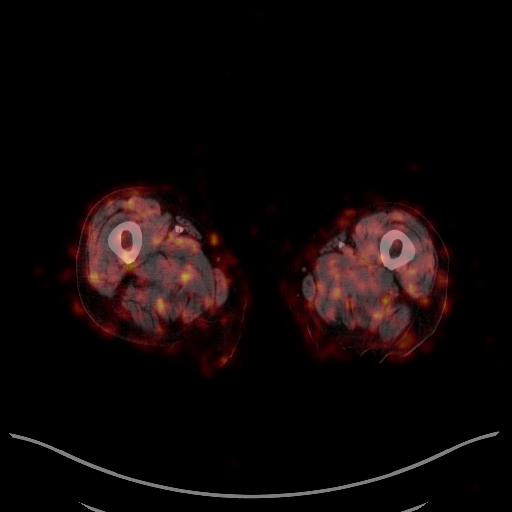

[Series 1032: results mm oncology reading · 0.85mm/px · 1 of 3 slices shown]
[im 1/3]
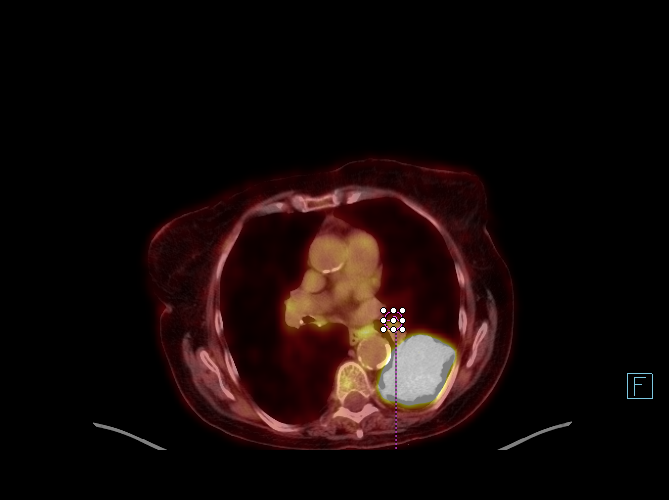

[25 of 25 positions shown; findings below may reference images not displayed]

FINDINGS: NECK

No hypermetabolic lymph nodes in the neck.

CHEST

There is aortic atherosclerosis noted. Three vessel coronary artery
calcification identified. Hypermetabolic posterior mediastinal lymph
node is identified between the left atrium and descending thoracic
aorta. This measures 9 mm and has an SUV max equal to 5.87.
Increased uptake within the left hilar region has an SUV max equal
to 4.47. Mild increased uptake associated with the right hilar node
has an SUV max equal to 3.6. Sub- carinal lymph node has an SUV max
equal to

The large mass involving the left lower lobe measures 6.5 cm and has
an SUV max equal to 16.6, image 33 of series 6. Scar versus
atelectasis noted in the right middle lobe

ABDOMEN/PELVIS

No abnormal uptake identified within the liver, spleen, pancreas or
adrenal glands. No hypermetabolic lymph nodes within the upper
abdomen. Aortic atherosclerosis noted. Infrarenal abdominal aortic
aneurysm measures 4.1 cm, image 127 of series 4.

SKELETON

No focal hypermetabolic activity to suggest skeletal metastasis.
IMPRESSION: 1. There is intense FDG uptake associated with the left lower lobe
lung mass compatible with primary bronchogenic carcinoma.
2. Hypermetabolic ipsilateral mediastinal and hilar lymph nodes
suspicious for metastatic adenopathy. There is nonspecific increased
uptake associated with the contralateral hilar node and sub- carinal
lymph node. Correlation with tissue sampling suggested.
3. No evidence for metastatic disease to the neck, abdomen or
pelvis.
4. Aortic atherosclerosis, 3 vessel coronary artery calcification an
infrarenal abdominal aortic aneurysm. Recommend followup by US in 1
year. This recommendation follows ACR consensus guidelines: White
Paper of the ACR Incidental Findings Committee II on Vascular
Findings. [HOSPITAL] [id number]; [DATE].
